# Patient Record
Sex: Female | Born: 1955 | Race: Black or African American | Hispanic: No | Smoking: Never smoker
Health system: Southern US, Community
[De-identification: ages and names within clinical notes are randomized; demographics above are authoritative.]

## PROBLEM LIST (undated history)

## (undated) DIAGNOSIS — K219 Gastro-esophageal reflux disease without esophagitis: Secondary | ICD-10-CM

## (undated) DIAGNOSIS — G4733 Obstructive sleep apnea (adult) (pediatric): Secondary | ICD-10-CM

## (undated) DIAGNOSIS — I10 Essential (primary) hypertension: Secondary | ICD-10-CM

## (undated) DIAGNOSIS — E119 Type 2 diabetes mellitus without complications: Secondary | ICD-10-CM

## (undated) HISTORY — DX: Essential (primary) hypertension: I10

## (undated) HISTORY — DX: Gastro-esophageal reflux disease without esophagitis: K21.9

## (undated) HISTORY — PX: ABDOMINAL HYSTERECTOMY: SHX81

## (undated) HISTORY — PX: TONSILECTOMY, ADENOIDECTOMY, BILATERAL MYRINGOTOMY AND TUBES: SHX2538

## (undated) HISTORY — DX: Type 2 diabetes mellitus without complications: E11.9

## (undated) HISTORY — DX: Obstructive sleep apnea (adult) (pediatric): G47.33

---

## 1999-08-16 ENCOUNTER — Emergency Department (HOSPITAL_COMMUNITY): Admission: EM | Admit: 1999-08-16 | Discharge: 1999-08-16 | Payer: Self-pay | Admitting: Emergency Medicine

## 2006-03-01 ENCOUNTER — Emergency Department (HOSPITAL_COMMUNITY): Admission: EM | Admit: 2006-03-01 | Discharge: 2006-03-01 | Payer: Self-pay | Admitting: Emergency Medicine

## 2006-03-01 ENCOUNTER — Emergency Department (HOSPITAL_COMMUNITY): Admission: EM | Admit: 2006-03-01 | Discharge: 2006-03-02 | Payer: Self-pay | Admitting: Emergency Medicine

## 2006-03-07 ENCOUNTER — Ambulatory Visit (HOSPITAL_COMMUNITY): Admission: RE | Admit: 2006-03-07 | Discharge: 2006-03-07 | Payer: Self-pay | Admitting: Orthopedic Surgery

## 2006-04-08 ENCOUNTER — Emergency Department (HOSPITAL_COMMUNITY): Admission: EM | Admit: 2006-04-08 | Discharge: 2006-04-08 | Payer: Self-pay | Admitting: Family Medicine

## 2007-10-16 ENCOUNTER — Encounter: Admission: RE | Admit: 2007-10-16 | Discharge: 2007-10-16 | Payer: Self-pay | Admitting: Obstetrics and Gynecology

## 2007-11-06 ENCOUNTER — Encounter: Payer: Self-pay | Admitting: Pulmonary Disease

## 2009-09-15 ENCOUNTER — Ambulatory Visit: Payer: Self-pay | Admitting: Internal Medicine

## 2009-09-15 ENCOUNTER — Observation Stay (HOSPITAL_COMMUNITY): Admission: EM | Admit: 2009-09-15 | Discharge: 2009-09-19 | Payer: Self-pay | Admitting: Emergency Medicine

## 2009-09-17 ENCOUNTER — Encounter (INDEPENDENT_AMBULATORY_CARE_PROVIDER_SITE_OTHER): Payer: Self-pay | Admitting: Internal Medicine

## 2009-09-17 ENCOUNTER — Ambulatory Visit: Payer: Self-pay | Admitting: Vascular Surgery

## 2009-10-10 ENCOUNTER — Ambulatory Visit: Payer: Self-pay | Admitting: Pulmonary Disease

## 2009-10-10 DIAGNOSIS — G4730 Sleep apnea, unspecified: Secondary | ICD-10-CM | POA: Insufficient documentation

## 2009-10-10 HISTORY — DX: Sleep apnea, unspecified: G47.30

## 2009-11-07 ENCOUNTER — Encounter: Payer: Self-pay | Admitting: Pulmonary Disease

## 2009-11-08 ENCOUNTER — Ambulatory Visit: Payer: Self-pay | Admitting: Pulmonary Disease

## 2009-11-24 ENCOUNTER — Encounter: Payer: Self-pay | Admitting: Internal Medicine

## 2009-11-24 ENCOUNTER — Ambulatory Visit (HOSPITAL_BASED_OUTPATIENT_CLINIC_OR_DEPARTMENT_OTHER): Admission: RE | Admit: 2009-11-24 | Discharge: 2009-11-24 | Payer: Self-pay | Admitting: Pulmonary Disease

## 2009-12-02 ENCOUNTER — Ambulatory Visit: Payer: Self-pay | Admitting: Internal Medicine

## 2010-06-21 ENCOUNTER — Telehealth: Payer: Self-pay | Admitting: Pulmonary Disease

## 2010-08-14 ENCOUNTER — Ambulatory Visit: Admit: 2010-08-14 | Payer: Self-pay | Admitting: Pulmonary Disease

## 2010-08-14 NOTE — Assessment & Plan Note (Signed)
Summary: SLEEP CONSULT/RJC   Visit Type:  Initial Consult Copy to:  Hospital doctor Primary Provider/Referring Provider:  Dr. Lelon Perla  CC:  Joan [name] here for sleep consult.  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 She underwent PSG in 4/09 due to non restorative sleep & witnessed apneas , wt 311 lbs which showed 7 OAs & 5 hypopneas over a TST of 373 mins with AHI of 1.9, RDI 2.7  & REM RDI of 8/h, nadir desaturation of 80%. She was admitted 3/4-09/19/09 for chest pain, negative treadmill stress test & found to have high sugars. She has gained 15 lbs in the last 2 yrs & now weighs 326 lbs. She was discharged on CPAP &s tates that she has a good experience with this. Shewas able to use it for 8 hrs one night & woke up refreshed.She underwent surgery for adenoids & her turbinates in 1989. 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 Bedtime is 11 p- 12mN, latency is 30 mins, TV stays on, wakes up once for BR visit, wakes up at 6 am feeling refreshed, no headaches, c/o dryness. CPAP has helped. Epworth Sleepiness Score is 2 . There is no history suggestive of cataplexy, sleep paralysis or parasomnias    Preventive Screening-Counseling & Management  Alcohol-Tobacco     Smoking Status: never   History of Present Illness: Sleep Apnea  What time do you typically go to bed?(between what hours): 11:00 or 12:00  How long does it take you to fall asleep? 30 mins to 1 hour  How many times during the night do you wake up? 1 time sometimes to use the restroom  What time do you get out of bed to start your day? 5:30 to 6:00am  Do you drive or operate heavy machinery in your occupation? a school bus  How much has your weight changed (up or down) over the past two years? (in pounds): gained weight in the past, lost 5 lbs a week ago  Have you ever had a sleep study before?  If yes,when and where: [facility]  Do you currently use CPAP ? If so , at what pressure? yes 4.0-12.0  Do you wear oxygen at any time? If yes, how many liters per minute? no Current Medications (verified): 1)  Aspirin 81 Mg  Tabs (Aspirin) .... Take 1 Tablet By Mouth Once A Day 2)  Vytorin 10-40 Mg Tabs (Ezetimibe-Simvastatin) .... Take 1 Tablet By Mouth Once A Day 3)  Hydralazine Hcl 25 Mg Tabs (Hydralazine Hcl) .... Take 1 Tablet By Mouth Three Times A Day 4)  Lisinopril-Hydrochlorothiazide 20-12.5 Mg Tabs (Lisinopril-Hydrochlorothiazide) .... 40/25mg  Once Daily 5)  Biotin Otc .... Take 1 Tablet By Mouth Once A Day 6)  Integra Plus  Caps (Fefum-Fepoly-Fa-B Cmp-C-Biot) .... Take 1 Tablet By Mouth Once A Day 7)  Naprosyn 250 Mg Tabs (Naproxen) .... Take 1 Tablet By Mouth Two Times A Day As Needed 8)  Nexium 40 Mg Cpdr (Esomeprazole Magnesium) .... Take 1 Tablet By Mouth Once A Day 9)  Pre-Natal Formula  Tabs (Prenatal Multivit-Min-Fe-Fa) .... Take 1 Tablet By Mouth Once A Day 10)  Vitamin B-12 1000 Mcg Tabs (Cyanocobalamin) .... Take 1 Tablet By Mouth Once A Day 11)  Vitamin D (Ergocalciferol) 50000 Unit Caps (Ergocalciferol) .... One Tab By Mouth Tuesday and Friday  Allergies (verified): 1)  ! Pcn  Past History:  Family History:  Last updated: 10/10/2009 Heart disease-mother, father Family History Rheumatoid Arthritis-mother Family History Prostate Cancer-father, maternal grandfather  Social History: Last updated: 10/10/2009 Marital Status: Single, lives with Peyton Najjar Frontis as a live in caretaker Children: Yes Occupation: CNA-1 and Surveyor, mining Patient never smoked.   Family History: Heart disease-mother, father Family History Rheumatoid Arthritis-mother Family History Prostate Cancer-father, maternal grandfather  Social History: Marital Status: Single, lives with Peyton Najjar Frontis as a live in caretaker Children: Yes Occupation: CNA-1 and Surveyor, mining Patient never smoked.  Smoking Status:   never  Review of Systems       The patient complains of shortness of breath with activity, acid heartburn, indigestion, tooth/dental problems, headaches, and hand/feet swelling.  The patient denies shortness of breath at rest, productive cough, non-productive cough, coughing up blood, chest pain, irregular heartbeats, loss of appetite, weight change, abdominal pain, difficulty swallowing, sore throat, nasal congestion/difficulty breathing through nose, sneezing, itching, ear ache, anxiety, depression, joint stiffness or pain, rash, change in color of mucus, and fever.    Vital Signs:  Patient profile:   55 year old female Height:      66 inches Weight:      326.25 pounds BMI:     52.85 O2 Sat:      97 % on Room air Temp:     97.7 degrees F oral Pulse rate:   71 / minute BP sitting:   126 / 80  (left arm) Cuff size:   large  Vitals Entered By: Zackery Barefoot CMA (October 10, 2009 9:56 AM)  O2 Flow:  Room air CC: Joan [name] here for sleep consult Comments Medications reviewed with patient Verified contact number and pharmacy with patient Zackery Barefoot CMA  October 10, 2009 9:56 AM    Physical Exam  Additional Exam:  Gen. Pleasant, well-nourished, in no distress, normal affect ENT - no lesions, no post nasal drip, class 3 airway Neck: No JVD, no thyromegaly, no carotid bruits Lungs: no use of accessory muscles, no dullness to percussion, clear without rales or rhonchi  Cardiovascular: Rhythm regular, heart sounds  normal, no murmurs or gallops, no peripheral edema Abdomen: soft and non-tender, no hepatosplenomegaly, BS normal. Musculoskeletal: No deformities, no cyanosis or clubbing Neuro:  alert, non focal     Impression & Recommendations:  Problem # 1:  OBSTRUCTIVE SLEEP APNEA (ICD-780.57) The pathophysiology of obstructive sleep apnea, it's cardiovascular consequences and modes of treatment including CPAP were discussed with the patient in great detail.  Reviewed pSG - does not  show significant events ,surprisingly - ? lack of supine REM sleep. Proceed with split study given weight gain. Also given occupation will need convincing eveidence. Orders: Sleep Disorder Referral (Sleep Disorder) DME Referral (DME) Consultation Level III (60454)  Medications Added to Medication List This Visit: 1)  Aspirin 81 Mg Tabs (Aspirin) .... Take 1 tablet by mouth once a day 2)  Vytorin 10-40 Mg Tabs (Ezetimibe-simvastatin) .... Take 1 tablet by mouth once a day 3)  Hydralazine Hcl 25 Mg Tabs (Hydralazine hcl) .... Take 1 tablet by mouth three times a day 4)  Lisinopril-hydrochlorothiazide 20-12.5 Mg Tabs (Lisinopril-hydrochlorothiazide) .... 40/25mg  once daily 5)  Biotin Otc  .... Take 1 tablet by mouth once a day 6)  Integra Plus Caps (Fefum-fepoly-fa-b cmp-c-biot) .... Take 1 tablet by mouth once a day 7)  Naprosyn 250 Mg Tabs (Naproxen) .... Take 1 tablet by mouth two times a day as needed 8)  Nexium 40 Mg Cpdr (Esomeprazole magnesium) .... Take 1  tablet by mouth once a day 9)  Pre-natal Formula Tabs (Prenatal multivit-min-fe-fa) .... Take 1 tablet by mouth once a day 10)  Vitamin B-12 1000 Mcg Tabs (Cyanocobalamin) .... Take 1 tablet by mouth once a day 11)  Vitamin D (ergocalciferol) 50000 Unit Caps (Ergocalciferol) .... One tab by mouth tuesday and friday  Patient Instructions: 1)  Copy sent to: Dr Allyne Gee 2)  Please schedule a follow-up appointment in 4 weeks. 3)  Keep using your CPAP machine in the meantime

## 2010-08-14 NOTE — Medication Information (Signed)
Summary: CPAP Compliance  CPAP Compliance   Imported By: Sherian Rein 11/15/2009 08:45:56  _____________________________________________________________________  External Attachment:    Type:   Image     Comment:   External Document

## 2010-08-14 NOTE — Assessment & Plan Note (Signed)
Summary: rov 4 wks ///kp   Visit Type:  Follow-up Copy to:  Hospital doctor Primary Provider/Referring Provider:  Dr. 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 She underwent PSG in 4/09 due to non restorative sleep & witnessed apneas , wt 311 lbs which showed 7 OAs & 5 hypopneas over a TST of 373 mins with AHI of 1.9, RDI 2.7  & REM RDI of 8/h, nadir desaturation of 80%. She was admitted 3/4-09/19/09 for chest pain, negative treadmill stress test & found to have high sugars. She has gained 15 lbs in the last 2 yrs & now weighs 326 lbs. She was discharged on CPAP &s tates that she has a good experience with this. Shewas able to use it for 8 hrs one night & woke up refreshed.She underwent surgery for adenoids & her turbinates in 1989. 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 Bedtime is 11 p- 12mN, latency is 30 mins, TV stays on, wakes up once for BR visit, wakes up at 6 am feeling refreshed, no headaches, c/o dryness. CPAP has helped. Epworth Sleepiness Score is 2 .  November 08, 2009 9:39 AM  reviewed download 4/6-26/11poor compliance , bedtime remains late, residual AHI 5/h acceptable Epworth Sleepiness Score 3, sleep study was rescheduled Feels better when using CPAP, more rested , not as tired in afternoons lost wt from 328  to 320 lbs    Current Medications (verified): 1)  Aspirin 81 Mg  Tabs (Aspirin) .... Take 1 Tablet By Mouth Once A Day 2)  Vytorin 10-40 Mg Tabs (Ezetimibe-Simvastatin) .... Take 1 Tablet By Mouth Once A Day 3)  Hydralazine Hcl 25 Mg Tabs (Hydralazine Hcl) .... Take 1 Tablet By Mouth Three Times A Day 4)  Lisinopril-Hydrochlorothiazide 20-12.5 Mg Tabs (Lisinopril-Hydrochlorothiazide) .... 40/25mg  Once Daily 5)  Biotin Otc .... Take 1 Tablet By Mouth Once A Day 6)  Integra Plus  Caps (Fefum-Fepoly-Fa-B Cmp-C-Biot) .... Take 1 Tablet By Mouth Once A Day 7)  Naprosyn 250 Mg Tabs  (Naproxen) .... Take 1 Tablet By Mouth Two Times A Day As Needed 8)  Nexium 40 Mg Cpdr (Esomeprazole Magnesium) .... Take 1 Tablet By Mouth Once A Day 9)  Pre-Natal Formula  Tabs (Prenatal Multivit-Min-Fe-Fa) .... Take 1 Tablet By Mouth Once A Day 10)  Vitamin B-12 1000 Mcg Tabs (Cyanocobalamin) .... Take 1 Tablet By Mouth Once A Day 11)  Vitamin D (Ergocalciferol) 50000 Unit Caps (Ergocalciferol) .... One Tab By Mouth Tuesday and Friday 12)  Jamument .... Take 1 Tab By Mouth At Bedtime  Allergies (verified): 1)  ! 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   Review of Systems  The patient denies anorexia, fever, weight loss, weight gain, vision loss, decreased hearing, hoarseness, chest pain, syncope, dyspnea on exertion, peripheral edema, prolonged cough, headaches, hemoptysis, abdominal pain, melena, hematochezia, severe indigestion/heartburn, hematuria, muscle weakness, suspicious skin lesions, difficulty walking, depression, unusual weight change, and abnormal bleeding.    Vital Signs:  Patient profile:   55 year old female Height:      66 inches Weight:      320.38 pounds O2 Sat:  97 % on Room air Temp:     97.8 degrees F oral Pulse rate:   90 / minute BP sitting:   116 / 72  (left arm) Cuff size:   large  Vitals Entered By: Carver Fila SMA (November 08, 2009 9:26 AM)  O2 Flow:  Room air Comments Meds and allergies updated Carver Fila SMA  November 08, 2009 9:31 AM    Physical Exam  Additional Exam:  Gen. Pleasant, well-nourished, in no distress, normal affect ENT - no lesions, no post nasal drip, class 3 airway Neck: No JVD, no  thyromegaly, no carotid bruits Lungs: no use of accessory muscles, no dullness to percussion, clear without rales or rhonchi  Cardiovascular: Rhythm regular, heart sounds  normal, no murmurs or gallops, no peripheral edema Musculoskeletal: No deformities, no cyanosis or clubbing     Impression & Recommendations:  Problem # 1:  OBSTRUCTIVE SLEEP APNEA (ICD-780.57) Reschedule split PSG, meanwhile ct CPAP 12 cm, residual AHI acceptable Compliance encouraged, wt loss emphasized, asked to avoid meds with sedative side effects, cautioned against driving when sleepy.  I discussed that the responsibility of ensuring that her sleepiness/ fatigue is well controlled on a daily basis lies with her as a driver.Using CPAP wil certainly help with this. Orders: Est. Patient Level III (18841) Sleep Disorder Referral (Sleep Disorder)  Medications Added to Medication List This Visit: 1)  Jamument  .... Take 1 tab by mouth at bedtime  Patient Instructions: 1)  Copy sent to: Dr Lelon Perla 2)  Please schedule a follow-up appointment in 6 weeks. 3)  Reschedule sleep study today  4)  I am sure you will be more diligent about using your CPAP machine every night , at least 4 -6 hours

## 2010-08-14 NOTE — Progress Notes (Signed)
Summary: needs f/u appt   Phone Note Call from Patient Call back at Home Phone [phone]   Caller: Patient Call For: Circe Chilton Summary of Call: pt wants to make a f/u appt w/ RA since she hasn't been seen in a while. (pt states she's not having any problems). 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 pls advise.   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 Pt ok withthis location so appt set for 10:30 on 08-14-09. Appt reminder mailed to pt. Carron Curie CMA  June 21, 2010 12:06 PM

## 2010-09-03 ENCOUNTER — Ambulatory Visit: Payer: Self-pay | Admitting: Pulmonary Disease

## 2010-09-04 ENCOUNTER — Telehealth: Payer: Self-pay | Admitting: Pulmonary Disease

## 2010-09-11 NOTE — Progress Notes (Signed)
Summary: nos appt  Phone Note Call from Patient   Caller: [email or web address]  Call For: Kasidee Voisin Summary of Call: [facility]sc nos from 2/20 to 3/26. Initial call taken by: Darletta Moll,  September 04, 2010 10:19 AM

## 2010-10-03 ENCOUNTER — Encounter: Payer: Self-pay | Admitting: Pulmonary Disease

## 2010-10-07 LAB — BASIC METABOLIC PANEL
BUN: 14 mg/dL (ref 6–23)
CO2: 26 mEq/L (ref 19–32)
Calcium: 9.2 mg/dL (ref 8.4–10.5)
Chloride: 106 mEq/L (ref 96–112)
Creatinine, Ser: 0.71 mg/dL (ref 0.4–1.2)
GFR calc Af Amer: >60 mL/min (ref >60)
GFR calc non Af Amer: >60 mL/min (ref >60)
Glucose, Bld: 91 mg/dL (ref 70–99)
Potassium: 3.5 mEq/L (ref 3.5–5.1)
Sodium: 141 mEq/L (ref 135–145)

## 2010-10-07 LAB — CK TOTAL AND CKMB (NOT AT ARMC)
CK, MB: 1.2 ng/mL (ref 0.3–4.0)
CK, MB: 1.3 ng/mL (ref 0.3–4.0)
CK, MB: 1.4 ng/mL (ref 0.3–4.0)
CK, MB: 1.4 ng/mL (ref 0.3–4.0)
Relative Index: 1.2 (ref 0.0–2.5)
Relative Index: 1.3 (ref 0.0–2.5)
Relative Index: 1.3 (ref 0.0–2.5)
Relative Index: 1.4 (ref 0.0–2.5)
Total CK: 101 U/L (ref 7–177)
Total CK: 101 U/L (ref 7–177)
Total CK: 102 U/L (ref 7–177)
Total CK: 112 U/L (ref 7–177)

## 2010-10-07 LAB — GLUCOSE, CAPILLARY
Glucose-Capillary: 100 mg/dL — ABNORMAL HIGH (ref 70–99)
Glucose-Capillary: 101 mg/dL — ABNORMAL HIGH (ref 70–99)
Glucose-Capillary: 104 mg/dL — ABNORMAL HIGH (ref 70–99)
Glucose-Capillary: 105 mg/dL — ABNORMAL HIGH (ref 70–99)
Glucose-Capillary: 108 mg/dL — ABNORMAL HIGH (ref 70–99)
Glucose-Capillary: 111 mg/dL — ABNORMAL HIGH (ref 70–99)
Glucose-Capillary: 114 mg/dL — ABNORMAL HIGH (ref 70–99)
Glucose-Capillary: 119 mg/dL — ABNORMAL HIGH (ref 70–99)
Glucose-Capillary: 121 mg/dL — ABNORMAL HIGH (ref 70–99)
Glucose-Capillary: 123 mg/dL — ABNORMAL HIGH (ref 70–99)
Glucose-Capillary: 133 mg/dL — ABNORMAL HIGH (ref 70–99)
Glucose-Capillary: 75 mg/dL (ref 70–99)
Glucose-Capillary: 81 mg/dL (ref 70–99)
Glucose-Capillary: 89 mg/dL (ref 70–99)
Glucose-Capillary: 92 mg/dL (ref 70–99)
Glucose-Capillary: 99 mg/dL (ref 70–99)

## 2010-10-07 LAB — TROPONIN I
Troponin I: 0.01 ng/mL (ref 0.00–0.06)
Troponin I: 0.03 ng/mL (ref 0.00–0.06)
Troponin I: 0.03 ng/mL (ref 0.00–0.06)

## 2010-10-07 LAB — DIFFERENTIAL
Basophils Absolute: 0 10*3/uL (ref 0.0–0.1)
Basophils Relative: 1 % (ref 0–1)
Eosinophils Absolute: 0.1 10*3/uL (ref 0.0–0.7)
Eosinophils Relative: 2 % (ref 0–5)
Lymphocytes Relative: 30 % (ref 12–46)
Lymphs Abs: 1.9 10*3/uL (ref 0.7–4.0)
Monocytes Absolute: 0.5 10*3/uL (ref 0.1–1.0)
Monocytes Relative: 8 % (ref 3–12)
Neutro Abs: 3.6 10*3/uL (ref 1.7–7.7)
Neutrophils Relative %: 59 % (ref 43–77)

## 2010-10-07 LAB — IRON AND TIBC
Iron: 36 ug/dL — ABNORMAL LOW (ref 42–135)
Saturation Ratios: 15 % — ABNORMAL LOW (ref 20–55)
TIBC: 237 ug/dL — ABNORMAL LOW (ref 250–470)
UIBC: 201 ug/dL

## 2010-10-07 LAB — HEMOGLOBIN A1C
Hgb A1c MFr Bld: 6.7 % — ABNORMAL HIGH (ref 4.6–6.1)
Mean Plasma Glucose: 146 mg/dL

## 2010-10-07 LAB — RETICULOCYTES
RBC.: 3.91 MIL/uL (ref 3.87–5.11)
Retic Count, Absolute: 50.8 10*3/uL (ref 19.0–186.0)
Retic Ct Pct: 1.3 % (ref 0.4–3.1)

## 2010-10-07 LAB — POCT CARDIAC MARKERS
CKMB, poc: 1.1 ng/mL (ref 1.0–8.0)
CKMB, poc: 1.2 ng/mL (ref 1.0–8.0)
Myoglobin, poc: 104 ng/mL (ref 12–200)
Myoglobin, poc: 73.4 ng/mL (ref 12–200)
Troponin i, poc: <0.05 ng/mL (ref 0.00–0.09)
Troponin i, poc: <0.05 ng/mL (ref 0.00–0.09)

## 2010-10-07 LAB — D-DIMER, QUANTITATIVE: D-Dimer, Quant: 0.52 ug/mL-FEU — ABNORMAL HIGH (ref 0.00–0.48)

## 2010-10-07 LAB — LIPID PANEL
Cholesterol: 196 mg/dL (ref 0–200)
HDL: 48 mg/dL (ref >39)
LDL Cholesterol: 138 mg/dL — ABNORMAL HIGH (ref 0–99)
Total CHOL/HDL Ratio: 4.1 RATIO
Triglycerides: 50 mg/dL (ref <150)
VLDL: 10 mg/dL (ref 0–40)

## 2010-10-07 LAB — CBC
HCT: 32.3 % — ABNORMAL LOW (ref 36.0–46.0)
Hemoglobin: 10.9 g/dL — ABNORMAL LOW (ref 12.0–15.0)
MCHC: 33.8 g/dL (ref 30.0–36.0)
MCV: 83.2 fL (ref 78.0–100.0)
Platelets: 215 10*3/uL (ref 150–400)
RBC: 3.88 MIL/uL (ref 3.87–5.11)
RDW: 13.5 % (ref 11.5–15.5)
WBC: 6.1 10*3/uL (ref 4.0–10.5)

## 2010-10-07 LAB — TSH: TSH: 1.082 u[IU]/mL (ref 0.350–4.500)

## 2010-10-07 LAB — FERRITIN: Ferritin: 169 ng/mL (ref 10–291)

## 2010-10-07 LAB — FOLATE: Folate: 18.5 ng/mL

## 2010-10-07 LAB — VITAMIN B12: Vitamin B-12: 573 pg/mL (ref 211–911)

## 2010-10-07 LAB — BRAIN NATRIURETIC PEPTIDE: Pro B Natriuretic peptide (BNP): <30 pg/mL (ref 0.0–100.0)

## 2010-10-08 ENCOUNTER — Ambulatory Visit: Payer: Self-pay | Admitting: Pulmonary Disease

## 2010-11-30 NOTE — Op Note (Signed)
NAME:  Joan [name], Joan [name]            ACCOUNT NO.:  [id number]   MEDICAL RECORD NO.:  [id number]          PATIENT TYPE:  AMB   LOCATION:  SDS                          FACILITY:  [facility]   PHYSICIAN:  Artist Pais. Weingold, M.D.DATE OF BIRTH:  Mar 11, 1956   DATE OF PROCEDURE:  DATE OF DISCHARGE:  03/07/2006                                 OPERATIVE REPORT   PREOPERATIVE DIAGNOSES:  Left distal radius fracture with carpal tunnel  syndrome.   POSTOPERATIVE DIAGNOSES:  Left distal radius fracture with carpal tunnel  syndrome.   PROCEDURE:  Open reduction and internal fixation with DVR plate and screw  and left carpal tunnel release through separate incision.   SURGEON:  Dairl Ponder, M.D.   ASSISTANT:  None.   ANESTHESIA:  General.   TOURNIQUET TIME:  55 minutes   COMPLICATIONS:  None.   DRAINS:  None.   OPERATIVE REPORT:  Patient was taken to the operating room after the  induction of adequate general anesthesia.  Left upper extremity is prepped  and draped in the sterile fashion.  An Esmarch was used to exsanguinate the  limb.  Tourniquet was then inflated to 250 mmHg.  At this point in time, a  longitudinal incision was made at the flexed carpal radialis tendon.  The  skin was incised.  Sheath over the FCR was incised.  The FCR was retracted  to the midline of the radial artery of the lateral side.  The fascia  underlying this was incised.  Dissection was carried out level of the  pronator quadratus.  Once this was done, the pronator quadratus was  subperiosteally stripped off the distal radius exposing a comminuted  minimally displaced intra-articular fracture of the distal radius.  It was  reduced after the fracture fragments were realigned and clot debrided from  the fracture site.  The DVR plate was then fastened to the lower aspect of  the distal radius, fixed through the slotted whole in the plate with a  cortical screw.  Intraoperative fluoroscopy revealed adequate  reduction in  both the AP, lateral and oblique view.  The remaining cortical screws were  filled proximally and the seven smooth pegs distally under direct and  fluoroscopic guidance.  At the end of the procedure, fluoroscopy revealed  anatomic reduction on both the AP, lateral and oblique view.  The wound was  irrigated and loosely closed in layers with 0 Vicryl for the quadratus and 3-  0 Prolene subcuticular stitch on the skin.  A second incision was made in  the palmar aspect of the left hand in the long thumb metacarpal.  Starting  in Kaplan's cardinal line, skin was incised.  A palmar fascia was identified  and split.  The distal edge of the transverse carpal ligament was identified  and split with a 15-blade.  The medial nerve was identified, protected and  freed with the elevator.  The main aspects of the transverse carpal ligament  were divided under direct vision using  curved blunt scissors.  The canal was inspected.  There were no osseous  lesions or gangrenous lesions present, and it was  irrigated and loosely  closed with 3-0 Prolene subcuticular stitch.  Steri-Strips, 4x4's, fluffs,  and a volar splint were applied.  Patient tolerated the procedure well and  went to recovery in stable fashion.      Artist Pais Mina Marble, M.D.  Electronically Signed     MAW/MEDQ  D:  03/07/2006  T:  03/08/2006  Job:  [id number]

## 2011-01-29 ENCOUNTER — Other Ambulatory Visit: Payer: Self-pay | Admitting: Obstetrics and Gynecology

## 2011-01-29 DIAGNOSIS — N63 Unspecified lump in unspecified breast: Secondary | ICD-10-CM

## 2011-02-01 ENCOUNTER — Other Ambulatory Visit: Payer: Self-pay

## 2011-02-08 ENCOUNTER — Other Ambulatory Visit: Payer: Self-pay

## 2011-06-20 ENCOUNTER — Ambulatory Visit: Payer: Self-pay | Admitting: Pulmonary Disease

## 2011-07-18 ENCOUNTER — Encounter: Payer: Self-pay | Admitting: Pulmonary Disease

## 2011-07-18 ENCOUNTER — Ambulatory Visit (INDEPENDENT_AMBULATORY_CARE_PROVIDER_SITE_OTHER): Payer: Self-pay | Admitting: Pulmonary Disease

## 2011-07-18 VITALS — BP 110/70 | HR 74 | Temp 98.1°F | Ht 66.0 in | Wt 323.0 lb

## 2011-07-18 DIAGNOSIS — G4730 Sleep apnea, unspecified: Secondary | ICD-10-CM

## 2011-07-18 DIAGNOSIS — Z23 Encounter for immunization: Secondary | ICD-10-CM

## 2011-07-18 NOTE — Progress Notes (Signed)
  Subjective:    Patient ID: Joan [name], female    DOB: June 12, 1956, 56 y.o.   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 She underwent PSG in 4/09 due to non restorative sleep & witnessed apneas , wt 311 lbs which showed 7 OAs & 5 hypopneas over a TST of 373 mins with AHI of 1.9, RDI 2.7  & REM RDI of 8/h, nadir desaturation of 80%. She was admitted 3/4-09/19/09 for chest pain, negative treadmill stress test & found to have high sugars. She was discharged on CPAP.She underwent surgery for adenoids & her turbinates in 1989. 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 Wt 328 Bedtime is 11 p- 12mN, latency is 30 mins, TV stays on, wakes up once for BR visit, wakes up at 6 am feeling refreshed, no headaches, c/o dryness. CPAP has helped. Epworth Sleepiness Score is 2 .  November 08, 2009  -wt 320 reviewed download 4/6-26/11poor compliance , bedtime remains late, residual AHI 5/h acceptable Epworth Sleepiness Score 3 Feels better when using CPAP, more rested , not as tired in afternoons PSG repeated 5/11 >> mild OSA, predominant RERAs, RDI 13/h - CPAP not applied  07/18/2011 wt - 323 lbs Admits to poor compliance with CPAP - 'So much going on' Denies sleepiness while driving, just got a new mask, pressure ok No dryness. Admits to poor compliance with diabetes pills.    Review of Systems Patient denies significant dyspnea,cough, hemoptysis,  chest pain, palpitations, pedal edema, orthopnea, paroxysmal nocturnal dyspnea, lightheadedness, nausea, vomiting, abdominal or  leg pains      Objective:   Physical Exam Gen. Pleasant, obese, in no distress ENT - no lesions, no post nasal drip Neck: No JVD, no thyromegaly, no carotid bruits Lungs: no use of accessory muscles, no dullness to percussion, decreased without rales or rhonchi  Cardiovascular: Rhythm regular, heart sounds  normal, no murmurs or gallops, no peripheral  edema Musculoskeletal: No deformities, no cyanosis or clubbing , no tremors        Assessment & Plan:

## 2011-07-18 NOTE — Patient Instructions (Signed)
It is your responsibility to use your CPAP machine Your sleepiness may vary on a daily basis & you are the best judge of your ability to drive We will check download on your machine in 1  month

## 2011-07-18 NOTE — Assessment & Plan Note (Addendum)
She has only mild obstructive sleep apnea by PSG 5/11 & does not seem to have excessive daytime somnolence by history. It is your responsibility to use your CPAP machine Your sleepiness may vary on a daily basis & you are the best judge of your ability to drive We will check download on your machine in 1  Month Weight loss encouraged, compliance with goal of at least 4-6 hrs every night is the expectation. Advised against medications with sedative side effects Cautioned against driving when sleepy - understanding that sleepiness will vary on a day to day basis

## 2012-01-30 ENCOUNTER — Encounter: Payer: Self-pay | Admitting: Pulmonary Disease

## 2012-01-30 ENCOUNTER — Ambulatory Visit (INDEPENDENT_AMBULATORY_CARE_PROVIDER_SITE_OTHER): Payer: BC Managed Care – PPO | Admitting: Pulmonary Disease

## 2012-01-30 VITALS — BP 132/76 | HR 81 | Temp 98.0°F | Ht 66.0 in | Wt 328.0 lb

## 2012-01-30 DIAGNOSIS — G4730 Sleep apnea, unspecified: Secondary | ICD-10-CM

## 2012-01-30 NOTE — Assessment & Plan Note (Addendum)
Trial of nasal spray -nasonex - each nare at bedtime We will change to auto cpap 5-12, mask seems ok Weight loss encouraged,CPAP  compliance with goal of at least 4-6 hrs every night is the expectation. Caution against driving when sleepy - understanding that sleepiness will vary on a day to day basis Maybe we should explore oral appliance if she cannot tolerate cpap

## 2012-01-30 NOTE — Progress Notes (Signed)
  Subjective:    Patient ID: Joan [name], female    DOB: [birth year], 56 y.o.   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  She underwent PSG in 4/09 due to non restorative sleep & witnessed apneas , wt 311 lbs which showed 7 OAs & 5 hypopneas over a TST of 373 mins with AHI of 1.9, RDI 2.7 & REM RDI of 8/h, nadir desaturation of 80%.  She was admitted 3/4-09/19/09 for chest pain, negative treadmill stress test & found to have high sugars. She was discharged on CPAP.She underwent surgery for adenoids & her turbinates in 1989. 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 Bedtime is 11 p- 12mN, latency is 30 mins, TV stays on, wakes up once for BR visit, wakes up at 6 am feeling refreshed, no headaches, c/o dryness  download 4/6-26/11poor compliance , bedtime remains late, residual AHI 5/h acceptable  PSG repeated 5/11 >> mild OSA, predominant RERAs, RDI 13/h - CPAP not applied    01/30/2012 75m FU Download shows poor compliance Patient states is unable to wear cpap every night due to pressure. States the pressure is causing chest pain and a little chest tightness School is off now, doe snot sleep till late, can nap in daytime Husband notes loud snoring She is having menopausal symptoms Has gained about 10 lbs since last visit   Review of Systems Patient denies significant dyspnea,cough, hemoptysis,  chest pain, palpitations, pedal edema, orthopnea, paroxysmal nocturnal dyspnea, lightheadedness, nausea, vomiting, abdominal or  leg pains      Objective:   Physical Exam  Gen. Pleasant, obese, in no distress ENT - no lesions, no post nasal drip Neck: No JVD, no thyromegaly, no carotid bruits Lungs: no use of accessory muscles, no dullness to percussion, decreased without rales or rhonchi  Cardiovascular: Rhythm regular, heart sounds  normal, no murmurs or gallops, no peripheral edema Musculoskeletal: No deformities,  no cyanosis or clubbing , no tremors       Assessment & Plan:

## 2012-01-30 NOTE — Patient Instructions (Addendum)
Trial of nasal spray -nasonex - each nare at bedtime We will change to auto settings Weight loss encouraged,CPAP  compliance with goal of at least 4-6 hrs every night is the expectation. Caution against driving when sleepy - understanding that sleepiness will vary on a day to day basis

## 2012-03-05 ENCOUNTER — Telehealth: Payer: Self-pay | Admitting: Pulmonary Disease

## 2012-03-05 NOTE — Telephone Encounter (Signed)
Will forward to RA as FYI 

## 2012-03-05 NOTE — Telephone Encounter (Signed)
This msg per Bjorn Loser (msg faxed to [facility])- Order was sent to [facility] 01-30-12 to change cpap to auto 5-12 cm and dowload in 4 wks. Per caller- pt has not brought unit in for a pressure change. It has been over a month and [facility] has talked to her many times and emailed (pt responded to emails) but has not yet brought machine in to have pressure changed. Hazel Sams

## 2012-04-02 ENCOUNTER — Other Ambulatory Visit: Payer: Self-pay

## 2012-04-02 ENCOUNTER — Encounter: Payer: Self-pay | Admitting: Obstetrics and Gynecology

## 2012-04-15 ENCOUNTER — Ambulatory Visit: Payer: Self-pay | Admitting: Obstetrics and Gynecology

## 2012-05-13 ENCOUNTER — Encounter: Payer: Self-pay | Admitting: Obstetrics and Gynecology

## 2012-05-13 ENCOUNTER — Ambulatory Visit (INDEPENDENT_AMBULATORY_CARE_PROVIDER_SITE_OTHER): Payer: BC Managed Care – PPO | Admitting: Obstetrics and Gynecology

## 2012-05-13 VITALS — BP 124/80 | Ht 66.0 in | Wt 330.0 lb

## 2012-05-13 DIAGNOSIS — Z124 Encounter for screening for malignant neoplasm of cervix: Secondary | ICD-10-CM

## 2012-05-13 NOTE — Patient Instructions (Signed)
Breast Self-Exam A self breast exam may help you find changes or problems while they are still [place]. Do a breast self-exam:  Every month.  One week after your period (menstrual period).  On the first day of each month if you do not have periods anymore. Look for any:  Change in breast color, size, or shape.  Dimples in your breast.  Changes in your nipples or skin.  Dry skin on your breasts or nipples.  Watery or bloody discharge from your nipples.  Feel for:  Lumps.  Thick, hard places.  Any other changes. HOME CARE There are 3 ways to do the breast self-exam: In front of a mirror.  Lift your arms over your head and turn side to side.  Put your hands on your hips and lean down, then turn from side to side.  Bend forward and turn from side to side. In the shower.  With soapy hands, check both breasts. Then check above and below your collarbone and your armpits.  Feel above and below your collarbone down to under your breast, and from the center of your chest to the outer edge of the armpit. Check for any lumps or hard spots.  Using the tips of your middle three fingers check your whole breast by pressing your hand over your breast in a circle or in an up and down motion. Lying down.  Lie flat on your bed.  Put a Roethler pillow under the breast you are going to check. On that same side, put your hand behind your head.  With your other hand, use the 3 middle fingers to feel the breast.  Move your fingers in a circle around the breast. Press firmly over all parts of the breast to feel for any lumps. GET HELP RIGHT AWAY IF: You find any changes in your breasts so they can be checked. Document Released: 12/18/2007 Document Revised: 09/23/2011 Document Reviewed: 10/19/2008 Exit[employer] Patient Information 2013 [employer].  

## 2012-05-13 NOTE — Progress Notes (Signed)
Last Pap: 2012 WNL: Yes Regular Periods:no Contraception: n/a  Monthly Breast exam:yes Tetanus<51yrs:yes Nl.Bladder Function:yes Daily BMs:yes Healthy Diet:yes Calcium:yes Mammogram:no Date of Mammogram: n/a Exercise:no Have often Exercise: occ Seatbelt: yes Abuse at home: no Stressful work:no Sigmoid-colonoscopy: n/a Bone Density: No PCP: Dr. Allyne Gee Change in PMH: NO change Change in FMH:NO change BP 124/80  Ht 5\' 6"  (1.676 m)  Wt 330 lb (149.687 kg)  BMI 53.26 kg/m2 Pt with complaints:no Physical Examination: General appearance - alert, well appearing, and in no distress Mental status - normal mood, behavior, speech, dress, motor activity, and thought processes Neck - supple, no significant adenopathy,  thyroid exam: thyroid is normal in size without nodules or tenderness Chest - clear to auscultation, no wheezes, rales or rhonchi, symmetric air entry Heart - normal rate and regular rhythm Abdomen - soft, nontender, nondistended, no masses or organomegaly Breasts - breasts appear normal, no suspicious masses, no skin or nipple changes or axillary nodes Pelvic - normal external genitalia, vulva, vagina, cervix, uterus and adnexa Rectal - declined by the patient Back exam - full range of motion, no tenderness, palpable spasm or pain on motion Neurological - alert, oriented, normal speech, no focal findings or movement disorder noted Musculoskeletal - no joint tenderness, deformity or swelling Extremities - no edema, redness or tenderness in the calves or thighs Skin - normal coloration and turgor, no rashes, no suspicious skin lesions noted Routine exam Pap sent yes if normal last one Mammogram due yes will schedule  RT 1 yr

## 2012-05-14 LAB — PAP IG W/ RFLX HPV ASCU

## 2012-12-08 ENCOUNTER — Ambulatory Visit (INDEPENDENT_AMBULATORY_CARE_PROVIDER_SITE_OTHER): Payer: BC Managed Care – PPO | Admitting: Pulmonary Disease

## 2012-12-08 ENCOUNTER — Encounter: Payer: Self-pay | Admitting: Pulmonary Disease

## 2012-12-08 VITALS — BP 150/80 | HR 88 | Temp 98.1°F | Ht 65.0 in | Wt 323.2 lb

## 2012-12-08 DIAGNOSIS — G4730 Sleep apnea, unspecified: Secondary | ICD-10-CM

## 2012-12-08 NOTE — Assessment & Plan Note (Addendum)
[facility] will check your CPAP machine & we will give you feedback once we get the report Bariatric program referral since she has been unable to lose wt by diet & exercise - has at least 3 wt related conditions ( Dm, htn & OSA)  Weight loss encouraged, compliance with goal of at least 4-6 hrs every night is the expectation. Advised against medications with sedative side effects Cautioned against driving when sleepy - understanding that sleepiness will vary on a day to day basis

## 2012-12-08 NOTE — Patient Instructions (Signed)
[facility] will check your CPAP machine & we will give you feedback once we get the report Bariatric program referral

## 2012-12-08 NOTE — Progress Notes (Signed)
  Subjective:    Patient ID: Joan [name], female    DOB: 04-Feb-1956, 57 y.o.   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  She underwent PSG in 4/09 due to non restorative sleep & witnessed apneas , wt 311 lbs which showed 7 OAs & 5 hypopneas over a TST of 373 mins with AHI of 1.9, RDI 2.7 & REM RDI of 8/h, nadir desaturation of 80%.  She was admitted 3/4-09/19/09 for chest pain, negative treadmill stress test & found to have high sugars. She was discharged on CPAP.She underwent surgery for adenoids & her turbinates in 1989. 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  Bedtime is 11 p- 12mN, latency is 30 mins, TV stays on, wakes up once for BR visit, wakes up at 6 am feeling refreshed, no headaches, c/o dryness  download 4/6-26/11poor compliance , bedtime remains late, residual AHI 5/h acceptable  PSG repeated 5/11 >> mild OSA, predominant RERAs, RDI 13/h - CPAP not applied  01/30/2012 Download shows poor compliance     12/08/2012 Order was sent to [facility] 01-30-12 to change cpap to auto 5-12 cm and dowload in 4 wks. But pt has not brought unit in for a pressure change. [facility] talked to her many times and emailed (pt responded to emails) but has not yet brought machine in to have pressure changed  pt wearing CPAP "when she remembers  it" -- when she does wear it wear for approx 6hrs per night--  tolerating pressure well- denies any new concerns at this time Husband notes loud snoring  She is having menopausal symptoms  Has gained about 10 lbs since last visit Thought she was 'pre-diabetic', but she is on janu-met Sleeps on 3 pillows & reports some heartburn , on nexium now.  Review of Systems neg for any significant sore throat, dysphagia, itching, sneezing, nasal congestion or excess/ purulent secretions, fever, chills, sweats, unintended wt loss, pleuritic or exertional cp, hempoptysis, orthopnea pnd or change in chronic  leg swelling. Also denies presyncope, palpitations, heartburn, abdominal pain, nausea, vomiting, diarrhea or change in bowel or urinary habits, dysuria,hematuria, rash, arthralgias, visual complaints, headache, numbness weakness or ataxia.     Objective:   Physical Exam  Gen. Pleasant, obese, in no distress ENT - no lesions, no post nasal drip Neck: No JVD, no thyromegaly, no carotid bruits Lungs: no use of accessory muscles, no dullness to percussion, decreased without rales or rhonchi  Cardiovascular: Rhythm regular, heart sounds  normal, no murmurs or gallops, no peripheral edema Musculoskeletal: No deformities, no cyanosis or clubbing , no tremors       Assessment & Plan:

## 2013-04-29 ENCOUNTER — Telehealth: Payer: Self-pay | Admitting: Pulmonary Disease

## 2013-04-29 NOTE — Telephone Encounter (Signed)
I spoke with pt. She reports she has not used her machine x 1 1/2 months. She reports she has began to lose weight 8-10 lbs already. She is still working on this. She reports she has been going to bed on time and does not feel like she is choking at night. Her reflux is better also. She is wanting to work on weight loss more. I advised will forward to RA as an [place]

## 2013-04-30 NOTE — Telephone Encounter (Signed)
Congratulations on wt loss. WOuld suggest rpt home study to see if indeed her Tanna Savoy has improved with wt loss

## 2013-04-30 NOTE — Telephone Encounter (Signed)
Pt reports she is going to weight loss first. She will decided wants she comes in for appt go from there

## 2013-05-17 ENCOUNTER — Other Ambulatory Visit: Payer: Self-pay | Admitting: Obstetrics and Gynecology

## 2013-05-17 DIAGNOSIS — Z1231 Encounter for screening mammogram for malignant neoplasm of breast: Secondary | ICD-10-CM

## 2013-06-14 ENCOUNTER — Ambulatory Visit: Payer: BC Managed Care – PPO | Admitting: Adult Health

## 2013-06-15 ENCOUNTER — Ambulatory Visit: Payer: BC Managed Care – PPO | Admitting: Adult Health

## 2013-06-21 ENCOUNTER — Ambulatory Visit: Payer: BC Managed Care – PPO

## 2013-08-26 ENCOUNTER — Encounter: Payer: Self-pay | Admitting: Pulmonary Disease

## 2013-08-26 ENCOUNTER — Ambulatory Visit (INDEPENDENT_AMBULATORY_CARE_PROVIDER_SITE_OTHER): Payer: BC Managed Care – PPO | Admitting: Pulmonary Disease

## 2013-08-26 VITALS — BP 122/78 | HR 72 | Temp 98.0°F | Wt 326.4 lb

## 2013-08-26 DIAGNOSIS — G4730 Sleep apnea, unspecified: Secondary | ICD-10-CM

## 2013-08-26 NOTE — Progress Notes (Signed)
   Subjective:    Patient ID: Joan [name], female    DOB: 03/05/1956, 58 y.o.   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  She underwent PSG in 4/09 due to non restorative sleep & witnessed apneas , wt 311 lbs which showed 7 OAs & 5 hypopneas over a TST of 373 mins with AHI of 1.9, RDI 2.7 & REM RDI of 8/h, nadir desaturation of 80%.  She was admitted 3/4-09/19/09 for chest pain, negative treadmill stress test & found to have high sugars. She was discharged on CPAP.She underwent surgery for adenoids & her turbinates in 1989. 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  B  download 4/6-26/11poor compliance , bedtime remains late, residual AHI 5/h acceptable  PSG repeated 5/11 >> mild OSA, predominant RERAs, RDI 13/h  01/30/2012 Download shows poor compliance     08/26/2013  Chief Complaint  Patient presents with  . Follow-up    Pt has not used her CPAP x october. she started losing weight but now is gaining back.Not snoring at all now.    Thought she was 'pre-diabetic', but she is on janu-met  Sleeps on 3 pillows & reports some heartburn , on nexium now. She has a pattern of non compliance with CPAP Does not want bariatric surgery Has not been able to lose wt , in fact has gained some   Review of Systems neg for any significant sore throat, dysphagia, itching, sneezing, nasal congestion or excess/ purulent secretions, fever, chills, sweats, unintended wt loss, pleuritic or exertional cp, hempoptysis, orthopnea pnd or change in chronic leg swelling. Also denies presyncope, palpitations, heartburn, abdominal pain, nausea, vomiting, diarrhea or change in bowel or urinary habits, dysuria,hematuria, rash, arthralgias, visual complaints, headache, numbness weakness or ataxia.     Objective:   Physical Exam  Gen. Pleasant, obese, in no distress ENT - no lesions, no post nasal drip Neck: No JVD, no thyromegaly, no  carotid bruits Lungs: no use of accessory muscles, no dullness to percussion, decreased without rales or rhonchi  Cardiovascular: Rhythm regular, heart sounds  normal, no murmurs or gallops, no peripheral edema Musculoskeletal: No deformities, no cyanosis or clubbing , no tremors       Assessment & Plan:

## 2013-08-26 NOTE — Patient Instructions (Signed)
Home sleep test Based on this we can decide if we need a CPAP titration study Weight loss is recommended

## 2013-08-26 NOTE — Assessment & Plan Note (Addendum)
Home sleep test Based on this we can decide if we need a CPAP titration study -perhaps trial a different mask,lower pressure Finally,if she is unable to use CPAP, may have to discontinue this altogether - doubt she is a candidate for oral appliance  Weight loss encouraged, compliance with goal of at least 4-6 hrs every night is the expectation. Advised against medications with sedative side effects Cautioned against driving when sleepy - understanding that sleepiness will vary on a day to day basis

## 2013-10-01 DIAGNOSIS — G4733 Obstructive sleep apnea (adult) (pediatric): Secondary | ICD-10-CM

## 2013-10-08 ENCOUNTER — Telehealth: Payer: Self-pay | Admitting: Pulmonary Disease

## 2013-10-08 NOTE — Telephone Encounter (Signed)
Home study showed moderate OSA - 13/h She has to get back on CPAP Schedule rpt CPAP titration study

## 2013-10-11 DIAGNOSIS — G4733 Obstructive sleep apnea (adult) (pediatric): Secondary | ICD-10-CM

## 2013-10-11 NOTE — Telephone Encounter (Signed)
lmomtcb x1 

## 2013-10-12 NOTE — Telephone Encounter (Signed)
lmtcb x2 

## 2013-10-14 NOTE — Telephone Encounter (Signed)
lmomtcb x 3  

## 2013-10-19 ENCOUNTER — Encounter: Payer: Self-pay | Admitting: *Deleted

## 2013-10-19 NOTE — Telephone Encounter (Signed)
lmtcb x4. Will send letter.  

## 2014-06-03 ENCOUNTER — Ambulatory Visit: Payer: BC Managed Care – PPO | Admitting: Pulmonary Disease

## 2014-07-29 ENCOUNTER — Ambulatory Visit: Payer: BC Managed Care – PPO | Admitting: Pulmonary Disease

## 2016-10-24 ENCOUNTER — Other Ambulatory Visit: Payer: Self-pay | Admitting: Orthopaedic Surgery

## 2016-10-24 DIAGNOSIS — M25511 Pain in right shoulder: Secondary | ICD-10-CM

## 2016-11-09 ENCOUNTER — Ambulatory Visit
Admission: RE | Admit: 2016-11-09 | Discharge: 2016-11-09 | Disposition: A | Discharge status: Home / Self Care | Payer: BC Managed Care – PPO | Source: Ambulatory Visit | Attending: Orthopaedic Surgery | Admitting: Orthopaedic Surgery

## 2016-11-09 DIAGNOSIS — M25511 Pain in right shoulder: Secondary | ICD-10-CM

## 2016-11-12 DIAGNOSIS — M75121 Complete rotator cuff tear or rupture of right shoulder, not specified as traumatic: Secondary | ICD-10-CM

## 2016-11-12 DIAGNOSIS — M1711 Unilateral primary osteoarthritis, right knee: Secondary | ICD-10-CM

## 2016-11-12 HISTORY — DX: Unilateral primary osteoarthritis, right knee: M17.11

## 2016-11-12 HISTORY — DX: Complete rotator cuff tear or rupture of right shoulder, not specified as traumatic: M75.121

## 2016-12-07 ENCOUNTER — Emergency Department (HOSPITAL_COMMUNITY)
Admission: EM | Admit: 2016-12-07 | Discharge: 2016-12-07 | Disposition: A | Discharge status: Home / Self Care | Payer: BC Managed Care – PPO | Attending: Emergency Medicine | Admitting: Emergency Medicine

## 2016-12-07 ENCOUNTER — Emergency Department (HOSPITAL_COMMUNITY): Payer: BC Managed Care – PPO

## 2016-12-07 ENCOUNTER — Encounter (HOSPITAL_COMMUNITY): Payer: Self-pay | Admitting: Emergency Medicine

## 2016-12-07 DIAGNOSIS — Y999 Unspecified external cause status: Secondary | ICD-10-CM | POA: Insufficient documentation

## 2016-12-07 DIAGNOSIS — E119 Type 2 diabetes mellitus without complications: Secondary | ICD-10-CM | POA: Insufficient documentation

## 2016-12-07 DIAGNOSIS — Y9301 Activity, walking, marching and hiking: Secondary | ICD-10-CM | POA: Diagnosis not present

## 2016-12-07 DIAGNOSIS — M25511 Pain in right shoulder: Secondary | ICD-10-CM

## 2016-12-07 DIAGNOSIS — S4991XA Unspecified injury of right shoulder and upper arm, initial encounter: Secondary | ICD-10-CM | POA: Diagnosis present

## 2016-12-07 DIAGNOSIS — Z7984 Long term (current) use of oral hypoglycemic drugs: Secondary | ICD-10-CM | POA: Insufficient documentation

## 2016-12-07 DIAGNOSIS — M25561 Pain in right knee: Secondary | ICD-10-CM | POA: Diagnosis not present

## 2016-12-07 DIAGNOSIS — M25562 Pain in left knee: Secondary | ICD-10-CM

## 2016-12-07 DIAGNOSIS — I10 Essential (primary) hypertension: Secondary | ICD-10-CM | POA: Insufficient documentation

## 2016-12-07 DIAGNOSIS — Y929 Unspecified place or not applicable: Secondary | ICD-10-CM | POA: Insufficient documentation

## 2016-12-07 DIAGNOSIS — W109XXA Fall (on) (from) unspecified stairs and steps, initial encounter: Secondary | ICD-10-CM | POA: Insufficient documentation

## 2016-12-07 DIAGNOSIS — M25569 Pain in unspecified knee: Secondary | ICD-10-CM

## 2016-12-07 DIAGNOSIS — W19XXXA Unspecified fall, initial encounter: Secondary | ICD-10-CM

## 2016-12-07 DIAGNOSIS — Z79899 Other long term (current) drug therapy: Secondary | ICD-10-CM | POA: Diagnosis not present

## 2016-12-07 NOTE — ED Notes (Signed)
Pt ambulatory and independent at discharge.  Verbalized understanding of discharge instructions 

## 2016-12-07 NOTE — ED Triage Notes (Signed)
Patient c/o right shoulder, bilateral knee, and neck pain after fall yesterday. 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 Denies head injury and LOC. Ambulatory to triage.

## 2016-12-07 NOTE — ED Provider Notes (Signed)
[facility]-EMERGENCY DEPT Provider Note   CSN: [id number] Arrival date & time: 12/07/16  2028  By signing my name below, I, Phillips Climes, attest that this documentation has been prepared under the direction and in the presence of Etha Stambaugh, [place]. Electronically Signed: Phillips Climes, Scribe. 12/07/2016. 11:06 PM.  History   Chief Complaint Chief Complaint  Patient presents with  . Fall   HPI Comments Joan [name] is a 61 y.o. female with a PMHx significant for reported torn right rotator cuff, for which she declined surgery and opted for physical therapy, who presents to the Emergency Department with complaints of right shoulder and knee pain, s/p a mechanical fall last night, where pt missed a step and subsequently tripped. Pt broke her fall with her knees. No head injury, trauma or LOC.  No headache, back pain, neck stiffness or gait anomalies. She denies blood thinner use. She denies visual symptoms. No bleeding or wounds. Pain was relieved by Naproxen, however, she presents today for further evaluation to r/o fracture.  Sx currently rated a 5/10 in severity. Per pt, her RUE ROM is slightly more limited than her baseline. Pt denies experiencing any other acute sx, including fever, chills, nausea, vomiting, abdominal pain, numbness or weakness. Pt is able to bear weight and is ambulating at her baseline.  The history is provided by the patient. No language interpreter was used.   Past Medical History:  Diagnosis Date  . Acid reflux   . Diabetes mellitus without complication (HCC)   . Hypertension   . OSA (obstructive sleep apnea)    Patient Active Problem List   Diagnosis Date Noted  . OBSTRUCTIVE SLEEP APNEA 10/10/2009   Past Surgical History:  Procedure Laterality Date  . ABDOMINAL HYSTERECTOMY    . TONSILECTOMY, ADENOIDECTOMY, BILATERAL MYRINGOTOMY AND TUBES     OB History    No data available     Home Medications    Prior to Admission medications     Medication Sig Start Date End Date Taking? Authorizing Provider  esomeprazole (NEXIUM) 40 MG capsule Take 40 mg by mouth as needed.     [provider]  ezetimibe-simvastatin (VYTORIN) 10-40 MG per tablet Take 1 tablet by mouth at bedtime.      [provider]  FeFum-FePoly-FA-B Cmp-C-Biot (INTEGRA PLUS PO) Take by mouth as needed.     [provider]  hydrALAZINE (APRESOLINE) 25 MG tablet Take 25 mg by mouth 2 (two) times daily.     [provider]  lisinopril-hydrochlorothiazide (PRINZIDE,ZESTORETIC) 20-25 MG per tablet Take 1 tablet by mouth daily.    [provider]  Multiple Vitamins-Minerals (MULTIVITAMIN PO) Take 1 tablet by mouth daily.    [provider]  naproxen (NAPROSYN) 250 MG tablet Take 250 mg by mouth as needed.     [provider]  sitaGLIPtan-metformin (JANUMET) 50-500 MG per tablet Take 1 tablet by mouth daily.      [provider]  solifenacin (VESICARE) 10 MG tablet Take by mouth daily.    [provider]  vitamin B-12 (CYANOCOBALAMIN) 1000 MCG tablet Take 1,000 mcg by mouth daily.      [provider]    Family History Family History  Problem Relation Age of Onset  . Heart disease Mother   . Heart disease Father   . Prostate cancer Father     Social History Social History  Substance Use Topics  . Smoking status: Never Smoker  . Smokeless tobacco: Never Used  .  Alcohol use No     Allergies   Penicillins  Review of Systems Review of Systems  Constitutional: Negative for chills and fever.  Gastrointestinal: Negative for abdominal pain, nausea and vomiting.  Musculoskeletal: Positive for arthralgias and myalgias. Negative for back pain, gait problem, neck pain and neck stiffness.  Skin: Negative for wound.  Neurological: Negative for weakness, numbness and headaches.   Physical Exam Updated Vital Signs BP (!) 143/71 (BP Location: Right Arm)   Pulse 87   Temp 98.4  F (36.9 C) (Oral)   Resp 18   SpO2 97%   Physical Exam  Constitutional: She is oriented to person, place, and time. She appears well-developed and well-nourished. No distress.  Well appearing  HENT:  Head: Normocephalic and atraumatic.  Nose: Nose normal.  No redness, swelling, TTP, or deformity noted to head or scalp.   Eyes: Conjunctivae and EOM are normal. Pupils are equal, round, and reactive to light.  Neck: Normal range of motion.  Cardiovascular: Normal rate and intact distal pulses.   2+ distal pulses to all 4 extremities  Pulmonary/Chest: Effort normal. No respiratory distress.  Normal work of breathing. No respiratory distress noted.   Abdominal: Soft.  Musculoskeletal: Normal range of motion.  FROM with active and passive movement of right shoulder and right knee joints. Pt with tenderness to palpation of right shoulder. No warmth, deformity, wound, surrounding erythema noted. No TTP to knees bilaterally. No significant swelling to knees. No wound, redness, deformity noted to knees bilaterally.  No midline cervical, thoracic, or lumbar spine tenderness. TTP to neck bilaterally over trapezius muscles. No wound, deformity, redness.  Neurological: She is alert and oriented to person, place, and time. She has normal strength. No cranial nerve deficit or sensory deficit. She displays a negative Romberg sign. Coordination and gait normal. GCS eye subscore is 4. GCS verbal subscore is 5. GCS motor subscore is 6.  Sensation intact. Muscle strength 5/5 and good against resistance to shoulder flexion/extension, knee flexion/extension bilaterally. Pt able to stand and ambulate without difficulty.   Skin: Skin is warm. Capillary refill takes less than 2 seconds.  Psychiatric: She has a normal mood and affect. Her behavior is normal.  Nursing note and vitals reviewed.   ED Treatments / Results  DIAGNOSTIC STUDIES: Oxygen Saturation is 97% on room air, normal by my interpretation.     COORDINATION OF CARE: 9:04 PM Discussed treatment plan with pt at bedside and pt agreed to plan.  Labs (all labs ordered are listed, but only abnormal results are displayed) Labs Reviewed - No data to display  EKG  EKG Interpretation None       Radiology Dg Shoulder Right  Result Date: 12/07/2016 CLINICAL DATA:  Patient c/o right shoulder, bilateral knee, and neck pain after fall yesterday. 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 EXAM: RIGHT SHOULDER - 2+ VIEW COMPARISON:  None. FINDINGS: Osseous alignment is normal. No fracture line or displaced fracture fragment seen. Subtle lucencies within the right humeral head are most likely Jaimes degenerative subchondral cysts related to chronic overlying rotator cuff injury. Degenerative spurring noted at the right [facility] joint. Soft tissues about the right shoulder are unremarkable. IMPRESSION: No acute findings.  Mild degenerative change, as described above. Electronically Signed   By: Bary Richard M.D.   On: 12/07/2016 22:00   Dg Knee 1-2 Views Left  Result Date: 12/07/2016 CLINICAL DATA:  Fall, right knee pain. Sunrise view requested. Best obtainble due to pt condition. EXAM: LEFT KNEE -  1-2 VIEW COMPARISON:  None. FINDINGS: Mercer PodSunrise view of the patella shows no evidence of fracture or dislocation. Subtle lucency within the inferior patella on earlier plain film examination is suspected to be artifactual. IMPRESSION: Negative.  No evidence of patellar fracture or dislocation. Electronically Signed   By: Bary RichardStan  Maynard M.D.   On: 12/07/2016 22:38   Dg Knee Complete 4 Views Left  Result Date: 12/07/2016 CLINICAL DATA:  Patient c/o right shoulder, bilateral knee, and neck pain after fall yesterday. 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 EXAM: LEFT KNEE - COMPLETE 4+ VIEW COMPARISON:  None. FINDINGS: Mild degenerative narrowing of the medial compartment. Osseous alignment is otherwise normal. No fracture line or displaced fracture fragment seen. No  appreciable joint effusion and adjacent soft tissues are unremarkable. IMPRESSION: No acute findings. Mild degenerative narrowing of the medial compartment. Electronically Signed   By: Bary RichardStan  Maynard M.D.   On: 12/07/2016 22:04   Dg Knee Complete 4 Views Right  Result Date: 12/07/2016 CLINICAL DATA:  Patient c/o right shoulder, bilateral knee, and neck pain after fall yesterday. 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 EXAM: RIGHT KNEE - COMPLETE 4+ VIEW COMPARISON:  None. FINDINGS: Subtle lucency within the inferior patella, only appreciable on the lateral view, suspected artifactual, less likely slightly displaced fracture line. No evidence of fracture within the distal femur, proximal tibia or proximal fibula. Mild degenerative spurring noted about the medial and lateral compartments. Probable joint effusion within the suprapatellar bursa. Probable joint effusion within the suprapatellar bursa. IMPRESSION: 1. Questionable lucency within the inferior patella, only appreciable on the lateral projection, suspected to be artifactual but slightly displaced fracture line cannot be excluded. Consider repeat plain film of the patella including a sunrise view for further characterization. 2. Probable joint effusion within the suprapatellar bursa. 3. Mild degenerative change. Electronically Signed   By: Bary RichardStan  Maynard M.D.   On: 12/07/2016 22:03   Procedures Procedures (including critical care time)  Medications Ordered in ED Medications - No data to display   Initial Impression / Assessment and Plan / ED Course  I have reviewed the triage vital signs and the nursing notes.  Pertinent labs & imaging results that were available during my care of the patient were reviewed by me and considered in my medical decision making (see chart for details).  Patient X-Ray negative for obvious fracture or dislocation.  Pt advised to follow up with orthopedics. No brace needed at this time, as pt with full range of motion in  right shoulder and weight bearing on both knees. Conservative therapy recommended and discussed. Patient will be discharged home & is verbally agreeable with above plan. Returns precautions discussed. Pt appears safe for discharge.      Final Clinical Impressions(s) / ED Diagnoses   Final diagnoses:  Fall, initial encounter  Acute pain of right shoulder  Acute pain of both knees    New Prescriptions New Prescriptions   No medications on file   I personally performed the services described in this documentation, which was scribed in my presence. The recorded information has been reviewed and is accurate.   Candie Milespina, Wilton Thrall [place] [employer], [place] 12/07/16 2311    Nira Connardama, Pedro Eduardo, MD 12/08/16 [phone]

## 2016-12-07 NOTE — Discharge Instructions (Signed)
Please follow up with Dr. Lajoyce Cornersuda, Orthopedic surgeon, or your own orthopedic surgeon regarding today's visit in 1-2 weeks. Please use rest, ice, compression, elevation to areas affected. Please use ibuprofen or naproxen as needed for pain.   Get help right away if: Your arm, hand, or fingers: Tingle. Become numb. Become swollen. Become painful. Turn white or blue. Get help right away if: Your knee feels warm to the touch. You cannot move your knee. You have severe pain in your knee. You have chest pain. You have trouble breathing.

## 2017-03-07 DIAGNOSIS — E119 Type 2 diabetes mellitus without complications: Secondary | ICD-10-CM | POA: Insufficient documentation

## 2017-03-07 DIAGNOSIS — H2513 Age-related nuclear cataract, bilateral: Secondary | ICD-10-CM

## 2017-03-07 DIAGNOSIS — H5213 Myopia, bilateral: Secondary | ICD-10-CM

## 2017-03-07 HISTORY — DX: Age-related nuclear cataract, bilateral: H25.13

## 2017-03-07 HISTORY — DX: Myopia, bilateral: H52.13

## 2017-03-07 HISTORY — DX: Type 2 diabetes mellitus without complications: E11.9

## 2018-01-31 ENCOUNTER — Other Ambulatory Visit: Payer: Self-pay

## 2018-01-31 ENCOUNTER — Encounter (HOSPITAL_BASED_OUTPATIENT_CLINIC_OR_DEPARTMENT_OTHER): Payer: Self-pay

## 2018-01-31 ENCOUNTER — Emergency Department (HOSPITAL_BASED_OUTPATIENT_CLINIC_OR_DEPARTMENT_OTHER)
Admission: EM | Admit: 2018-01-31 | Discharge: 2018-01-31 | Disposition: A | Discharge status: Home / Self Care | Payer: BC Managed Care – PPO | Attending: Emergency Medicine | Admitting: Emergency Medicine

## 2018-01-31 DIAGNOSIS — Z79899 Other long term (current) drug therapy: Secondary | ICD-10-CM | POA: Diagnosis not present

## 2018-01-31 DIAGNOSIS — E119 Type 2 diabetes mellitus without complications: Secondary | ICD-10-CM | POA: Insufficient documentation

## 2018-01-31 DIAGNOSIS — R21 Rash and other nonspecific skin eruption: Secondary | ICD-10-CM | POA: Diagnosis not present

## 2018-01-31 DIAGNOSIS — I10 Essential (primary) hypertension: Secondary | ICD-10-CM | POA: Insufficient documentation

## 2018-01-31 MED ORDER — HYDROCORTISONE 1 % EX CREA: TOPICAL_CREAM | CUTANEOUS | 0 refills | Status: DC

## 2018-01-31 MED ORDER — FAMOTIDINE 20 MG PO TABS: 20.0000 mg | ORAL_TABLET | Freq: Two times a day (BID) | ORAL | 0 refills | Status: DC

## 2018-01-31 NOTE — ED Triage Notes (Signed)
Pt reports generalized hives and itching x1 month despite taking benadryl. Pt concerned that she is allergic to dogs. Pt A+OX4.

## 2018-01-31 NOTE — ED Provider Notes (Signed)
[facility] EMERGENCY DEPARTMENT Provider Note   CSN: [id number] Arrival date & time: 01/31/18  1423     History   Chief Complaint Chief Complaint  Patient presents with  . Urticaria    HPI Joan [name] is a 62 y.o. female with a hx of diabetes, HTN, and OSA who presents to the ED with complaints of rash for the past 1 month. Patient describes the rash as pruritic red spots mainly to back and her extremities.  She states that each of these areas seems to resolve, but new areas appear.  She has been using topical cream (unknown of what kind) and Benadryl with some relief but without resolution. 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 She states that she did move into a new apartment and get a new bedroom set just prior to onset of rash. No other new environments or recent new products. She has tried washing her sheets and clothing. She denies dyspnea, feeling of throat closing, facial swelling, or fever. Denies genital involvement. Denies recent tic exposures.   HPI  Past Medical History:  Diagnosis Date  . Acid reflux   . Diabetes mellitus without complication (HCC)   . Hypertension   . OSA (obstructive sleep apnea)     Patient Active Problem List   Diagnosis Date Noted  . OBSTRUCTIVE SLEEP APNEA 10/10/2009    Past Surgical History:  Procedure Laterality Date  . ABDOMINAL HYSTERECTOMY    . TONSILECTOMY, ADENOIDECTOMY, BILATERAL MYRINGOTOMY AND TUBES       OB History   None      Home Medications    Prior to Admission medications   Medication Sig Start Date End Date Taking? Authorizing Provider  solifenacin (VESICARE) 5 MG tablet Take 5 mg by mouth daily.   Yes [provider]  Cholecalciferol (D 400) 400 units CHEW Chew 400 Units by mouth daily.    [provider]  esomeprazole (NEXIUM) 40 MG capsule Take 40 mg by mouth as needed.     [provider]  ezetimibe-simvastatin (VYTORIN) 10-40 MG per tablet Take 1 tablet by mouth at bedtime.      [provider]  FeFum-FePoly-FA-B Cmp-C-Biot (INTEGRA PLUS PO) Take by mouth as needed.     [provider]  hydrALAZINE (APRESOLINE) 25 MG tablet Take 25 mg by mouth 2 (two) times daily.     [provider]  lisinopril-hydrochlorothiazide (PRINZIDE,ZESTORETIC) 20-25 MG per tablet Take 1 tablet by mouth daily.    [provider]  Multiple Vitamins-Minerals (MULTIVITAMIN PO) Take 1 tablet by mouth daily.    [provider]  naproxen (NAPROSYN) 250 MG tablet Take 250 mg by mouth as needed.     [provider]  sitaGLIPtan-metformin (JANUMET) 50-500 MG per tablet Take 1 tablet by mouth daily.      [provider]  solifenacin (VESICARE) 10 MG tablet Take by mouth daily.    [provider]  vitamin B-12 (CYANOCOBALAMIN) 1000 MCG tablet Take 1,000 mcg by mouth daily.      [provider]    Family History Family History  Problem Relation Age of Onset  . Heart disease Mother   . Heart disease Father   . Prostate cancer Father     Social History Social History   Tobacco Use  . Smoking status: Never Smoker  . Smokeless tobacco: Never  Used  Substance Use Topics  . Alcohol use: No  . Drug use: No     Allergies   Penicillins   Review of Systems Review of Systems  Constitutional: Negative for chills and fever.  HENT: Negative for facial swelling, sore throat, trouble swallowing and voice change.   Respiratory: Negative for shortness of breath.   Cardiovascular: Negative for chest pain.  Skin: Positive for rash.     Physical Exam Updated Vital Signs BP (!) 143/93 (BP Location: Right Arm)   Pulse 72   Temp 98.4 F (36.9 C) (Oral)   Resp 18   Ht 5\' 6"  (1.676 m)   Wt (!) 152.1 kg (335 lb 5.1 oz)   SpO2 98%   BMI 54.12 kg/m   Physical Exam  Constitutional: She appears well-developed and  well-nourished. No distress.  HENT:  Head: Normocephalic and atraumatic.  Mouth/Throat: Uvula is midline.  Airway is patent.  Patient is tolerating her secretions without difficulty.  No trismus.  No hot potato voice.  No drooling.  Facial swelling.  No appreciable angioedema.  Eyes: Pupils are equal, round, and reactive to light. Conjunctivae are normal. Right eye exhibits no discharge. Left eye exhibits no discharge.  Cardiovascular: Normal rate and regular rhythm.  No murmur heard. Pulmonary/Chest: Effort normal and breath sounds normal. No stridor. No respiratory distress. She has no decreased breath sounds. She has no wheezes.  Neurological: She is alert.  Clear speech.   Skin: Rash noted. No petechiae and no purpura noted. Rash is papular (Scattered to upper extremities, back, and a few on the lower extremities.  Genital exam deferred.). Rash is not nodular, not pustular, not vesicular and not urticarial.  No palpable induration or fluctuance.  No increased warmth.  No involvement of palms or soles.  No involvement of webspaces.  Psychiatric: She has a normal mood and affect. Her behavior is normal. Thought content normal.  Nursing note and vitals reviewed.    ED Treatments / Results  Labs (all labs ordered are listed, but only abnormal results are displayed) Labs Reviewed - No data to display  EKG None  Radiology No results found.  Procedures Procedures (including critical care time)  Medications Ordered in ED Medications - No data to display   Initial Impression / Assessment and Plan / ED Course  I have reviewed the triage vital signs and the nursing notes.  Pertinent labs & imaging results that were available during my care of the patient were reviewed by me and considered in my medical decision making (see chart for details).   Patient presents to the emergency department today with complaint of rash, appears to be multiple papules scattered to the extremities and  back, suspicious for insect bites of some kind.  Patient nontoxic-appearing, no apparent distress, vitals WNL with the exception of elevated blood pressure, doubt HTN emergency, patient aware of need for recheck and med compliance. Patient denies any difficulty breathing or swallowing.  She has a patent airway without stridor and is handling secretions without difficulty; no angioedema. No blisters, no pustules, no warmth, no draining sinus tracts, no superficial abscesses, no bullous impetigo, no vesicles, no desquamation, no target lesions with dusky purpura or a central bulla. Not tender to touch. No concern for superimposed infection. No concern for SJS, TEN, TSS, tick borne illness, syphilis or other life-threatening condition. Will discharge home with topical hydrocortisone, pepcid, and recommend Benadryl as needed for pruritis. Recommended contacting exterminator for further evaluation. I discussed treatment plan, need  for PCP follow-up, and return precautions with the patient. Provided opportunity for questions, patient confirmed understanding and is in agreement with plan.   Final Clinical Impressions(s) / ED Diagnoses   Final diagnoses:  Rash    ED Discharge Orders        Ordered    famotidine (PEPCID) 20 MG tablet  2 times daily     01/31/18 1535    hydrocortisone cream 1 %     01/31/18 [address][id number]       Eliberto Sole, ColumbusSamantha R, PA-C 01/31/18 1941    Tegeler, Canary Brimhristopher J, MD 02/01/18 [id number]

## 2018-01-31 NOTE — Discharge Instructions (Signed)
You were seen in the emergency department today for a rash.  The rash looks suspicious for possible bug bites, however it is hard to tell.  We are sending you home with symptomatic treatment.  Please take Pepcid twice per day to help with your symptoms.  Please apply the hydrocortisone cream to areas of rash twice per day.  Continue to take Benadryl per over-the-counter dosing instructions for itching. We have prescribed you new medication(s) today. Discuss the medications prescribed today with your pharmacist as they can have adverse effects and interactions with your other medicines including over the counter and prescribed medications. Seek medical evaluation if you start to experience new or abnormal symptoms after taking one of these medicines, seek care immediately if you start to experience difficulty breathing, feeling of your throat closing, facial swelling, or rash as these could be indications of a more serious allergic reaction  Please follow-up with your primary care provider within 3 to 5 days for reevaluation.  Please call and discuss with your apartment complex regarding possible exterminator evaluation.  Return to the ER anytime for new or worsening symptoms including but not limited to worsening rash, fevers, rash on your palms or soles your feet, trouble breathing, facial swelling, or any other concerns.

## 2018-04-13 ENCOUNTER — Other Ambulatory Visit: Payer: Self-pay | Admitting: Nurse Practitioner

## 2018-04-13 DIAGNOSIS — I10 Essential (primary) hypertension: Secondary | ICD-10-CM

## 2018-04-13 DIAGNOSIS — E782 Mixed hyperlipidemia: Secondary | ICD-10-CM

## 2018-04-13 DIAGNOSIS — R7303 Prediabetes: Secondary | ICD-10-CM

## 2018-04-14 LAB — CBC WITH DIFFERENTIAL/PLATELET
Basophils Absolute: 0 10*3/uL (ref 0.0–0.2)
Basos: 1 %
EOS (ABSOLUTE): 0.2 10*3/uL (ref 0.0–0.4)
Eos: 3 %
Hematocrit: 32.7 % — ABNORMAL LOW (ref 34.0–46.6)
Hemoglobin: 10.2 g/dL — ABNORMAL LOW (ref 11.1–15.9)
Immature Grans (Abs): 0 10*3/uL (ref 0.0–0.1)
Immature Granulocytes: 0 %
Lymphocytes Absolute: 1.7 10*3/uL (ref 0.7–3.1)
Lymphs: 29 %
MCH: 25.6 pg — ABNORMAL LOW (ref 26.6–33.0)
MCHC: 31.2 g/dL — ABNORMAL LOW (ref 31.5–35.7)
MCV: 82 fL (ref 79–97)
Monocytes Absolute: 0.4 10*3/uL (ref 0.1–0.9)
Monocytes: 8 %
Neutrophils Absolute: 3.5 10*3/uL (ref 1.4–7.0)
Neutrophils: 59 %
Platelets: 233 10*3/uL (ref 150–450)
RBC: 3.98 x10E6/uL (ref 3.77–5.28)
RDW: 13.3 % (ref 12.3–15.4)
WBC: 5.9 10*3/uL (ref 3.4–10.8)

## 2018-04-14 LAB — BASIC METABOLIC PANEL
BUN/Creatinine Ratio: 17 (ref 12–28)
BUN: 15 mg/dL (ref 8–27)
CO2: 24 mmol/L (ref 20–29)
Calcium: 9.5 mg/dL (ref 8.7–10.3)
Chloride: 104 mmol/L (ref 96–106)
Creatinine, Ser: 0.9 mg/dL (ref 0.57–1.00)
GFR calc Af Amer: 80 mL/min/{1.73_m2} (ref >59)
GFR calc non Af Amer: 70 mL/min/{1.73_m2} (ref >59)
Glucose: 97 mg/dL (ref 65–99)
Potassium: 3.7 mmol/L (ref 3.5–5.2)
Sodium: 143 mmol/L (ref 134–144)

## 2018-04-14 LAB — IRON AND TIBC
Iron Saturation: 10 % — ABNORMAL LOW (ref 15–55)
Iron: 27 ug/dL (ref 27–159)
Total Iron Binding Capacity: 281 ug/dL (ref 250–450)
UIBC: 254 ug/dL (ref 131–425)

## 2018-04-14 LAB — FERRITIN: Ferritin: 187 ng/mL — ABNORMAL HIGH (ref 15–150)

## 2018-04-14 LAB — HGB A1C W/O EAG: Hgb A1c MFr Bld: 5.7 % — ABNORMAL HIGH (ref 4.8–5.6)

## 2018-04-14 LAB — VITAMIN D 25 HYDROXY (VIT D DEFICIENCY, FRACTURES): Vit D, 25-Hydroxy: 28.1 ng/mL — ABNORMAL LOW (ref 30.0–100.0)

## 2018-04-20 ENCOUNTER — Encounter (HOSPITAL_COMMUNITY): Payer: Self-pay

## 2018-04-20 ENCOUNTER — Emergency Department (HOSPITAL_COMMUNITY)
Admission: EM | Admit: 2018-04-20 | Discharge: 2018-04-20 | Disposition: A | Discharge status: Home / Self Care | Payer: No Typology Code available for payment source | Attending: Emergency Medicine | Admitting: Emergency Medicine

## 2018-04-20 DIAGNOSIS — Y939 Activity, unspecified: Secondary | ICD-10-CM | POA: Insufficient documentation

## 2018-04-20 DIAGNOSIS — R51 Headache: Secondary | ICD-10-CM | POA: Diagnosis not present

## 2018-04-20 DIAGNOSIS — Y990 Civilian activity done for income or pay: Secondary | ICD-10-CM | POA: Diagnosis not present

## 2018-04-20 DIAGNOSIS — Z79899 Other long term (current) drug therapy: Secondary | ICD-10-CM | POA: Insufficient documentation

## 2018-04-20 DIAGNOSIS — E119 Type 2 diabetes mellitus without complications: Secondary | ICD-10-CM | POA: Insufficient documentation

## 2018-04-20 DIAGNOSIS — Y92410 Unspecified street and highway as the place of occurrence of the external cause: Secondary | ICD-10-CM | POA: Insufficient documentation

## 2018-04-20 DIAGNOSIS — S199XXA Unspecified injury of neck, initial encounter: Secondary | ICD-10-CM | POA: Diagnosis present

## 2018-04-20 DIAGNOSIS — S161XXA Strain of muscle, fascia and tendon at neck level, initial encounter: Secondary | ICD-10-CM | POA: Diagnosis not present

## 2018-04-20 DIAGNOSIS — I10 Essential (primary) hypertension: Secondary | ICD-10-CM | POA: Insufficient documentation

## 2018-04-20 MED ORDER — METHOCARBAMOL 500 MG PO TABS: 500.0000 mg | ORAL_TABLET | Freq: Four times a day (QID) | ORAL | 0 refills | Status: DC | PRN

## 2018-04-20 MED ORDER — NAPROXEN 375 MG PO TABS
375.0000 mg | ORAL_TABLET | Freq: Once | ORAL | Status: AC
Start: 2018-04-20 — End: 2018-04-20
  Administered 2018-04-20: 375 mg via ORAL
  Filled 2018-04-20: qty 1

## 2018-04-20 MED ORDER — NAPROXEN 250 MG PO TABS: 250.0000 mg | ORAL_TABLET | Freq: Two times a day (BID) | ORAL | 0 refills | Status: DC
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
## 2018-04-23 ENCOUNTER — Encounter: Payer: Self-pay | Admitting: Internal Medicine

## 2018-04-23 ENCOUNTER — Ambulatory Visit (INDEPENDENT_AMBULATORY_CARE_PROVIDER_SITE_OTHER): Payer: Self-pay | Admitting: Internal Medicine

## 2018-04-23 VITALS — BP 140/80 | HR 61 | Temp 98.1°F | Ht 66.0 in | Wt 331.2 lb

## 2018-04-23 DIAGNOSIS — M542 Cervicalgia: Secondary | ICD-10-CM

## 2018-04-23 NOTE — Progress Notes (Signed)
  Subjective:     Patient ID: Joan [name] , female    DOB: 12/15/1955 , 62 y.o.   MRN: [id number]   HPI  Pt was roomed in error, she is supposed to be seen through a provider via [facility] or [place] for [employer]. I called [facility] urgent [facility] and they work with [facility].        Past Medical History:          Today's Vitals   04/23/18 1006  BP: 140/80  Pulse: 61  Temp: 98.1 F (36.7 C)  TempSrc: Oral  SpO2: 97%  Weight: (!) 331 lb 3.2 oz (150.2 kg)  Height: 5\' 6"  (1.676 m)   Body mass index is 53.46 kg/m.   Objective:  Physical Exam      Assessment And Plan:       Kolyn Rozario RODRIGUEZ-SOUTHWORTH, PA-C

## 2018-04-30 ENCOUNTER — Encounter: Payer: Self-pay | Admitting: Internal Medicine

## 2018-06-08 ENCOUNTER — Ambulatory Visit: Payer: Self-pay | Admitting: Nurse Practitioner

## 2018-06-08 ENCOUNTER — Ambulatory Visit: Payer: BC Managed Care – PPO | Admitting: Nurse Practitioner

## 2018-06-08 ENCOUNTER — Encounter: Payer: Self-pay | Admitting: Nurse Practitioner

## 2018-06-08 VITALS — BP 160/98 | HR 84 | Temp 98.1°F | Ht 66.5 in | Wt 328.6 lb

## 2018-06-08 DIAGNOSIS — I10 Essential (primary) hypertension: Secondary | ICD-10-CM

## 2018-06-08 DIAGNOSIS — R3981 Functional urinary incontinence: Secondary | ICD-10-CM | POA: Diagnosis not present

## 2018-06-08 DIAGNOSIS — N6323 Unspecified lump in the left breast, lower outer quadrant: Secondary | ICD-10-CM | POA: Diagnosis not present

## 2018-06-08 MED ORDER — SOLIFENACIN SUCCINATE 10 MG PO TABS: 10.0000 mg | ORAL_TABLET | Freq: Every day | ORAL | 1 refills | Status: DC

## 2018-06-08 NOTE — Progress Notes (Signed)
Subjective:     Patient ID: Joan [name] , female    DOB: 01/10/1956 , 62 y.o.   MRN: [id number]   Chief Complaint  Patient presents with  . Hypertension    HPI  Hypertension  This is a chronic problem. The current episode started more than 1 year ago. The problem has been gradually worsening since onset. The problem is uncontrolled. Pertinent negatives include no anxiety, chest pain, headaches, palpitations or shortness of breath. Risk factors for coronary artery disease include sedentary lifestyle. Compliance problems: will take medication her way.  There is no history of angina or kidney disease.     Past Medical History:  Diagnosis Date  . Acid reflux   . Diabetes mellitus without complication (HCC)    Pt states she is prediabetic  . Hypertension   . OSA (obstructive sleep apnea)      Family History  Problem Relation Age of Onset  . Heart disease Mother   . Heart disease Father   . Prostate cancer Father      Current Outpatient Medications:  .  esomeprazole (NEXIUM) 40 MG capsule, Take 40 mg by mouth as needed. , Disp: , Rfl:  .  ezetimibe-simvastatin (VYTORIN) 10-40 MG per tablet, Take 1 tablet by mouth at bedtime.  , Disp: , Rfl:  .  hydrALAZINE (APRESOLINE) 25 MG tablet, Take 25 mg by mouth 2 (two) times daily. , Disp: , Rfl:  .  lisinopril-hydrochlorothiazide (PRINZIDE,ZESTORETIC) 20-25 MG per tablet, Take 1 tablet by mouth daily., Disp: , Rfl:  .  methocarbamol (ROBAXIN) 500 MG tablet, Take 1 tablet (500 mg total) by mouth every 6 (six) hours as needed for muscle spasms., Disp: 15 tablet, Rfl: 0 .  Multiple Vitamins-Minerals (MULTIVITAMIN PO), Take 1 tablet by mouth daily., Disp: , Rfl:  .  naproxen (NAPROSYN) 250 MG tablet, Take 1 tablet (250 mg total) by mouth 2 (two) times daily with a meal., Disp: 10 tablet, Rfl: 0 .  solifenacin (VESICARE) 10 MG tablet, Take by mouth daily., Disp: , Rfl:  .  famotidine (PEPCID) 20 MG tablet, Take 1 tablet (20 mg total) by  mouth 2 (two) times daily. (Patient not taking: Reported on 06/08/2018), Disp: 20 tablet, Rfl: 0 .  solifenacin (VESICARE) 5 MG tablet, Take 5 mg by mouth daily., Disp: , Rfl:    Allergies  Allergen Reactions  . Penicillins     REACTION: hives     Review of Systems  Constitutional: Negative.  Negative for fatigue.  Eyes: Negative for visual disturbance.  Respiratory: Negative.  Negative for shortness of breath.   Cardiovascular: Negative.  Negative for chest pain, palpitations and leg swelling.  Gastrointestinal: Negative.   Endocrine: Negative.   Musculoskeletal: Negative.   Skin: Negative.   Neurological: Negative for dizziness, weakness and headaches.  Psychiatric/Behavioral: Negative for confusion. The patient is not nervous/anxious.      Today's Vitals   06/08/18 1549  BP: (!) 160/98  Pulse: 84  Temp: 98.1 F (36.7 C)  TempSrc: Oral  SpO2: 96%  Weight: (!) 328 lb 9.6 oz (149.1 kg)  Height: 5' 6.5" (1.689 m)  PainSc: 0-No pain   Body mass index is 52.24 kg/m.   Objective:  Physical Exam Vitals signs reviewed.  Constitutional:      Appearance: She is well-developed and well-nourished.  Eyes:     Pupils: Pupils are equal, round, and reactive to light.  Cardiovascular:     Rate and Rhythm: Normal rate and regular rhythm.  Pulses: Intact distal pulses.     Heart sounds: Normal heart sounds. No murmur.  Pulmonary:     Effort: Pulmonary effort is normal.     Breath sounds: Normal breath sounds.  Chest:     Breasts:        Right: No mass.        Left: Mass present.    Musculoskeletal: Normal range of motion.        General: No edema.  Skin:    General: Skin is warm and dry.     Capillary Refill: Capillary refill takes less than 2 seconds.  Neurological:     Mental Status: She is alert and oriented to person, place, and time.     Cranial Nerves: No cranial nerve deficit.  Psychiatric:        Mood and Affect: Mood and affect normal.           Assessment And Plan:  1. Breast lump on left side at 4 o'clock position  Firm mass to left breast at 4 o'clock  Will check diagnostic mammogram - MM DIAG BREAST TOMO BILATERAL; Future  2. Functional urinary incontinence  Chronic, controlled  Continue with current medications - solifenacin (VESICARE) 10 MG tablet; Take 1 tablet (10 mg total) by mouth daily.  Dispense: 90 tablet; Refill: 1  3. Essential hypertension . B/P is controlled.  . The importance of regular exercise and dietary modification was stressed to the patient.  . Stressed importance of losing ten percent of her body weight to help with B/P control. The weight loss would help with decreasing cardiac and cancer risk as well.    [name], FNP

## 2018-06-16 ENCOUNTER — Other Ambulatory Visit: Payer: Self-pay | Admitting: Nurse Practitioner

## 2018-06-16 DIAGNOSIS — N6323 Unspecified lump in the left breast, lower outer quadrant: Secondary | ICD-10-CM

## 2018-06-19 ENCOUNTER — Ambulatory Visit: Admission: RE | Admit: 2018-06-19 | Payer: Self-pay | Source: Ambulatory Visit

## 2018-06-19 ENCOUNTER — Ambulatory Visit
Admission: RE | Admit: 2018-06-19 | Discharge: 2018-06-19 | Disposition: A | Discharge status: Home / Self Care | Payer: BC Managed Care – PPO | Source: Ambulatory Visit | Attending: Nurse Practitioner | Admitting: Nurse Practitioner

## 2018-06-19 DIAGNOSIS — N6323 Unspecified lump in the left breast, lower outer quadrant: Secondary | ICD-10-CM

## 2018-06-22 ENCOUNTER — Encounter: Payer: Self-pay | Admitting: Nurse Practitioner

## 2018-06-28 DIAGNOSIS — R3981 Functional urinary incontinence: Secondary | ICD-10-CM | POA: Insufficient documentation

## 2018-06-28 DIAGNOSIS — I10 Essential (primary) hypertension: Secondary | ICD-10-CM | POA: Insufficient documentation

## 2018-06-28 DIAGNOSIS — N6323 Unspecified lump in the left breast, lower outer quadrant: Secondary | ICD-10-CM | POA: Insufficient documentation

## 2018-06-28 HISTORY — DX: Functional urinary incontinence: R39.81

## 2018-06-28 HISTORY — DX: Unspecified lump in the left breast, lower outer quadrant: N63.23

## 2018-06-28 HISTORY — DX: Essential (primary) hypertension: I10

## 2018-07-10 ENCOUNTER — Encounter: Payer: Self-pay | Admitting: Nurse Practitioner

## 2018-07-24 DIAGNOSIS — H3521 Other non-diabetic proliferative retinopathy, right eye: Secondary | ICD-10-CM | POA: Insufficient documentation

## 2018-07-24 HISTORY — DX: Other non-diabetic proliferative retinopathy, right eye: H35.21

## 2018-08-10 ENCOUNTER — Ambulatory Visit: Payer: BC Managed Care – PPO | Admitting: Nurse Practitioner

## 2018-11-02 ENCOUNTER — Other Ambulatory Visit: Payer: Self-pay

## 2018-11-02 ENCOUNTER — Encounter: Payer: Self-pay | Admitting: Nurse Practitioner

## 2018-11-02 ENCOUNTER — Ambulatory Visit (INDEPENDENT_AMBULATORY_CARE_PROVIDER_SITE_OTHER): Payer: BC Managed Care – PPO | Admitting: Nurse Practitioner

## 2018-11-02 VITALS — BP 160/82 | HR 72 | Temp 98.2°F | Ht 64.6 in | Wt 325.2 lb

## 2018-11-02 DIAGNOSIS — M25561 Pain in right knee: Secondary | ICD-10-CM

## 2018-11-02 DIAGNOSIS — G8929 Other chronic pain: Secondary | ICD-10-CM | POA: Diagnosis not present

## 2018-11-02 DIAGNOSIS — E782 Mixed hyperlipidemia: Secondary | ICD-10-CM

## 2018-11-02 DIAGNOSIS — Z6841 Body Mass Index (BMI) 40.0 and over, adult: Secondary | ICD-10-CM

## 2018-11-02 DIAGNOSIS — I10 Essential (primary) hypertension: Secondary | ICD-10-CM

## 2018-11-02 DIAGNOSIS — M25562 Pain in left knee: Secondary | ICD-10-CM | POA: Diagnosis not present

## 2018-11-02 DIAGNOSIS — R7309 Other abnormal glucose: Secondary | ICD-10-CM

## 2018-11-02 DIAGNOSIS — D509 Iron deficiency anemia, unspecified: Secondary | ICD-10-CM

## 2018-11-02 HISTORY — DX: Other chronic pain: G89.29

## 2018-11-02 MED ORDER — DICLOFENAC SODIUM 1 % TD GEL: 2.0000 g | Freq: Four times a day (QID) | TRANSDERMAL | 2 refills | Status: DC

## 2018-11-02 MED ORDER — INTEGRA F 125-1 MG PO CAPS: 1.0000 | ORAL_CAPSULE | Freq: Every day | ORAL | 2 refills | Status: DC

## 2018-11-02 MED ORDER — HYDROCHLOROTHIAZIDE 25 MG PO TABS: 25.0000 mg | ORAL_TABLET | Freq: Every day | ORAL | 2 refills | Status: DC

## 2018-11-02 MED ORDER — LIRAGLUTIDE -WEIGHT MANAGEMENT 18 MG/3ML ~~LOC~~ SOPN: 3.0000 mg | PEN_INJECTOR | Freq: Every day | SUBCUTANEOUS | 1 refills | Status: DC

## 2018-11-02 NOTE — Progress Notes (Signed)
Subjective:     Patient ID: Joan [name] , female    DOB: 02/25/1956 , 63 y.o.   MRN: [id number]   Chief Complaint  Patient presents with  . Form Completion    HPI  She has limited water intake.  Hypertension  This is a new (She ate fish yesterday with old bay seasoning and used french fries seasoning) problem. The current episode started more than 1 year ago. The problem has been gradually worsening since onset. The problem is uncontrolled. Pertinent negatives include no anxiety, blurred vision, chest pain, headaches or palpitations. There are no associated agents to hypertension. Risk factors for coronary artery disease include sedentary lifestyle and obesity. Past treatments include ACE inhibitors and diuretics. The current treatment provides mild improvement. There are no compliance problems.  There is no history of angina. There is no history of chronic renal disease.     Past Medical History:  Diagnosis Date  . Acid reflux   . Diabetes mellitus without complication (HCC)    Pt states she is prediabetic  . Hypertension   . OSA (obstructive sleep apnea)      Family History  Problem Relation Age of Onset  . Heart disease Mother   . Heart disease Father   . Prostate cancer Father      Current Outpatient Medications:  .  cholecalciferol (VITAMIN D3) 25 MCG (1000 UT) tablet, Take 1,000 Units by mouth daily., Disp: , Rfl:  .  esomeprazole (NEXIUM) 40 MG capsule, Take 40 mg by mouth as needed. , Disp: , Rfl:  .  ezetimibe-simvastatin (VYTORIN) 10-40 MG per tablet, Take 1 tablet by mouth at bedtime.  , Disp: , Rfl:  .  hydrALAZINE (APRESOLINE) 25 MG tablet, Take 25 mg by mouth 2 (two) times daily. , Disp: , Rfl:  .  lisinopril-hydrochlorothiazide (PRINZIDE,ZESTORETIC) 20-25 MG per tablet, Take 1 tablet by mouth daily., Disp: , Rfl:  .  methocarbamol (ROBAXIN) 500 MG tablet, Take 1 tablet (500 mg total) by mouth every 6 (six) hours as needed for muscle spasms., Disp: 15  tablet, Rfl: 0 .  Multiple Vitamins-Minerals (MULTIVITAMIN PO), Take 1 tablet by mouth daily., Disp: , Rfl:  .  naproxen (NAPROSYN) 250 MG tablet, Take 1 tablet (250 mg total) by mouth 2 (two) times daily with a meal., Disp: 10 tablet, Rfl: 0 .  solifenacin (VESICARE) 10 MG tablet, Take 1 tablet (10 mg total) by mouth daily., Disp: 90 tablet, Rfl: 1 .  famotidine (PEPCID) 20 MG tablet, Take 1 tablet (20 mg total) by mouth 2 (two) times daily. (Patient not taking: Reported on 06/08/2018), Disp: 20 tablet, Rfl: 0   Allergies  Allergen Reactions  . Penicillins     REACTION: hives     Review of Systems  HENT: Negative.   Eyes: Negative for blurred vision.  Respiratory: Negative.   Cardiovascular: Negative.  Negative for chest pain, palpitations and leg swelling.  Gastrointestinal: Negative.   Neurological: Negative.  Negative for dizziness and headaches.     Today's Vitals   11/02/18 1043  BP: (!) 160/82  Pulse: 72  Temp: 98.2 F (36.8 C)  TempSrc: Oral  SpO2: 97%  Weight: (!) 325 lb 3.2 oz (147.5 kg)  Height: 5' 4.6" (1.641 m)   Body mass index is 54.79 kg/m.   Objective:  Physical Exam Vitals signs reviewed.  Constitutional:      Appearance: Normal appearance.  Cardiovascular:     Rate and Rhythm: Normal rate and regular rhythm.  Pulses: Normal pulses.     Heart sounds: Normal heart sounds. No murmur.  Neurological:     Mental Status: She is alert.         Assessment And Plan:     1. 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  I have instructed her to limit her intake of high salt foods - hydrochlorothiazide (HYDRODIURIL) 25 MG tablet; Take 1 tablet (25 mg total) by mouth daily.  Dispense: 30 tablet; Refill: 2 - CMP14 + Anion Gap  2. Chronic pain of both knees  She has been followed by orthopedics   Will treat with  diclofenac gel - diclofenac sodium (VOLTAREN) 1 % GEL; Apply 2 g topically 4 (four) times daily.  Dispense: 100 g; Refill: 2  3. Iron deficiency anemia, unspecified iron deficiency anemia type  Has been without her Integra  Will check iron studies - CBC with Diff - Iron, TIBC and Ferritin Panel - Fe Fum-FePoly-FA-Vit C-Vit B3 (INTEGRA F) 125-1 MG CAPS; Take 1 tablet by mouth daily.  Dispense: 30 capsule; Refill: 2  4. Abnormal glucose  Chronic, controlled  Encouraged to limit intake of sugary foods and drinks  Encouraged to increase physical activity to 150 minutes per week as tolerated - CMP14 + Anion Gap - Hemoglobin A1c  5. Mixed hyperlipidemia  Chronic, controlled  Continue with current medications  Encouraged to avoid fried and fatty foods - Lipid Profile  6. Class 3 severe obesity without serious comorbidity with body mass index (BMI) of 50.0 to 59.9 in adult, unspecified obesity type (HCC)  She is wanting to restart saxenda again  I have discussed with her the off and on increases her risk for worsening weight gain and the possibility the medication may not be effective  I have stressed she needs to exercise more as well - Liraglutide -Weight Management (SAXENDA) 18 MG/3ML SOPN; Inject 3 mg into the skin daily.  Dispense: 5 pen; Refill: 1   [name], FNP    THE PATIENT IS ENCOURAGED TO PRACTICE SOCIAL DISTANCING DUE TO THE COVID-19 PANDEMIC.

## 2018-11-03 ENCOUNTER — Telehealth: Payer: Self-pay

## 2018-11-03 ENCOUNTER — Encounter: Payer: Self-pay | Admitting: Nurse Practitioner

## 2018-11-03 LAB — CMP14 + ANION GAP
ALT: 16 IU/L (ref 0–32)
AST: 17 IU/L (ref 0–40)
Albumin/Globulin Ratio: 1.6 (ref 1.2–2.2)
Albumin: 4.5 g/dL (ref 3.8–4.8)
Alkaline Phosphatase: 97 IU/L (ref 39–117)
Anion Gap: 18 mmol/L (ref 10.0–18.0)
BUN/Creatinine Ratio: 28 (ref 12–28)
BUN: 23 mg/dL (ref 8–27)
Bilirubin Total: 0.3 mg/dL (ref 0.0–1.2)
CO2: 23 mmol/L (ref 20–29)
Calcium: 10.3 mg/dL (ref 8.7–10.3)
Chloride: 101 mmol/L (ref 96–106)
Creatinine, Ser: 0.82 mg/dL (ref 0.57–1.00)
GFR calc Af Amer: 89 mL/min/{1.73_m2} (ref >59)
GFR calc non Af Amer: 77 mL/min/{1.73_m2} (ref >59)
Globulin, Total: 2.8 g/dL (ref 1.5–4.5)
Glucose: 90 mg/dL (ref 65–99)
Potassium: 4 mmol/L (ref 3.5–5.2)
Sodium: 142 mmol/L (ref 134–144)
Total Protein: 7.3 g/dL (ref 6.0–8.5)

## 2018-11-03 LAB — IRON,TIBC AND FERRITIN PANEL
Ferritin: 182 ng/mL — ABNORMAL HIGH (ref 15–150)
Iron Saturation: 12 % — ABNORMAL LOW (ref 15–55)
Iron: 35 ug/dL (ref 27–139)
Total Iron Binding Capacity: 303 ug/dL (ref 250–450)
UIBC: 268 ug/dL (ref 118–369)

## 2018-11-03 LAB — LIPID PANEL
Chol/HDL Ratio: 4.2 ratio (ref 0.0–4.4)
Cholesterol, Total: 260 mg/dL — ABNORMAL HIGH (ref 100–199)
HDL: 62 mg/dL (ref >39)
LDL Calculated: 184 mg/dL — ABNORMAL HIGH (ref 0–99)
Triglycerides: 68 mg/dL (ref 0–149)
VLDL Cholesterol Cal: 14 mg/dL (ref 5–40)

## 2018-11-03 LAB — CBC WITH DIFFERENTIAL/PLATELET
Basophils Absolute: 0 10*3/uL (ref 0.0–0.2)
Basos: 0 %
EOS (ABSOLUTE): 0.2 10*3/uL (ref 0.0–0.4)
Eos: 3 %
Hematocrit: 31.4 % — ABNORMAL LOW (ref 34.0–46.6)
Hemoglobin: 10.1 g/dL — ABNORMAL LOW (ref 11.1–15.9)
Immature Grans (Abs): 0 10*3/uL (ref 0.0–0.1)
Immature Granulocytes: 0 %
Lymphocytes Absolute: 1.8 10*3/uL (ref 0.7–3.1)
Lymphs: 30 %
MCH: 26.2 pg — ABNORMAL LOW (ref 26.6–33.0)
MCHC: 32.2 g/dL (ref 31.5–35.7)
MCV: 81 fL (ref 79–97)
Monocytes Absolute: 0.4 10*3/uL (ref 0.1–0.9)
Monocytes: 7 %
Neutrophils Absolute: 3.6 10*3/uL (ref 1.4–7.0)
Neutrophils: 60 %
Platelets: 238 10*3/uL (ref 150–450)
RBC: 3.86 x10E6/uL (ref 3.77–5.28)
RDW: 13.6 % (ref 11.7–15.4)
WBC: 6 10*3/uL (ref 3.4–10.8)

## 2018-11-03 LAB — HEMOGLOBIN A1C
Est. average glucose Bld gHb Est-mCnc: 117 mg/dL
Hgb A1c MFr Bld: 5.7 % — ABNORMAL HIGH (ref 4.8–5.6)

## 2018-11-03 NOTE — Telephone Encounter (Signed)
Patient called with the name of the medications she has is naproxen 500mg  one tab bid #60, cyclobenzaprine hcl 5mg  qhs prn #30 and methocarbamol 750mg  1 tab bid prn #60 for muscle spasms. I have updated the chart. YRL,RMA

## 2018-11-03 NOTE — Telephone Encounter (Signed)
Okay thank you

## 2018-11-04 ENCOUNTER — Telehealth: Payer: Self-pay | Admitting: Nurse Practitioner

## 2018-11-04 NOTE — Telephone Encounter (Signed)
[place] APPR[place] FOR DICLOFENAC 1% GEL 11/04/2018 - 11/03/2021

## 2018-11-05 ENCOUNTER — Telehealth: Payer: Self-pay

## 2018-11-05 ENCOUNTER — Other Ambulatory Visit: Payer: Self-pay | Admitting: Nurse Practitioner

## 2018-11-05 NOTE — Telephone Encounter (Signed)
-----   Message from [name], FNP sent at 11/04/2018  5:37 PM EDT ----- Your hgb is still low, are you taking the iron supplement every day?  Kidney and liver functions are normal. Total cholesterol is 260 goal is less than 199 and LDL is 184 goal is less than 99 limit  Your intake of fried and fatty foods.

## 2018-11-05 NOTE — Telephone Encounter (Signed)
1st attempt to give lab results . Voice mail not set up

## 2018-11-06 ENCOUNTER — Telehealth: Payer: Self-pay

## 2018-11-06 NOTE — Telephone Encounter (Signed)
Voicemail not set up mailing result note

## 2018-11-18 ENCOUNTER — Telehealth: Payer: Self-pay

## 2018-11-18 NOTE — Telephone Encounter (Signed)
Attempted to call pt to notify that her Voltaren gel has been approved. Unable to leave v/m due to mailbox being full. YRL,RMA

## 2018-11-23 ENCOUNTER — Encounter: Payer: Self-pay | Admitting: Nurse Practitioner

## 2019-01-08 ENCOUNTER — Other Ambulatory Visit: Payer: Self-pay | Admitting: Internal Medicine

## 2019-01-14 LAB — NOVEL CORONAVIRUS, NAA: SARS-CoV-2, NAA: NOT DETECTED

## 2019-01-14 NOTE — Progress Notes (Signed)
Attempted to call patient - individual who answered was Spanish speaking only - no answer at work number.

## 2019-01-19 ENCOUNTER — Telehealth: Payer: Self-pay | Admitting: Nurse Practitioner

## 2019-01-19 NOTE — Telephone Encounter (Signed)
See result note.  

## 2019-01-19 NOTE — Telephone Encounter (Signed)
Pt called for covid-19 results. Let patient know that it was negative

## 2019-01-25 ENCOUNTER — Encounter: Payer: Self-pay | Admitting: Nurse Practitioner

## 2019-03-02 ENCOUNTER — Encounter: Payer: Self-pay | Admitting: Nurse Practitioner

## 2019-04-12 ENCOUNTER — Other Ambulatory Visit: Payer: Self-pay

## 2019-04-12 DIAGNOSIS — Z20822 Contact with and (suspected) exposure to covid-19: Secondary | ICD-10-CM

## 2019-04-13 LAB — NOVEL CORONAVIRUS, NAA: SARS-CoV-2, NAA: NOT DETECTED

## 2019-05-19 ENCOUNTER — Telehealth: Payer: Self-pay

## 2019-05-19 NOTE — Telephone Encounter (Signed)
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 She is needing more forms to be completed with a recent blood pressure check within the last 90 days.   I HAVE RETURNED PT CALL AND SCHEDULED HER AN APPOINTMENT. Joan [name]

## 2019-05-26 ENCOUNTER — Other Ambulatory Visit: Payer: Self-pay

## 2019-05-26 ENCOUNTER — Ambulatory Visit: Payer: BC Managed Care – PPO | Admitting: Nurse Practitioner

## 2019-05-26 ENCOUNTER — Encounter: Payer: Self-pay | Admitting: Nurse Practitioner

## 2019-05-26 ENCOUNTER — Ambulatory Visit (INDEPENDENT_AMBULATORY_CARE_PROVIDER_SITE_OTHER): Payer: BC Managed Care – PPO | Admitting: Nurse Practitioner

## 2019-05-26 VITALS — BP 150/82 | HR 98 | Temp 98.8°F | Ht 64.8 in | Wt 335.8 lb

## 2019-05-26 DIAGNOSIS — Z9114 Patient's other noncompliance with medication regimen: Secondary | ICD-10-CM

## 2019-05-26 DIAGNOSIS — R7309 Other abnormal glucose: Secondary | ICD-10-CM | POA: Diagnosis not present

## 2019-05-26 DIAGNOSIS — E782 Mixed hyperlipidemia: Secondary | ICD-10-CM

## 2019-05-26 DIAGNOSIS — W102XXA Fall (on)(from) incline, initial encounter: Secondary | ICD-10-CM

## 2019-05-26 DIAGNOSIS — I10 Essential (primary) hypertension: Secondary | ICD-10-CM | POA: Diagnosis not present

## 2019-05-26 DIAGNOSIS — E559 Vitamin D deficiency, unspecified: Secondary | ICD-10-CM

## 2019-05-26 DIAGNOSIS — Z1159 Encounter for screening for other viral diseases: Secondary | ICD-10-CM

## 2019-05-26 HISTORY — DX: Mixed hyperlipidemia: E78.2

## 2019-05-26 HISTORY — DX: Other abnormal glucose: R73.09

## 2019-05-26 MED ORDER — LISINOPRIL-HYDROCHLOROTHIAZIDE 20-25 MG PO TABS: 1.0000 | ORAL_TABLET | Freq: Every day | ORAL | 1 refills | Status: DC

## 2019-05-26 NOTE — Progress Notes (Signed)
Subjective:     Patient ID: Joan [name] , female    DOB: June 30, 1956 , 63 y.o.   MRN: [id number]   Chief Complaint  Patient presents with  . Hypertension    HPI  Wt Readings from Last 3 Encounters: 05/26/19 : (!) 335 lb 12.8 oz (152.3 kg) 11/02/18 : (!) 325 lb 3.2 oz (147.5 kg) 06/08/18 : (!) 328 lb 9.6 oz (149.1 kg)  She reports she had an antibody test for coronavirus.    She is drinking hawaiin punch during the day and one bottle per day.    Hypertension This is a chronic problem. The current episode started more than 1 year ago. The problem has been gradually worsening since onset. The problem is uncontrolled (she had not been taking her medications daily. she had a fish sandwich with salt on it.  ). Pertinent negatives include no anxiety, chest pain, headaches or palpitations. There are no associated agents to hypertension. Risk factors for coronary artery disease include obesity and sedentary lifestyle.     Past Medical History:  Diagnosis Date  . Acid reflux   . Diabetes mellitus without complication ([place])    Pt states she is prediabetic  . Hypertension   . OSA (obstructive sleep apnea)      Family History  Problem Relation Age of Onset  . Heart disease Mother   . Heart disease Father   . Prostate cancer Father      Current Outpatient Medications:  .  cholecalciferol (VITAMIN D3) 25 MCG (1000 UT) tablet, Take 1,000 Units by mouth daily., Disp: , Rfl:  .  CYCLOBENZAPRINE HCL PO, Take 1 tablet by mouth at bedtime as needed., Disp: , Rfl:  .  diclofenac sodium (VOLTAREN) 1 % GEL, Apply 2 g topically 4 (four) times daily., Disp: 100 g, Rfl: 2 .  ezetimibe-simvastatin (VYTORIN) 10-40 MG per tablet, Take 1 tablet by mouth at bedtime.  , Disp: , Rfl:  .  Fe Fum-FePoly-FA-Vit C-Vit B3 (INTEGRA F) 125-1 MG CAPS, Take 1 tablet by mouth daily., Disp: 30 capsule, Rfl: 2 .  hydrochlorothiazide (HYDRODIURIL) 25 MG tablet, Take 1 tablet (25 mg total) by mouth daily.,  Disp: 30 tablet, Rfl: 2 .  Liraglutide -Weight Management (SAXENDA) 18 MG/3ML SOPN, Inject 3 mg into the skin daily., Disp: 5 pen, Rfl: 1 .  lisinopril-hydrochlorothiazide (PRINZIDE,ZESTORETIC) 20-25 MG per tablet, Take 1 tablet by mouth daily., Disp: , Rfl:  .  methocarbamol (ROBAXIN) 750 MG tablet, Take 750 mg by mouth 2 (two) times daily as needed for muscle spasms., Disp: , Rfl:  .  Multiple Vitamins-Minerals (MULTIVITAMIN PO), Take 1 tablet by mouth daily., Disp: , Rfl:  .  naproxen (NAPROSYN) 500 MG tablet, Take 500 mg by mouth 2 (two) times daily as needed., Disp: , Rfl:  .  omeprazole (PRILOSEC) 40 MG capsule, Take 40 mg by mouth daily., Disp: , Rfl:  .  solifenacin (VESICARE) 10 MG tablet, Take 1 tablet (10 mg total) by mouth daily., Disp: 90 tablet, Rfl: 1   Allergies  Allergen Reactions  . Penicillins     REACTION: hives     Review of Systems  Constitutional: Negative.   Respiratory: Negative.   Cardiovascular: Negative for chest pain, palpitations and leg swelling.  Neurological: Negative for dizziness and headaches.     Today's Vitals   05/26/19 1552  BP: (!) 150/82  Pulse: 98  Temp: 98.8 F (37.1 C)  TempSrc: Oral  Weight: (!) 335 lb 12.8 oz (152.3  kg)  Height: 5' 4.8" (1.646 m)  PainSc: 0-No pain   Body mass index is 56.23 kg/m.   Objective:  Physical Exam Constitutional:      Appearance: Normal appearance.  Cardiovascular:     Rate and Rhythm: Normal rate and regular rhythm.     Pulses: Normal pulses.     Heart sounds: Normal heart sounds. No murmur.  Pulmonary:     Effort: Pulmonary effort is normal. No respiratory distress.     Breath sounds: Normal breath sounds.  Skin:    General: Skin is warm and dry.     Capillary Refill: Capillary refill takes less than 2 seconds.  Neurological:     General: No focal deficit present.     Mental Status: She is alert and oriented to person, place, and time.         Assessment And Plan:      1. Essential  hypertension  She needs her DMV forms completed  Her blood pressure is slightly elevated, this is likely related to not being consistent with taking her medications daily  She is encouraged to increase her water intake to at least a gallon of water per day - lisinopril-hydrochlorothiazide (ZESTORETIC) 20-25 MG tablet; Take 1 tablet by mouth daily.  Dispense: 90 tablet; Refill: 1 - CMP14+EGFR  2. Mixed hyperlipidemia  Chronic, controlled  No current medications  Advised to avoid fried and fatty foods.   3. Abnormal glucose  Chronic, will check HgbA1c  Encouraged to avoid sugary foods and drinks and to increase fiber intake - Hemoglobin A1c  4. Vitamin D deficiency  Will check vitamin D level and supplement as needed.     Also encouraged to spend 15 minutes in the sun daily.  - Vitamin D (25 hydroxy)  5. Encounter for hepatitis C screening test for low risk patient  Will check for Hepatitis C screening due to being born between the years 49-1964 - Hepatitis C antibody  6. Fall (on)(from) incline, initial encounter  Reports falling while going into the house without injury  Her family member assisted her up   Joan [name], [place] ENCOURAGED TO PRACTICE SOCIAL DISTANCING DUE TO THE COVID-19 [place].

## 2019-05-27 LAB — HEMOGLOBIN A1C
Est. average glucose Bld gHb Est-mCnc: 114 mg/dL
Hgb A1c MFr Bld: 5.6 % (ref 4.8–5.6)

## 2019-05-27 LAB — CMP14+EGFR
ALT: 16 IU/L (ref 0–32)
AST: 17 IU/L (ref 0–40)
Albumin/Globulin Ratio: 1.7 (ref 1.2–2.2)
Albumin: 4.3 g/dL (ref 3.8–4.8)
Alkaline Phosphatase: 97 IU/L (ref 39–117)
BUN/Creatinine Ratio: 23 (ref 12–28)
BUN: 25 mg/dL (ref 8–27)
Bilirubin Total: <0.2 mg/dL (ref 0.0–1.2)
CO2: 24 mmol/L (ref 20–29)
Calcium: 9.5 mg/dL (ref 8.7–10.3)
Chloride: 105 mmol/L (ref 96–106)
Creatinine, Ser: 1.1 mg/dL — ABNORMAL HIGH (ref 0.57–1.00)
GFR calc Af Amer: 63 mL/min/{1.73_m2} (ref >59)
GFR calc non Af Amer: 54 mL/min/{1.73_m2} — ABNORMAL LOW (ref >59)
Globulin, Total: 2.6 g/dL (ref 1.5–4.5)
Glucose: 102 mg/dL — ABNORMAL HIGH (ref 65–99)
Potassium: 3.8 mmol/L (ref 3.5–5.2)
Sodium: 143 mmol/L (ref 134–144)
Total Protein: 6.9 g/dL (ref 6.0–8.5)

## 2019-05-27 LAB — VITAMIN D 25 HYDROXY (VIT D DEFICIENCY, FRACTURES): Vit D, 25-Hydroxy: 22.8 ng/mL — ABNORMAL LOW (ref 30.0–100.0)

## 2019-05-27 LAB — HEPATITIS C ANTIBODY: Hep C Virus Ab: <0.1 s/co ratio (ref 0.0–0.9)

## 2019-05-31 ENCOUNTER — Telehealth: Payer: Self-pay

## 2019-05-31 NOTE — Telephone Encounter (Signed)
Pt called to cancel upcoming appt for blood pressure check, she also stated she would call back to reschedule the appt at a later date. Pt stated she would call Wednesday to pay for her DMV paper work

## 2019-06-01 ENCOUNTER — Encounter: Payer: BC Managed Care – PPO | Admitting: Nurse Practitioner

## 2019-06-01 ENCOUNTER — Ambulatory Visit: Payer: BC Managed Care – PPO

## 2019-06-08 ENCOUNTER — Telehealth: Payer: Self-pay

## 2019-06-08 NOTE — Telephone Encounter (Signed)
LVM for pt to call Medication Therapy management program for her mom Joan [name] per Dr. Baird Cancer  [phone] reference case ID [id number] also LM stating she need to call before 07/15/2019

## 2019-06-14 ENCOUNTER — Encounter: Payer: Self-pay | Admitting: Nurse Practitioner

## 2019-06-22 ENCOUNTER — Encounter: Payer: Self-pay | Admitting: Nurse Practitioner

## 2019-06-22 ENCOUNTER — Ambulatory Visit (INDEPENDENT_AMBULATORY_CARE_PROVIDER_SITE_OTHER): Payer: BC Managed Care – PPO | Admitting: Nurse Practitioner

## 2019-06-22 ENCOUNTER — Other Ambulatory Visit: Payer: Self-pay

## 2019-06-22 VITALS — BP 158/92 | HR 64 | Temp 97.7°F | Ht 64.4 in | Wt 339.8 lb

## 2019-06-22 DIAGNOSIS — Z01419 Encounter for gynecological examination (general) (routine) without abnormal findings: Secondary | ICD-10-CM

## 2019-06-22 DIAGNOSIS — Z113 Encounter for screening for infections with a predominantly sexual mode of transmission: Secondary | ICD-10-CM

## 2019-06-22 DIAGNOSIS — I10 Essential (primary) hypertension: Secondary | ICD-10-CM | POA: Diagnosis not present

## 2019-06-22 DIAGNOSIS — Z0000 Encounter for general adult medical examination without abnormal findings: Secondary | ICD-10-CM

## 2019-06-22 DIAGNOSIS — Z23 Encounter for immunization: Secondary | ICD-10-CM | POA: Diagnosis not present

## 2019-06-22 DIAGNOSIS — R3981 Functional urinary incontinence: Secondary | ICD-10-CM

## 2019-06-22 LAB — POCT UA - MICROALBUMIN
Creatinine, POC: 300 mg/dL
Microalbumin Ur, POC: 80 mg/L

## 2019-06-22 LAB — POCT URINALYSIS DIPSTICK
Bilirubin, UA: NEGATIVE
Blood, UA: NEGATIVE
Glucose, UA: NEGATIVE
Ketones, UA: NEGATIVE
Leukocytes, UA: NEGATIVE
Nitrite, UA: NEGATIVE
Protein, UA: NEGATIVE
Spec Grav, UA: >=1.03 — AB (ref 1.010–1.025)
Urobilinogen, UA: 0.2 E.U./dL
pH, UA: 5 (ref 5.0–8.0)

## 2019-06-22 MED ORDER — TETANUS-DIPHTH-ACELL PERTUSSIS 5-2.5-18.5 LF-MCG/0.5 IM SUSP: 0.5000 mL | Freq: Once | INTRAMUSCULAR | Status: DC

## 2019-06-22 NOTE — Patient Instructions (Signed)
Health Maintenance  Topic Date Due  . COLONOSCOPY  06/16/2007  . PAP SMEAR-Modifier  05/14/2015  . INFLUENZA VACCINE  04/14/2020 (Originally 02/13/2019)  . HIV Screening  06/21/2020 (Originally 06/15/1972)  . MAMMOGRAM  06/19/2020  . TETANUS/TDAP  06/21/2029  . Hepatitis C Screening  Completed   Health Maintenance, Female Adopting a healthy lifestyle and getting preventive care are important in promoting health and wellness. Ask your health care provider about:  The right schedule for you to have regular tests and exams.  Things you can do on your own to prevent diseases and keep yourself healthy. What should I know about diet, weight, and exercise? Eat a healthy diet   Eat a diet that includes plenty of vegetables, fruits, low-fat dairy products, and lean protein.  Do not eat a lot of foods that are high in solid fats, added sugars, or sodium. Maintain a healthy weight Body mass index (BMI) is used to identify weight problems. It estimates body fat based on height and weight. Your health care provider can help determine your BMI and help you achieve or maintain a healthy weight. Get regular exercise Get regular exercise. This is one of the most important things you can do for your health. Most adults should:  Exercise for at least 150 minutes each week. The exercise should increase your heart rate and make you sweat (moderate-intensity exercise).  Do strengthening exercises at least twice a week. This is in addition to the moderate-intensity exercise.  Spend less time sitting. Even light physical activity can be beneficial. Watch cholesterol and blood lipids Have your blood tested for lipids and cholesterol at 62 years of age, then have this test every 5 years. Have your cholesterol levels checked more often if:  Your lipid or cholesterol levels are high.  You are older than 62 years of age.  You are at high risk for heart disease. What should I know about cancer screening?  Depending on your health history and family history, you may need to have cancer screening at various ages. This may include screening for:  Breast cancer.  Cervical cancer.  Colorectal cancer.  Skin cancer.  Lung cancer. What should I know about heart disease, diabetes, and high blood pressure? Blood pressure and heart disease  High blood pressure causes heart disease and increases the risk of stroke. This is more likely to develop in people who have high blood pressure readings, are of African descent, or are overweight.  Have your blood pressure checked: ? Every 3-5 years if you are 40-5 years of age. ? Every year if you are 39 years old or older. Diabetes Have regular diabetes screenings. This checks your fasting blood sugar level. Have the screening done:  Once every three years after age 41 if you are at a normal weight and have a low risk for diabetes.  More often and at a younger age if you are overweight or have a high risk for diabetes. What should I know about preventing infection? Hepatitis B If you have a higher risk for hepatitis B, you should be screened for this virus. Talk with your health care provider to find out if you are at risk for hepatitis B infection. Hepatitis C Testing is recommended for:  Everyone born from [birth year] through 1964.  Anyone with known risk factors for hepatitis C. Sexually transmitted infections (STIs)  Get screened for STIs, including gonorrhea and chlamydia, if: ? You are sexually active and are younger than 62 years of age. ?  You are older than 62 years of age and your health care provider tells you that you are at risk for this type of infection. ? Your sexual activity has changed since you were last screened, and you are at increased risk for chlamydia or gonorrhea. Ask your health care provider if you are at risk.  Ask your health care provider about whether you are at high risk for HIV. Your health care provider may recommend a  prescription medicine to help prevent HIV infection. If you choose to take medicine to prevent HIV, you should first get tested for HIV. You should then be tested every 3 months for as long as you are taking the medicine. Pregnancy  If you are about to stop having your period (premenopausal) and you may become pregnant, seek counseling before you get pregnant.  Take 400 to 800 micrograms (mcg) of folic acid every day if you become pregnant.  Ask for birth control (contraception) if you want to prevent pregnancy. Osteoporosis and menopause Osteoporosis is a disease in which the bones lose minerals and strength with aging. This can result in bone fractures. If you are 33 years old or older, or if you are at risk for osteoporosis and fractures, ask your health care provider if you should:  Be screened for bone loss.  Take a calcium or vitamin D supplement to lower your risk of fractures.  Be given hormone replacement therapy (HRT) to treat symptoms of menopause. Follow these instructions at home: Lifestyle  Do not use any products that contain nicotine or tobacco, such as cigarettes, e-cigarettes, and chewing tobacco. If you need help quitting, ask your health care provider.  Do not use street drugs.  Do not share needles.  Ask your health care provider for help if you need support or information about quitting drugs. Alcohol use  Do not drink alcohol if: ? Your health care provider tells you not to drink. ? You are pregnant, may be pregnant, or are planning to become pregnant.  If you drink alcohol: ? Limit how much you use to 0-1 drink a day. ? Limit intake if you are breastfeeding.  Be aware of how much alcohol is in your drink. In the [place] one drink equals one 12 oz bottle of beer (355 mL), one 5 oz glass of wine (148 mL), or one 1 oz glass of hard liquor (44 mL). General instructions  Schedule regular health, dental, and eye exams.  Stay current with your vaccines.   Tell your health care provider if: ? You often feel depressed. ? You have ever been abused or do not feel safe at home. Summary  Adopting a healthy lifestyle and getting preventive care are important in promoting health and wellness.  Follow your health care provider's instructions about healthy diet, exercising, and getting tested or screened for diseases.  Follow your health care provider's instructions on monitoring your cholesterol and blood pressure. This information is not intended to replace advice given to you by your health care provider. Make sure you discuss any questions you have with your health care provider. Document Released: 01/14/2011 Document Revised: 06/24/2018 Document Reviewed: 06/24/2018 Elsevier Patient Education  2020 [employer].

## 2019-06-22 NOTE — Progress Notes (Signed)
This visit occurred during the SARS-CoV-2 public health emergency.  Safety protocols were in place, including screening questions prior to the visit, additional usage of staff PPE, and extensive cleaning of exam room while observing appropriate contact time as indicated for disinfecting solutions.  Subjective:     Patient ID: Joan [name] , female    DOB: 10/07/1955 , 63 y.o.   MRN: [id number]   Chief Complaint  Patient presents with  . Annual Exam    HPI  Here for HM.     The patient states she uses status post hysterectomy for birth control.  Mammogram last done  Negative for: breast discharge, breast lump(s), breast pain and breast self exam.  Pertinent negatives include abnormal bleeding (hematology), anxiety, decreased libido, depression, difficulty falling sleep, dyspareunia, history of infertility, nocturia, sexual dysfunction, sleep disturbances, urinary incontinence, urinary urgency, vaginal discharge and vaginal itching. Diet regular. The patient states her exercise level is minimal.      The patient's tobacco use is:  Social History   Tobacco Use  Smoking Status Never Smoker  Smokeless Tobacco Never Used   She has been exposed to passive smoke. The patient's alcohol use is:  Social History   Substance and Sexual Activity  Alcohol Use No   Additional information: Last pap 2013 Past Medical History:  Diagnosis Date  . Acid reflux   . Diabetes mellitus without complication (HCC)    Pt states she is prediabetic  . Hypertension   . OSA (obstructive sleep apnea)      Family History  Problem Relation Age of Onset  . Heart disease Mother   . Heart disease Father   . Prostate cancer Father      Current Outpatient Medications:  .  cholecalciferol (VITAMIN D3) 25 MCG (1000 UT) tablet, Take 1,000 Units by mouth daily., Disp: , Rfl:  .  CYCLOBENZAPRINE HCL PO, Take 1 tablet by mouth at bedtime as needed., Disp: , Rfl:  .  diclofenac sodium (VOLTAREN) 1 % GEL,  Apply 2 g topically 4 (four) times daily., Disp: 100 g, Rfl: 2 .  ezetimibe-simvastatin (VYTORIN) 10-40 MG per tablet, Take 1 tablet by mouth at bedtime.  , Disp: , Rfl:  .  Fe Fum-FePoly-FA-Vit C-Vit B3 (INTEGRA F) 125-1 MG CAPS, Take 1 tablet by mouth daily., Disp: 30 capsule, Rfl: 2 .  hydrochlorothiazide (HYDRODIURIL) 25 MG tablet, Take 1 tablet (25 mg total) by mouth daily., Disp: 30 tablet, Rfl: 2 .  lisinopril-hydrochlorothiazide (ZESTORETIC) 20-25 MG tablet, Take 1 tablet by mouth daily., Disp: 90 tablet, Rfl: 1 .  methocarbamol (ROBAXIN) 750 MG tablet, Take 750 mg by mouth 2 (two) times daily as needed for muscle spasms., Disp: , Rfl:  .  Multiple Vitamins-Minerals (MULTIVITAMIN PO), Take 1 tablet by mouth daily., Disp: , Rfl:  .  naproxen (NAPROSYN) 500 MG tablet, Take 500 mg by mouth 2 (two) times daily as needed., Disp: , Rfl:  .  omeprazole (PRILOSEC) 40 MG capsule, Take 40 mg by mouth daily., Disp: , Rfl:  .  solifenacin (VESICARE) 10 MG tablet, Take 1 tablet (10 mg total) by mouth daily., Disp: 90 tablet, Rfl: 1   Allergies  Allergen Reactions  . Penicillins     REACTION: hives     Review of Systems  Constitutional: Negative.  Negative for fatigue.  HENT: Negative.   Eyes: Negative.   Respiratory: Negative.   Cardiovascular: Negative.  Negative for chest pain, palpitations and leg swelling.  Gastrointestinal: Negative.   Endocrine:  Negative.   Genitourinary: Negative.   Musculoskeletal: Negative.   Skin: Negative.   Allergic/Immunologic: Negative.   Neurological: Negative.  Negative for dizziness and headaches.  Hematological: Negative.   Psychiatric/Behavioral: Negative.      Today's Vitals   06/22/19 1559  BP: (!) 158/92  Pulse: 64  Temp: 97.7 F (36.5 C)  TempSrc: Oral  Weight: (!) 339 lb 12.8 oz (154.1 kg)  Height: 5' 4.4" (1.636 m)   Body mass index is 57.6 kg/m.   Objective:  Physical Exam Constitutional:      General: She is not in acute  distress.    Appearance: Normal appearance. She is well-developed. She is obese.     Comments: Morbid obesity  HENT:     Head: Normocephalic and atraumatic.     Right Ear: Hearing, tympanic membrane, ear canal and external ear normal. There is no impacted cerumen.     Left Ear: Hearing, tympanic membrane, ear canal and external ear normal. There is no impacted cerumen.     Nose: Nose normal.  Eyes:     General: Lids are normal.     Extraocular Movements: Extraocular movements intact.     Pupils: Pupils are equal, round, and reactive to light.     Funduscopic exam:    Right eye: No papilledema.        Left eye: No papilledema.  Neck:     Musculoskeletal: Full passive range of motion without pain, normal range of motion and neck supple.     Thyroid: No thyroid mass.     Vascular: No carotid bruit.  Cardiovascular:     Rate and Rhythm: Normal rate and regular rhythm.     Pulses: Normal pulses.     Heart sounds: Normal heart sounds. No murmur.  Pulmonary:     Effort: Pulmonary effort is normal. No respiratory distress.     Breath sounds: Normal breath sounds.  Abdominal:     General: Abdomen is flat. Bowel sounds are normal.     Palpations: Abdomen is soft.  Musculoskeletal: Normal range of motion.        General: No swelling or tenderness.     Right lower leg: No edema.     Left lower leg: No edema.  Skin:    General: Skin is warm and dry.     Capillary Refill: Capillary refill takes less than 2 seconds.  Neurological:     General: No focal deficit present.     Mental Status: She is alert and oriented to person, place, and time.     Cranial Nerves: No cranial nerve deficit.     Sensory: No sensory deficit.  Psychiatric:        Mood and Affect: Mood normal.        Behavior: Behavior normal.        Thought Content: Thought content normal.        Judgment: Judgment normal.         Assessment And Plan:     1. Health maintenance examination . Behavior modifications  discussed and diet history reviewed.   . Pt will continue to exercise regularly and modify diet with low GI, plant based foods and decrease intake of processed foods.  . Recommend intake of daily multivitamin, Vitamin D, and calcium.  . Recommend mammogram and colonoscopy for preventive screenings, as well as recommend immunizations that include influenza, TDAP  2. Encounter for gynecological examination  She would like a referral to Dr. Normand Sloop for her routine  Gyn evaluation - Ambulatory referral to Gynecology  3. Screening examination for STD (sexually transmitted disease)  Will check HIV  4. Essential hypertension  Chronic, slightly elevated  Encouraged to take her blood pressure medication once she gets home  EKG done with NSR  - EKG 12-Lead  5. Encounter for immunization  Will give tetanus vaccine today while in office. Refer to order management. TDAP will be administered to adults 74-92 years old every 10 years. - Tdap (BOOSTRIX) injection 0.5 mL  6. Functional urinary incontinence  She is having frequent episodes of urinary incontinence and would like to see a urologist for further evaluation - Ambulatory referral to Urology  Minette Brine, FNP    THE PATIENT IS ENCOURAGED TO PRACTICE SOCIAL DISTANCING DUE TO THE COVID-19 PANDEMIC.

## 2019-07-22 ENCOUNTER — Telehealth: Payer: Self-pay

## 2019-07-22 NOTE — Telephone Encounter (Signed)
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 Adolph Pollack

## 2019-07-26 ENCOUNTER — Encounter: Payer: Self-pay | Admitting: Nurse Practitioner

## 2019-07-26 ENCOUNTER — Ambulatory Visit: Payer: BC Managed Care – PPO | Admitting: Nurse Practitioner

## 2019-07-26 ENCOUNTER — Other Ambulatory Visit: Payer: Self-pay

## 2019-07-26 VITALS — BP 132/80 | HR 68 | Temp 98.4°F | Ht 64.0 in | Wt 337.4 lb

## 2019-07-26 DIAGNOSIS — I10 Essential (primary) hypertension: Secondary | ICD-10-CM | POA: Diagnosis not present

## 2019-07-26 DIAGNOSIS — Z6841 Body Mass Index (BMI) 40.0 and over, adult: Secondary | ICD-10-CM

## 2019-07-26 DIAGNOSIS — W009XXA Unspecified fall due to ice and snow, initial encounter: Secondary | ICD-10-CM

## 2019-07-26 HISTORY — DX: Unspecified fall due to ice and snow, initial encounter: W00.9XXA

## 2019-07-26 NOTE — Progress Notes (Signed)
This visit occurred during the SARS-CoV-2 public health emergency.  Safety protocols were in place, including screening questions prior to the visit, additional usage of staff PPE, and extensive cleaning of exam room while observing appropriate contact time as indicated for disinfecting solutions.  Subjective:     Patient ID: Joan [name] , female    DOB: 30-Jan-1956 , 64 y.o.   MRN: [id number]   Chief Complaint  Patient presents with  . Hypertension    HPI  She is here today for a blood pressure check.  She denies eating high salt foods.    She needs a letter sent to [facility] indicating blood pressure for visit today.     She had a blood pressure check at [facility] last month.    She has a extension until February 5th  Wt Readings from Last 3 Encounters: 07/26/19 : (!) 337 lb 6.4 oz (153 kg) 06/22/19 : (!) 339 lb 12.8 oz (154.1 kg) 05/26/19 : (!) 335 lb 12.8 oz (152.3 kg)   Hypertension This is a chronic problem. The current episode started more than 1 year ago. The problem has been gradually improving since onset. The problem is controlled. Pertinent negatives include no anxiety, chest pain, headaches, palpitations, peripheral edema or shortness of breath. Risk factors for coronary artery disease include sedentary lifestyle and obesity. Past treatments include ACE inhibitors and diuretics. Compliance problems: she is now taking her blood pressure medication regularly.  There is no history of angina. There is no history of chronic renal disease.  Fall The accident occurred 5 to 7 days ago (while she was walking down the grass with the ice on Friday she slid and did a "split" no obvious injuries. ). The fall occurred while walking (while she was walking down the grass with the ice on Friday she slid and did a "split" no obvious injuries.). She landed on concrete (on ice). Pertinent negatives include no headaches. She has tried nothing for the symptoms.     Past Medical History:   Diagnosis Date  . Acid reflux   . Diabetes mellitus without complication (HCC)    Pt states she is prediabetic  . Hypertension   . OSA (obstructive sleep apnea)      Family History  Problem Relation Age of Onset  . Heart disease Mother   . Heart disease Father   . Prostate cancer Father      Current Outpatient Medications:  .  cholecalciferol (VITAMIN D3) 25 MCG (1000 UT) tablet, Take 1,000 Units by mouth daily., Disp: , Rfl:  .  CYCLOBENZAPRINE HCL PO, Take 1 tablet by mouth at bedtime as needed., Disp: , Rfl:  .  diclofenac sodium (VOLTAREN) 1 % GEL, Apply 2 g topically 4 (four) times daily., Disp: 100 g, Rfl: 2 .  ezetimibe-simvastatin (VYTORIN) 10-40 MG per tablet, Take 1 tablet by mouth at bedtime.  , Disp: , Rfl:  .  Fe Fum-FePoly-FA-Vit C-Vit B3 (INTEGRA F) 125-1 MG CAPS, Take 1 tablet by mouth daily., Disp: 30 capsule, Rfl: 2 .  hydrochlorothiazide (HYDRODIURIL) 25 MG tablet, Take 1 tablet (25 mg total) by mouth daily., Disp: 30 tablet, Rfl: 2 .  lisinopril-hydrochlorothiazide (ZESTORETIC) 20-25 MG tablet, Take 1 tablet by mouth daily., Disp: 90 tablet, Rfl: 1 .  methocarbamol (ROBAXIN) 750 MG tablet, Take 750 mg by mouth 2 (two) times daily as needed for muscle spasms., Disp: , Rfl:  .  Multiple Vitamins-Minerals (MULTIVITAMIN PO), Take 1 tablet by mouth daily., Disp: , Rfl:  .  naproxen (NAPROSYN) 500 MG tablet, Take 500 mg by mouth 2 (two) times daily as needed., Disp: , Rfl:  .  omeprazole (PRILOSEC) 40 MG capsule, Take 40 mg by mouth daily., Disp: , Rfl:  .  solifenacin (VESICARE) 10 MG tablet, Take 1 tablet (10 mg total) by mouth daily., Disp: 90 tablet, Rfl: 1   Allergies  Allergen Reactions  . Penicillins     REACTION: hives     Review of Systems  Respiratory: Negative for shortness of breath.   Cardiovascular: Negative for chest pain and palpitations.  Neurological: Negative for headaches.     Today's Vitals   07/26/19 1043  BP: 132/80  Pulse: 68   Temp: 98.4 F (36.9 C)  TempSrc: Oral  Weight: (!) 337 lb 6.4 oz (153 kg)  Height: 5\' 4"  (1.626 m)  PainSc: 0-No pain   Body mass index is 57.91 kg/m.   Objective:  Physical Exam Constitutional:      Appearance: Normal appearance.  Cardiovascular:     Rate and Rhythm: Normal rate and regular rhythm.     Pulses: Normal pulses.     Heart sounds: Normal heart sounds. No murmur.  Pulmonary:     Effort: Pulmonary effort is normal. No respiratory distress.     Breath sounds: Normal breath sounds. No wheezing.  Neurological:     General: No focal deficit present.     Mental Status: She is alert and oriented to person, place, and time.  Psychiatric:        Mood and Affect: Mood normal.        Behavior: Behavior normal.        Thought Content: Thought content normal.        Judgment: Judgment normal.         Assessment And Plan:     1. Essential hypertension  Blood pressure is better controlled today  We will provide a letter for her in reference to this blood pressure check to the DM[employer] at her request.  2. Fall due to slipping on ice or snow, initial encounter  No significant injury  3. Class 3 severe obesity without serious comorbidity with body mass index (BMI) of 50.0 to 59.9 in adult, unspecified obesity type [facility])  She has started herself back on the Saxenda she had left. I have stressed the importance of not starting and stopping this medication and to focus on the foundation of healthy diet and regular exercise.  She may benefit from being evaluated at an obesity clinic.    Also she is interested in exercising at the [facility] which I feel would be beneficial to her health.     Minette Brine, FNP    THE PATIENT IS ENCOURAGED TO PRACTICE SOCIAL DISTANCING DUE TO THE COVID-19 PANDEMIC.

## 2019-07-27 ENCOUNTER — Telehealth: Payer: Self-pay

## 2019-07-27 NOTE — Telephone Encounter (Signed)
Patient's letter was faxed over to the [facility] at [phone] on 07/26/19. YRL,RMA

## 2019-07-28 ENCOUNTER — Telehealth: Payer: Self-pay

## 2019-07-28 NOTE — Telephone Encounter (Signed)
Prior auth for Joan [name] has been submitted through covermymeds and we are just waiting on the determination. YRL,RMA

## 2019-07-29 ENCOUNTER — Telehealth: Payer: Self-pay

## 2019-07-29 NOTE — Telephone Encounter (Signed)
I left patient a v/m notifying her that her saxenda has been approved for Saxenda. YRL,RMA

## 2019-08-03 ENCOUNTER — Other Ambulatory Visit: Payer: Self-pay

## 2019-08-03 MED ORDER — PEN NEEDLES 32G X 4 MM MISC: 1.0000 | Freq: Every day | 3 refills | Status: DC

## 2019-08-03 NOTE — Progress Notes (Signed)
pen

## 2019-08-05 ENCOUNTER — Other Ambulatory Visit: Payer: Self-pay | Admitting: Nurse Practitioner

## 2019-08-05 DIAGNOSIS — I10 Essential (primary) hypertension: Secondary | ICD-10-CM

## 2019-08-05 DIAGNOSIS — R3981 Functional urinary incontinence: Secondary | ICD-10-CM

## 2019-08-11 IMAGING — MG DIGITAL DIAGNOSTIC BILATERAL MAMMOGRAM WITH TOMO AND CAD
6 of 12 series · 6 of 36 positions shown · non-contrast
Comparison: October 16, 2007

ACR Breast Density Category a: The breast tissue is almost entirely
fatty.

Addendum:
CLINICAL DATA: 62-year-old patient with long-standing palpable mass
in the left breast, previously evaluated in October 2007, with benign
features consistent with fibroadenolipoma.Patient says that the mass
is tender, and she is interested in surgical consultation for
removal of it.

EXAM:
DIGITAL DIAGNOSTIC BILATERAL MAMMOGRAM WITH CAD AND TOMO

[L MLO synth-2D]
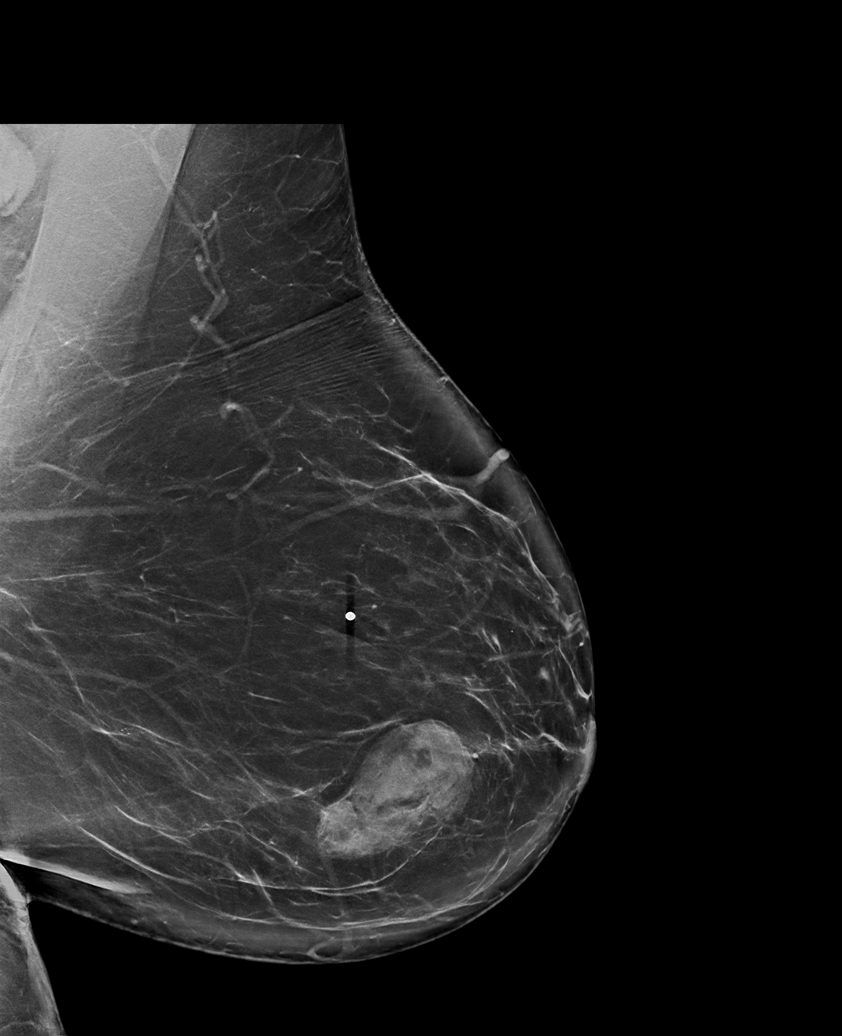

[R MLO synth-2D]
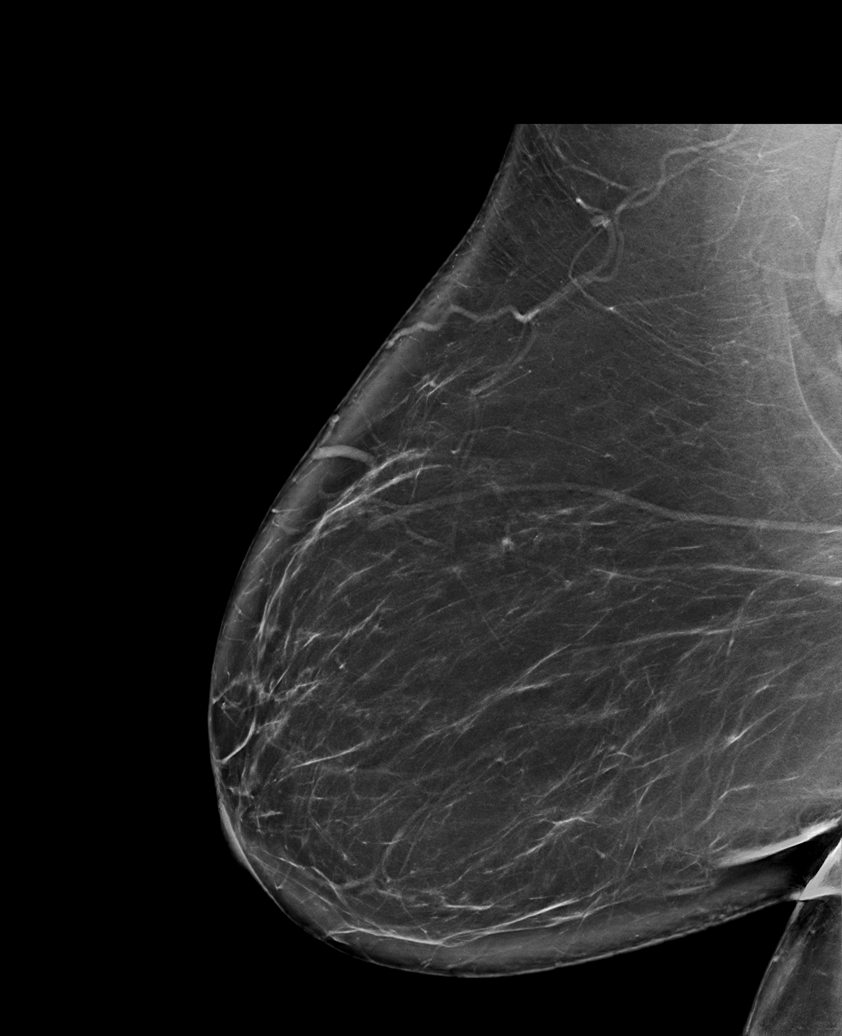

[L CC synth-2D (1 of 3)]
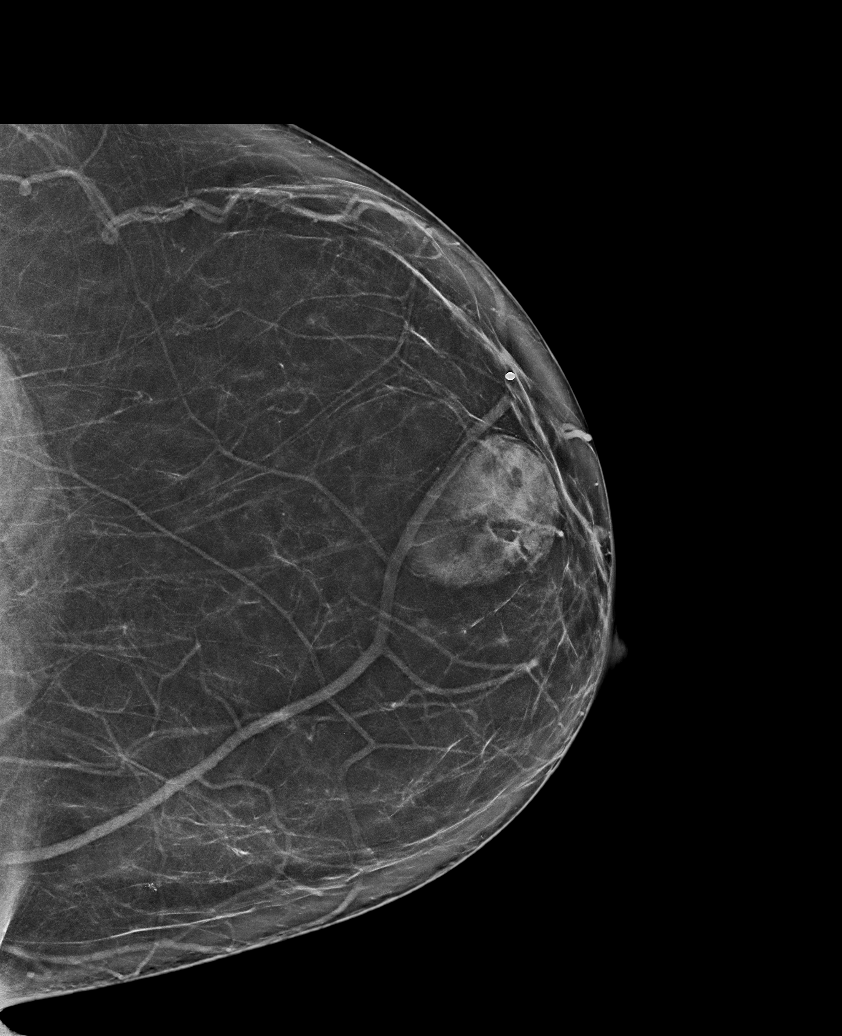

[L CC synth-2D (2 of 3)]
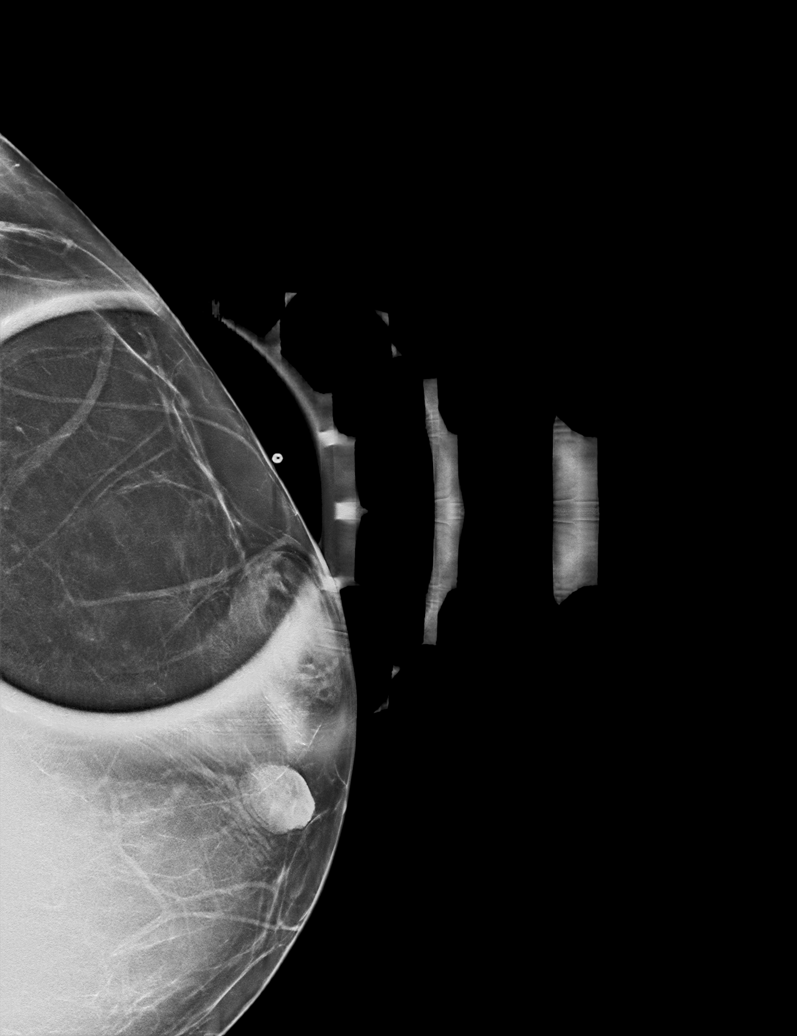

[L CC synth-2D (3 of 3)]
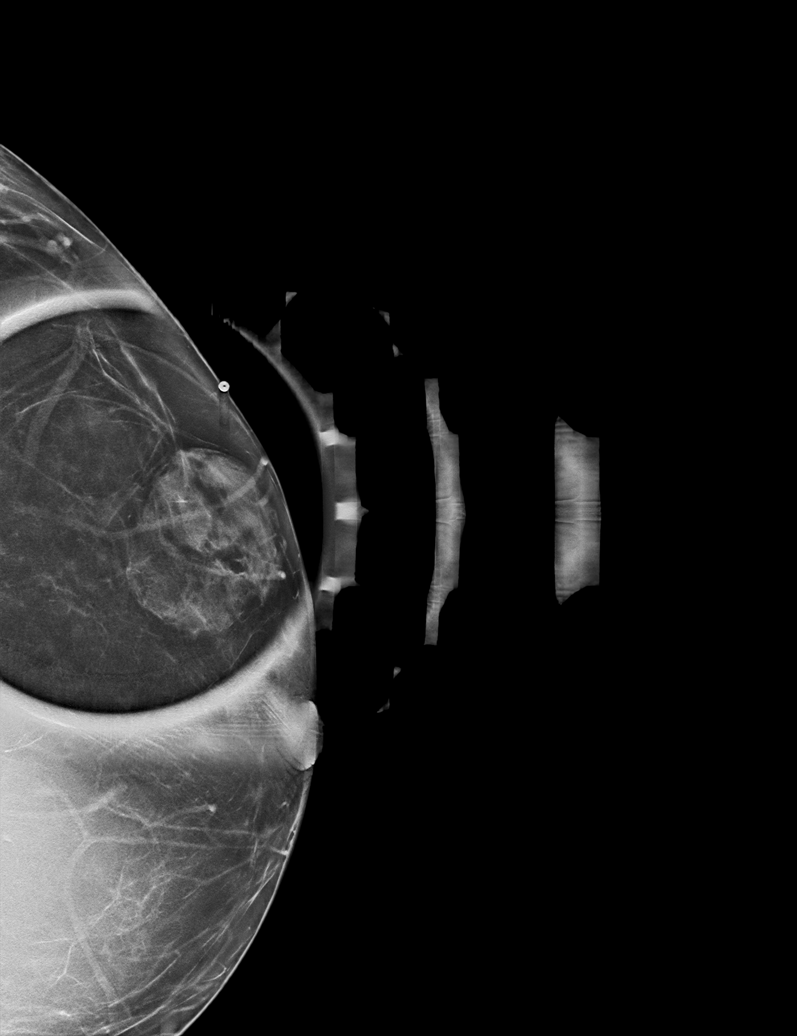

[R CC synth-2D]
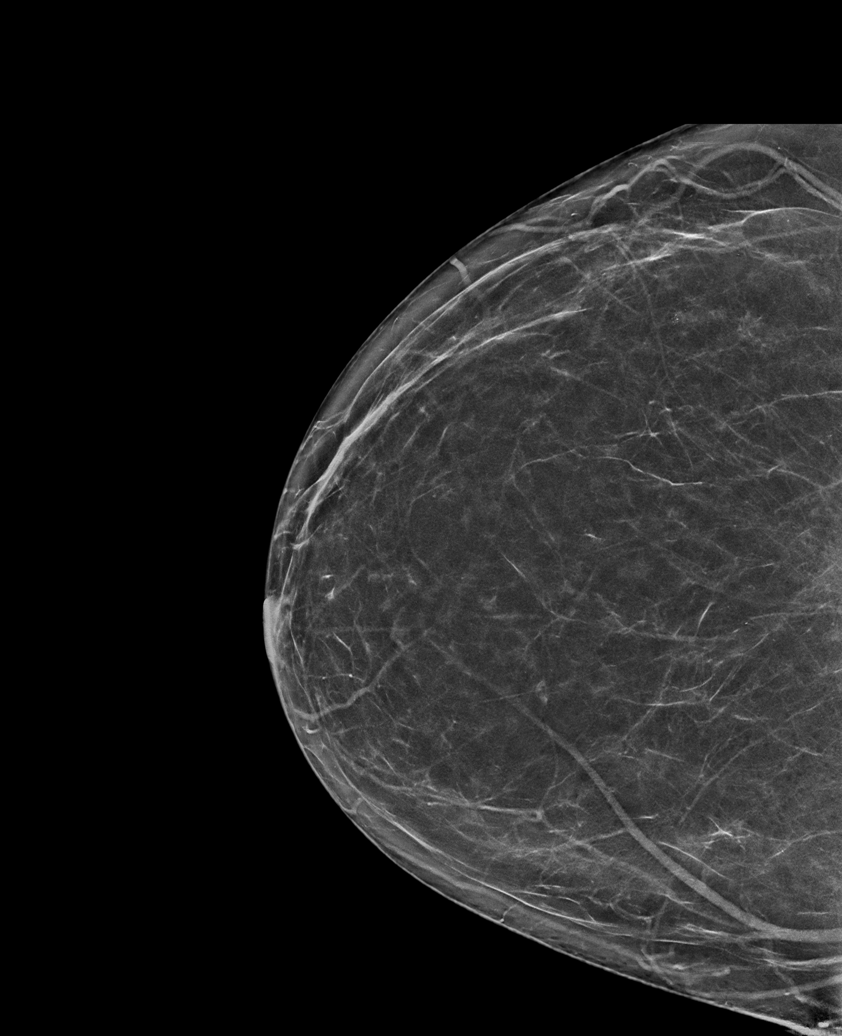

[6 of 36 positions shown; findings below may reference images not displayed]

FINDINGS: Well-circumscribed oval mass in the 5 o'clock region of the left
breast, anterior third is mammographically stable and has internal
fat density. On mammography, it measures approximately 3.9 cm
greatest diameter, stable. It is unchanged dating back to 1007 and
is benign. No new or suspicious mass, suspicious microcalcification
or architectural distortion is identified in either breast to
suggest malignancy.

Mammographic images were processed with CAD.
IMPRESSION: No evidence of malignancy in either breast. Mammographically stable
fibroadenolipoma in the 5 o'clock region of the left breast.

The patient requests a surgical consultation to discuss excision of
the tender palpable mass. Our office can assist with making a
surgical consultation appointment for the patient.

RECOMMENDATION:
Screening mammogram in one year.(Code:[id number]-E-983)

I have discussed the findings and recommendations with the patient.
Results were also provided in writing at the conclusion of the
visit. If applicable, a reminder letter will be sent to the patient
regarding the next appointment.

BI-RADS CATEGORY  2: Benign.

ADDENDUM:
Surgical consultation has been arranged with Dr. Rausel Ganzalez at
[REDACTED] on July 03, 2018.

Gicuta Marinela Calestru, RN on 06/22/2018.

*** End of Addendum ***

## 2019-08-16 ENCOUNTER — Telehealth: Payer: Self-pay

## 2019-08-16 NOTE — Telephone Encounter (Signed)
I called patient to see if she is taking diclofenac and naproxen and if so she is to take only one or the other. YRL,RMA

## 2019-08-31 ENCOUNTER — Telehealth: Payer: Self-pay

## 2019-08-31 NOTE — Telephone Encounter (Signed)
Patient came to the office with [facility] paper requesting that we fax over her letter and certificate to 4 different numbers. I advised pt I can fax the letter over and I gave her a copy of the letter so she can mail it or bring it to the 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 YRL,RMA

## 2019-09-14 ENCOUNTER — Telehealth: Payer: Self-pay

## 2019-09-14 NOTE — Telephone Encounter (Signed)
Looked in chart to see if pt had filled lisinopril

## 2019-09-21 ENCOUNTER — Encounter: Payer: Self-pay | Admitting: Nurse Practitioner

## 2019-09-21 ENCOUNTER — Ambulatory Visit: Payer: BC Managed Care – PPO | Admitting: Nurse Practitioner

## 2019-09-21 ENCOUNTER — Other Ambulatory Visit: Payer: Self-pay

## 2019-09-21 ENCOUNTER — Other Ambulatory Visit: Payer: Self-pay | Admitting: Nurse Practitioner

## 2019-09-21 VITALS — BP 150/84 | HR 74 | Temp 98.0°F | Ht 64.0 in | Wt 335.2 lb

## 2019-09-21 DIAGNOSIS — Z1211 Encounter for screening for malignant neoplasm of colon: Secondary | ICD-10-CM

## 2019-09-21 DIAGNOSIS — Z6841 Body Mass Index (BMI) 40.0 and over, adult: Secondary | ICD-10-CM

## 2019-09-21 DIAGNOSIS — E782 Mixed hyperlipidemia: Secondary | ICD-10-CM

## 2019-09-21 DIAGNOSIS — E559 Vitamin D deficiency, unspecified: Secondary | ICD-10-CM

## 2019-09-21 DIAGNOSIS — I10 Essential (primary) hypertension: Secondary | ICD-10-CM | POA: Diagnosis not present

## 2019-09-21 DIAGNOSIS — D509 Iron deficiency anemia, unspecified: Secondary | ICD-10-CM
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
## 2019-09-22 LAB — BMP8+EGFR
BUN/Creatinine Ratio: 25 (ref 12–28)
BUN: 24 mg/dL (ref 8–27)
CO2: 27 mmol/L (ref 20–29)
Calcium: 9.3 mg/dL (ref 8.7–10.3)
Chloride: 103 mmol/L (ref 96–106)
Creatinine, Ser: 0.97 mg/dL (ref 0.57–1.00)
GFR calc Af Amer: 72 mL/min/1.73
GFR calc non Af Amer: 63 mL/min/1.73
Glucose: 87 mg/dL (ref 65–99)
Potassium: 4.1 mmol/L (ref 3.5–5.2)
Sodium: 144 mmol/L (ref 134–144)

## 2019-09-22 LAB — VITAMIN D 25 HYDROXY (VIT D DEFICIENCY, FRACTURES): Vit D, 25-Hydroxy: 24.9 ng/mL — ABNORMAL LOW (ref 30.0–100.0)

## 2019-09-22 LAB — IRON,TIBC AND FERRITIN PANEL
Ferritin: 193 ng/mL — ABNORMAL HIGH (ref 15–150)
Iron Saturation: 18 % (ref 15–55)
Iron: 51 ug/dL (ref 27–139)
Total Iron Binding Capacity: 278 ug/dL (ref 250–450)
UIBC: 227 ug/dL (ref 118–369)

## 2019-09-22 LAB — CBC
Hematocrit: 31.9 % — ABNORMAL LOW (ref 34.0–46.6)
Hemoglobin: 10.1 g/dL — ABNORMAL LOW (ref 11.1–15.9)
MCH: 26 pg — ABNORMAL LOW (ref 26.6–33.0)
MCHC: 31.7 g/dL (ref 31.5–35.7)
MCV: 82 fL (ref 79–97)
Platelets: 223 10*3/uL (ref 150–450)
RBC: 3.88 x10E6/uL (ref 3.77–5.28)
RDW: 13.1 % (ref 11.7–15.4)
WBC: 5.5 10*3/uL (ref 3.4–10.8)

## 2019-11-22 LAB — COLOGUARD: Cologuard: NEGATIVE

## 2019-12-05 LAB — COLOGUARD: COLOGUARD: NEGATIVE

## 2019-12-23 ENCOUNTER — Ambulatory Visit: Payer: BC Managed Care – PPO | Admitting: Nurse Practitioner

## 2020-01-21 ENCOUNTER — Emergency Department (HOSPITAL_BASED_OUTPATIENT_CLINIC_OR_DEPARTMENT_OTHER): Payer: BC Managed Care – PPO

## 2020-01-21 ENCOUNTER — Other Ambulatory Visit: Payer: Self-pay

## 2020-01-21 ENCOUNTER — Encounter (HOSPITAL_BASED_OUTPATIENT_CLINIC_OR_DEPARTMENT_OTHER): Payer: Self-pay | Admitting: *Deleted

## 2020-01-21 ENCOUNTER — Observation Stay (HOSPITAL_BASED_OUTPATIENT_CLINIC_OR_DEPARTMENT_OTHER)
Admission: EM | Admit: 2020-01-21 | Discharge: 2020-01-22 | Disposition: A | Discharge status: Home / Self Care | Payer: BC Managed Care – PPO | Attending: Internal Medicine | Admitting: Internal Medicine

## 2020-01-21 DIAGNOSIS — E876 Hypokalemia: Secondary | ICD-10-CM | POA: Diagnosis not present

## 2020-01-21 DIAGNOSIS — Z79899 Other long term (current) drug therapy: Secondary | ICD-10-CM | POA: Diagnosis not present

## 2020-01-21 DIAGNOSIS — R072 Precordial pain: Secondary | ICD-10-CM

## 2020-01-21 DIAGNOSIS — R0602 Shortness of breath: Secondary | ICD-10-CM

## 2020-01-21 DIAGNOSIS — N3281 Overactive bladder: Secondary | ICD-10-CM | POA: Insufficient documentation

## 2020-01-21 DIAGNOSIS — I2609 Other pulmonary embolism with acute cor pulmonale: Secondary | ICD-10-CM | POA: Diagnosis not present

## 2020-01-21 DIAGNOSIS — Z20822 Contact with and (suspected) exposure to covid-19: Secondary | ICD-10-CM | POA: Diagnosis not present

## 2020-01-21 DIAGNOSIS — I2699 Other pulmonary embolism without acute cor pulmonale: Secondary | ICD-10-CM

## 2020-01-21 DIAGNOSIS — I2694 Multiple subsegmental pulmonary emboli without acute cor pulmonale: Secondary | ICD-10-CM

## 2020-01-21 DIAGNOSIS — K219 Gastro-esophageal reflux disease without esophagitis: Secondary | ICD-10-CM | POA: Diagnosis not present

## 2020-01-21 DIAGNOSIS — E119 Type 2 diabetes mellitus without complications: Secondary | ICD-10-CM | POA: Diagnosis not present

## 2020-01-21 DIAGNOSIS — Z6841 Body Mass Index (BMI) 40.0 and over, adult: Secondary | ICD-10-CM | POA: Diagnosis not present

## 2020-01-21 DIAGNOSIS — I10 Essential (primary) hypertension: Secondary | ICD-10-CM | POA: Diagnosis not present

## 2020-01-21 DIAGNOSIS — R0789 Other chest pain: Secondary | ICD-10-CM | POA: Diagnosis not present

## 2020-01-21 DIAGNOSIS — R9431 Abnormal electrocardiogram [ECG] [EKG]: Secondary | ICD-10-CM

## 2020-01-21 HISTORY — DX: Other pulmonary embolism without acute cor pulmonale: I26.99

## 2020-01-21 LAB — CBC
HCT: 33.4 % — ABNORMAL LOW (ref 36.0–46.0)
Hemoglobin: 10.6 g/dL — ABNORMAL LOW (ref 12.0–15.0)
MCH: 26.4 pg (ref 26.0–34.0)
MCHC: 31.7 g/dL (ref 30.0–36.0)
MCV: 83.3 fL (ref 80.0–100.0)
Platelets: 195 10*3/uL (ref 150–400)
RBC: 4.01 MIL/uL (ref 3.87–5.11)
RDW: 13.4 % (ref 11.5–15.5)
WBC: 7.1 10*3/uL (ref 4.0–10.5)
nRBC: 0 % (ref 0.0–0.2)

## 2020-01-21 LAB — BASIC METABOLIC PANEL
Anion gap: 12 (ref 5–15)
BUN: 23 mg/dL (ref 8–23)
CO2: 25 mmol/L (ref 22–32)
Calcium: 8.9 mg/dL (ref 8.9–10.3)
Chloride: 104 mmol/L (ref 98–111)
Creatinine, Ser: 1.18 mg/dL — ABNORMAL HIGH (ref 0.44–1.00)
GFR calc Af Amer: 57 mL/min — ABNORMAL LOW (ref >60)
GFR calc non Af Amer: 49 mL/min — ABNORMAL LOW (ref >60)
Glucose, Bld: 111 mg/dL — ABNORMAL HIGH (ref 70–99)
Potassium: 3.2 mmol/L — ABNORMAL LOW (ref 3.5–5.1)
Sodium: 141 mmol/L (ref 135–145)

## 2020-01-21 LAB — D-DIMER, QUANTITATIVE: D-Dimer, Quant: 9.39 ug/mL-FEU — ABNORMAL HIGH (ref 0.00–0.50)

## 2020-01-21 LAB — TROPONIN I (HIGH SENSITIVITY)
Troponin I (High Sensitivity): 26 ng/L — ABNORMAL HIGH (ref <18)
Troponin I (High Sensitivity): 24 ng/L — ABNORMAL HIGH (ref <18)

## 2020-01-21 LAB — BRAIN NATRIURETIC PEPTIDE: B Natriuretic Peptide: 50.8 pg/mL (ref 0.0–100.0)

## 2020-01-21 LAB — SARS CORONAVIRUS 2 BY RT PCR (HOSPITAL ORDER, PERFORMED IN ~~LOC~~ HOSPITAL LAB): SARS Coronavirus 2: NEGATIVE

## 2020-01-21 MED ORDER — POTASSIUM CHLORIDE CRYS ER 20 MEQ PO TBCR
40.0000 meq | EXTENDED_RELEASE_TABLET | Freq: Once | ORAL | Status: AC
  Administered 2020-01-22: 40 meq via ORAL
  Filled 2020-01-21: qty 2

## 2020-01-21 MED ORDER — EZETIMIBE-SIMVASTATIN 10-40 MG PO TABS
1.0000 | ORAL_TABLET | Freq: Every day | ORAL | Status: DC
  Administered 2020-01-22: 1 via ORAL
  Filled 2020-01-21: qty 1

## 2020-01-21 MED ORDER — HEPARIN BOLUS VIA INFUSION
5000.0000 [IU] | Freq: Once | INTRAVENOUS | Status: AC
  Administered 2020-01-21: 5000 [IU] via INTRAVENOUS

## 2020-01-21 MED ORDER — LISINOPRIL 20 MG PO TABS
20.0000 mg | ORAL_TABLET | Freq: Every day | ORAL | Status: DC
  Administered 2020-01-22: 20 mg via ORAL
  Filled 2020-01-21: qty 1

## 2020-01-21 MED ORDER — HEPARIN (PORCINE) 25000 UT/250ML-% IV SOLN
1800.0000 [IU]/h | INTRAVENOUS | Status: DC
  Administered 2020-01-21: 1800 [IU]/h via INTRAVENOUS
  Filled 2020-01-21: qty 250

## 2020-01-21 MED ORDER — PANTOPRAZOLE SODIUM 40 MG PO TBEC
40.0000 mg | DELAYED_RELEASE_TABLET | Freq: Every day | ORAL | Status: DC
  Administered 2020-01-22: 40 mg via ORAL
  Filled 2020-01-21: qty 1

## 2020-01-21 MED ORDER — LISINOPRIL-HYDROCHLOROTHIAZIDE 20-25 MG PO TABS: 1.0000 | ORAL_TABLET | Freq: Every day | ORAL | Status: DC

## 2020-01-21 MED ORDER — HYDROCHLOROTHIAZIDE 25 MG PO TABS: 25.0000 mg | ORAL_TABLET | Freq: Every day | ORAL | Status: DC

## 2020-01-21 MED ORDER — SODIUM CHLORIDE 0.9% FLUSH
3.0000 mL | Freq: Once | INTRAVENOUS | Status: DC
  Filled 2020-01-21: qty 3

## 2020-01-21 MED ORDER — DARIFENACIN HYDROBROMIDE ER 7.5 MG PO TB24
7.5000 mg | ORAL_TABLET | Freq: Every day | ORAL | Status: DC
  Administered 2020-01-22: 7.5 mg via ORAL
  Filled 2020-01-21: qty 1

## 2020-01-21 MED ORDER — IOHEXOL 350 MG/ML SOLN
100.0000 mL | Freq: Once | INTRAVENOUS | Status: AC | PRN
  Administered 2020-01-21: 85 mL via INTRAVENOUS

## 2020-01-21 MED ORDER — HYDROCHLOROTHIAZIDE 25 MG PO TABS
25.0000 mg | ORAL_TABLET | Freq: Every day | ORAL | Status: DC
  Administered 2020-01-22: 25 mg via ORAL
  Filled 2020-01-21: qty 1

## 2020-01-21 NOTE — Progress Notes (Signed)
ANTICOAGULATION CONSULT NOTE - Initial Consult  Pharmacy Consult for heparin Indication: pulmonary embolus  Allergies  Allergen Reactions  . Penicillins     REACTION: hives    Patient Measurements: Height: 5\' 4"  (162.6 cm) Weight: (!) 153.3 kg (338 lb) IBW/kg (Calculated) : 54.7  Vital Signs: Temp: 98.6 F (37 C) (07/09 1535) Temp Source: Oral (07/09 1535) BP: 147/91 (07/09 1717) Pulse Rate: 79 (07/09 1717)  Labs: Recent Labs    01/21/20 1542 01/21/20 1741  HGB 10.6*  --   HCT 33.4*  --   PLT 195  --   CREATININE 1.18*  --   TROPONINIHS 26* 24*    Estimated Creatinine Clearance: 73.4 mL/min (A) (by C-G formula based on SCr of 1.18 mg/dL (H)).   Medical History: Past Medical History:  Diagnosis Date  . Acid reflux   . Diabetes mellitus without complication (HCC)    Pt states she is prediabetic  . Hypertension   . OSA (obstructive sleep apnea)     Medications:  (Not in a hospital admission)   Assessment: 40 YOF with new bilateral pulmonary emboli to start IV heparin. H/H low, Plt wnl. [place] 1.18  Goal of Therapy:  Heparin level 0.3-0.7 units/ml Monitor platelets by anticoagulation protocol: Yes   Plan:  -Heparin 5000 units IV bolus followed by heparin infusion at 2000 units/hr  -F/u 6 hr HL  -Monitor daily HL, CBC and s/s of bleeding   69, PharmD., BCPS, BCCCP Clinical Pharmacist Clinical phone for 01/21/20 until 11:30pm: [phone] If after 11:30pm, please refer to [facility] for unit-specific pharmacist

## 2020-01-21 NOTE — Plan of Care (Signed)
On unit, stable. Heparin running at 18mL/hr. Stable

## 2020-01-21 NOTE — ED Provider Notes (Signed)
Emergency Department Provider Note   I have reviewed the triage vital signs and the nursing notes.   HISTORY  Chief Complaint No chief complaint on file.   HPI Joan [name] is a 64 y.o. female with PMH of HTN, DM, OSA, and elevated BMI presents to the emergency room evaluation of shortness of breath over the past 4 days.  Patient had her second Covid vaccine on June 30 and states that 1 day later she began feeling shortness of breath.  She has not felt chest discomfort until today.  She describes a left-sided discomfort which also radiates to her back and somewhat in her upper abdomen.  She is now experiencing vomiting, nausea, diarrhea.  No change with eating.  Shortness of breath is worse with exertion.  She is now experiencing throat soreness or tightness.  No congestion or cough.  No fevers.  No sick contacts. Denies asthma history. Does not use CPAP at night. Does not use home O2.    Past Medical History:  Diagnosis Date  . Acid reflux   . Diabetes mellitus without complication (HCC)    Pt states she is prediabetic  . Hypertension   . OSA (obstructive sleep apnea)     Patient Active Problem List   Diagnosis Date Noted  . Acute pulmonary embolism (HCC) 01/21/2020  . Fall from slipping on ice 07/26/2019  . Abnormal glucose 05/26/2019  . Mixed hyperlipidemia 05/26/2019  . Chronic pain of both knees 11/02/2018  . Breast lump on left side at 4 o'clock position 06/28/2018  . Functional urinary incontinence 06/28/2018  . Essential hypertension 06/28/2018  . OBSTRUCTIVE SLEEP APNEA 10/10/2009    Past Surgical History:  Procedure Laterality Date  . ABDOMINAL HYSTERECTOMY    . TONSILECTOMY, ADENOIDECTOMY, BILATERAL MYRINGOTOMY AND TUBES      Allergies Penicillins  Family History  Problem Relation Age of Onset  . Heart disease Mother   . Heart disease Father   . Prostate cancer Father     Social History Social History   Tobacco Use  . Smoking status:  Never Smoker  . Smokeless tobacco: Never Used  Substance Use Topics  . Alcohol use: No  . Drug use: No    Review of Systems  Constitutional: No fever/chills Eyes: No visual changes. ENT: No sore throat. Cardiovascular: Positive chest pain. Respiratory: Positive shortness of breath. Gastrointestinal: No abdominal pain.  No nausea, no vomiting.  No diarrhea.  No constipation. Genitourinary: Negative for dysuria. Musculoskeletal: Negative for back pain. Skin: Negative for rash. Neurological: Negative for headaches, focal weakness or numbness.  10-point ROS otherwise negative.  ____________________________________________   PHYSICAL EXAM:  VITAL SIGNS: ED Triage Vitals  Enc Vitals Group     BP 01/21/20 1535 (!) 155/82     Pulse Rate 01/21/20 1535 97     Resp 01/21/20 1535 (!) 32     Temp 01/21/20 1535 98.6 F (37 C)     Temp Source 01/21/20 1535 Oral     SpO2 01/21/20 1535 (!) 86 %     Weight 01/21/20 1539 (!) 338 lb (153.3 kg)     Height 01/21/20 1539 5\' 4"  (1.626 m)   Constitutional: Alert and oriented. Well appearing and in no acute distress. Eyes: Conjunctivae are normal.  Head: Atraumatic. Nose: No congestion/rhinnorhea. Mouth/Throat: Mucous membranes are moist.  Neck: No stridor.  Cardiovascular: Normal rate, regular rhythm. Good peripheral circulation. Grossly normal heart sounds.   Respiratory: Normal respiratory effort.  No retractions. Lungs CTAB. No  wheezing.  Gastrointestinal: Soft and nontender. No distention.  Musculoskeletal: No lower extremity tenderness nor edema. No gross deformities of extremities. Neurologic:  Normal speech and language.  Skin:  Skin is warm, dry and intact. No rash noted.  ____________________________________________   LABS (all labs ordered are listed, but only abnormal results are displayed)  Labs Reviewed  BASIC METABOLIC PANEL - Abnormal; Notable for the following components:      Result Value   Potassium 3.2 (*)     Glucose, Bld 111 (*)    Creatinine, Ser 1.18 (*)    GFR calc non Af Amer 49 (*)    GFR calc Af Amer 57 (*)    All other components within normal limits  CBC - Abnormal; Notable for the following components:   Hemoglobin 10.6 (*)    HCT 33.4 (*)    All other components within normal limits  D-DIMER, QUANTITATIVE (NOT AT [facility]) - Abnormal; Notable for the following components:   D-Dimer, Quant 9.39 (*)    All other components within normal limits  CBC - Abnormal; Notable for the following components:   RBC 3.69 (*)    Hemoglobin 10.0 (*)    HCT 30.9 (*)    All other components within normal limits  HEPARIN LEVEL (UNFRACTIONATED) - Abnormal; Notable for the following components:   Heparin Unfractionated 0.71 (*)    All other components within normal limits  TROPONIN I (HIGH SENSITIVITY) - Abnormal; Notable for the following components:   Troponin I (High Sensitivity) 26 (*)    All other components within normal limits  TROPONIN I (HIGH SENSITIVITY) - Abnormal; Notable for the following components:   Troponin I (High Sensitivity) 24 (*)    All other components within normal limits  SARS CORONAVIRUS 2 BY RT PCR (HOSPITAL ORDER, PERFORMED IN [facility])  BRAIN NATRIURETIC PEPTIDE  MAGNESIUM  HIV ANTIBODY (ROUTINE TESTING W REFLEX)   ____________________________________________  EKG   EKG Interpretation  Date/Time:  Friday January 21 2020 15:50:17 EDT Ventricular Rate:  101 PR Interval:    QRS Duration: 96 QT Interval:  388 QTC Calculation: 503 R Axis:   22 Text Interpretation: Sinus tachycardia Prolonged QT interval No STEMI Confirmed by Alona Bene [phone]) on 01/21/2020 4:08:16 PM       ____________________________________________  RADIOLOGY  DG Chest 2 View  Result Date: 01/21/2020 CLINICAL DATA:  Chest pain.  Shortness of breath. EXAM: CHEST - 2 VIEW COMPARISON:  September 15, 2009 FINDINGS: The heart size is mildly enlarged. The hila and mediastinum are normal.  Platelike opacity in left base was not seen previously. No pneumothorax. No nodules or masses. No other infiltrates identified. IMPRESSION: Streaky opacity in left base is probably atelectasis or scar. No other acute abnormalities. Electronically Signed   By: Gerome Sam III M.D   On: 01/21/2020 16:27   CT Angio Chest PE W and/or Wo Contrast  Result Date: 01/21/2020 CLINICAL DATA:  Elevated D-dimer, epigastric pain for 6 hours, shortness of breath EXAM: CT ANGIOGRAPHY CHEST WITH CONTRAST TECHNIQUE: Multidetector CT imaging of the chest was performed using the standard protocol during bolus administration of intravenous contrast. Multiplanar CT image reconstructions and MIPs were obtained to evaluate the vascular anatomy. CONTRAST:  3mL OMNIPAQUE IOHEXOL 350 MG/ML SOLN COMPARISON:  01/21/2020 FINDINGS: Cardiovascular: This is a technically adequate evaluation of the pulmonary vasculature. There are bilateral segmental pulmonary emboli most pronounced within the left upper and right lower lobes. There is no evidence of right heart strain.  No pericardial effusion.  The thoracic aorta is unremarkable. Mediastinum/Nodes: No enlarged mediastinal, hilar, or axillary lymph nodes. Thyroid gland, trachea, and esophagus demonstrate no significant findings. Lungs/Pleura: There is scattered hypoventilatory changes at the lung bases. No acute airspace disease, effusion, or pneumothorax. Central airways are patent. Upper Abdomen: Calcified gallstones are identified without cholecystitis. Remainder of the upper abdomen is unremarkable. Musculoskeletal: No acute or destructive bony lesions. Reconstructed images demonstrate no additional findings. Review of the MIP images confirms the above findings. IMPRESSION: 1. Bilateral segmental pulmonary emboli, with no evidence of right heart strain. These results were called by telephone at the time of interpretation on 01/21/2020 at 7:08 pm to provider Aysiah Jurado , who verbally  acknowledged these results. Electronically Signed   By: Sharlet Salina M.D.   On: 01/21/2020 19:10   VAS [place] LOWER EXTREMITY VENOUS (DVT)  Result Date: 01/22/2020  Lower Venous DVTStudy Indications: Pulmonary embolism.  Limitations: Body habitus and poor ultrasound/tissue interface. Comparison Study: No prior study Performing Technologist: Gertie Fey MHA, RDMS, RVT, RDCS  Examination Guidelines: A complete evaluation includes B-mode imaging, spectral Doppler, color Doppler, and power Doppler as needed of all accessible portions of each vessel. Bilateral testing is considered an integral part of a complete examination. Limited examinations for reoccurring indications may be performed as noted. The reflux portion of the exam is performed with the patient in reverse Trendelenburg.  +---------+---------------+---------+-----------+----------+--------------+ RIGHT    CompressibilityPhasicitySpontaneityPropertiesThrombus Aging +---------+---------------+---------+-----------+----------+--------------+ CFV      Full           Yes      Yes                                 +---------+---------------+---------+-----------+----------+--------------+ SFJ      Full                                                        +---------+---------------+---------+-----------+----------+--------------+ FV Prox  Full                                                        +---------+---------------+---------+-----------+----------+--------------+ FV Mid   Full                                                        +---------+---------------+---------+-----------+----------+--------------+ FV DistalFull                                                        +---------+---------------+---------+-----------+----------+--------------+ PFV      Full                                                        +---------+---------------+---------+-----------+----------+--------------+  POP       Full           Yes      Yes                                 +---------+---------------+---------+-----------+----------+--------------+ PTV      Full                                                        +---------+---------------+---------+-----------+----------+--------------+ PERO     Full                                                        +---------+---------------+---------+-----------+----------+--------------+   +---------+---------------+---------+-----------+----------+-----------------+ LEFT     CompressibilityPhasicitySpontaneityPropertiesThrombus Aging    +---------+---------------+---------+-----------+----------+-----------------+ CFV      Full           Yes      Yes                                    +---------+---------------+---------+-----------+----------+-----------------+ SFJ      Full                                                           +---------+---------------+---------+-----------+----------+-----------------+ FV Prox  Full                                                           +---------+---------------+---------+-----------+----------+-----------------+ FV Mid   Partial                 No                   Age Indeterminate +---------+---------------+---------+-----------+----------+-----------------+ FV DistalFull                                                           +---------+---------------+---------+-----------+----------+-----------------+ PFV      Full                                                           +---------+---------------+---------+-----------+----------+-----------------+ POP      Full           Yes      Yes                                    +---------+---------------+---------+-----------+----------+-----------------+  PTV      Full                                                           +---------+---------------+---------+-----------+----------+-----------------+  PERO     Full                                                           +---------+---------------+---------+-----------+----------+-----------------+     Summary: RIGHT: - There is no evidence of deep vein thrombosis in the lower extremity.  - No cystic structure found in the popliteal fossa.  LEFT: - Findings consistent with age indeterminate deep vein thrombosis involving the left femoral vein. - No cystic structure found in the popliteal fossa.  *See table(s) above for measurements and observations.    Preliminary     ____________________________________________   PROCEDURES  Procedure(s) performed:   Procedures  CRITICAL CARE Performed by: Maia Plan Total critical care time: 35 minutes Critical care time was exclusive of separately billable procedures and treating other patients. Critical care was necessary to treat or prevent imminent or life-threatening deterioration. Critical care was time spent personally by me on the following activities: development of treatment plan with patient and/or surrogate as well as nursing, discussions with consultants, evaluation of patient's response to treatment, examination of patient, obtaining history from patient or surrogate, ordering and performing treatments and interventions, ordering and review of laboratory studies, ordering and review of radiographic studies, pulse oximetry and re-evaluation of patient's condition.  Alona Bene, MD Emergency Medicine  ____________________________________________   INITIAL IMPRESSION / ASSESSMENT AND PLAN / ED COURSE  Pertinent labs & imaging results that were available during my care of the patient were reviewed by me and considered in my medical decision making (see chart for details).   Patient presents emergency department evaluation of shortness of breath worsening over the past several days.  She is developed some atypical chest pain today.  No reproducible discomfort on abdominal exam or  palpation of the chest wall.  ACS does remain on the differential with several risk factors noted.  Unable to apply PERC rule with associated tachycardia and patient's age.  I sent a D-dimer in addition to chest x-ray.  EKG shows sinus tachycardia with no acute ischemic change.  Plain films and labs reviewed.  D-dimer significantly elevated and patient sent for CTA of the chest.  CT with acute PE which correlates with patient's symptoms.  She is not requiring oxygen at rest and no heart strain visible on CT imaging.  No hypotension or altered mental status.  Starting heparin and will admit.   Discussed patient's case with [facility] to request admission. Patient and family (if present) updated with plan. Care transferred to [facility] service.  I reviewed all nursing notes, vitals, pertinent old records, EKGs, labs, imaging (as available).  ____________________________________________  FINAL CLINICAL IMPRESSION(S) / ED DIAGNOSES  Final diagnoses:  SOB (shortness of breath)  Precordial chest pain  Prolonged Q-T interval on ECG  Multiple subsegmental pulmonary emboli without acute cor pulmonale (HCC)     MEDICATIONS GIVEN DURING THIS VISIT:  Medications  ezetimibe-simvastatin (VYTORIN) 10-40 MG per  tablet 1 tablet (1 tablet Oral Given 01/22/20 1027)  pantoprazole (PROTONIX) EC tablet 40 mg (40 mg Oral Given 01/22/20 1028)  darifenacin (ENABLEX) 24 hr tablet 7.5 mg (7.5 mg Oral Given 01/22/20 1028)  lisinopril (ZESTRIL) tablet 20 mg (20 mg Oral Given 01/22/20 1028)    And  hydrochlorothiazide (HYDRODIURIL) tablet 25 mg (25 mg Oral Given 01/22/20 1028)  heparin ADULT infusion 100 units/mL (25000 units/226mL sodium chloride 0.45%) (0 Units/hr Intravenous Stopped 01/22/20 1022)  apixaban (ELIQUIS) tablet 10 mg (10 mg Oral Given 01/22/20 1027)    Followed by  apixaban (ELIQUIS) tablet 5 mg (has no administration in time range)  iohexol (OMNIPAQUE) 350 MG/ML injection 100 mL (85 mLs Intravenous Contrast Given  01/21/20 1836)  heparin bolus via infusion 5,000 Units (5,000 Units Intravenous Bolus from Bag 01/21/20 1941)  potassium chloride SA (KLOR-CON) CR tablet 40 mEq (40 mEq Oral Given 01/22/20 0053)     NEW OUTPATIENT MEDICATIONS STARTED DURING THIS VISIT:  Current Discharge Medication List    START taking these medications   Details  APIXABAN (ELIQUIS) VTE STARTER PACK (10MG  AND 5MG ) Take as directed on package: start with two-5mg  tablets twice daily for 7 days. On day 8, switch to one-5mg  tablet twice daily. Qty: 1 each, Refills: 0        Note:  This document was prepared using Dragon voice recognition software and may include unintentional dictation errors.  , MD, [facility] Emergency Medicine    Dionte Blaustein, Alona Bene, MD 01/22/20 [id number]

## 2020-01-21 NOTE — ED Triage Notes (Signed)
Chest pain and SOB x 4 days. SOB after walking from waiting room to tx room 12. She is able to speak in complete sentences.

## 2020-01-21 NOTE — H&P (Addendum)
History and Physical    MARCELLA CHARLSON DQQ:[id number] DOB: 06-04-1956 DOA: 01/21/2020  PCP: [name], FNP  Patient coming from: home by way of [facility]   Chief Complaint: dyspnea on exertion  HPI: Joan [name] is a 64 y.o. female with medical history significant for morbid obesity, dm, htn, osa not on cpap, who presents with above.  Symptoms began insidiously 5 days ago. Began as mild dyspnea on exertion that slowly worsened. None at rest. Fever today as well. Coughs some, no hemoptysis, when exerts self. Some chest pressure at those times as well. No new leg pain or swelling. No recent hospitalization or other immobilization. Mother has history of dvt, and daughter has antiphospholipid syndrome. No personal history dvt/pe. S/p hysterectomy, says overdue for colorectal cancer screening and breast cancer screening.  ED Course: labs, imaging, heparin  Review of Systems: As per HPI otherwise 10 point review of systems negative.    Past Medical History:  Diagnosis Date  . Acid reflux   . Diabetes mellitus without complication (HCC)    Pt states she is prediabetic  . Hypertension   . OSA (obstructive sleep apnea)     Past Surgical History:  Procedure Laterality Date  . ABDOMINAL HYSTERECTOMY    . TONSILECTOMY, ADENOIDECTOMY, BILATERAL MYRINGOTOMY AND TUBES       reports that she has never smoked. She has never used smokeless tobacco. She reports that she does not drink alcohol and does not use drugs.  Allergies  Allergen Reactions  . Penicillins     REACTION: hives    Family History  Problem Relation Age of Onset  . Heart disease Mother   . Heart disease Father   . Prostate cancer Father     Prior to Admission medications   Medication Sig Start Date End Date Taking? Authorizing Provider  lisinopril-hydrochlorothiazide (ZESTORETIC) 20-25 MG tablet Take 1 tablet by mouth daily. 05/26/19  Yes [name], FNP  cholecalciferol (VITAMIN D3) 25 MCG (1000 UT)  tablet Take 1,000 Units by mouth daily.    [provider]  CYCLOBENZAPRINE HCL PO Take 1 tablet by mouth at bedtime as needed.    [provider]  diclofenac sodium (VOLTAREN) 1 % GEL Apply 2 g topically 4 (four) times daily. 11/02/18   [name], FNP  ezetimibe-simvastatin (VYTORIN) 10-40 MG tablet Take 1 tablet by mouth once daily 08/05/19   [name], FNP  Fe Fum-FePoly-FA-Vit C-Vit B3 (INTEGRA F) 125-1 MG CAPS Take 1 tablet by mouth daily. 11/02/18   [name], FNP  hydrochlorothiazide (HYDRODIURIL) 25 MG tablet Take 1 tablet by mouth once daily 08/05/19   [name], FNP  Insulin Pen Needle (PEN NEEDLES) 32G X 4 MM MISC 1 each by Does not apply route daily. Use as directed daily with saxenda 08/03/19   [name], FNP  methocarbamol (ROBAXIN) 750 MG tablet Take 750 mg by mouth 2 (two) times daily as needed for muscle spasms.    [provider]  Multiple Vitamins-Minerals (MULTIVITAMIN PO) Take 1 tablet by mouth daily.    [provider]  naproxen (NAPROSYN) 500 MG tablet Take 500 mg by mouth 2 (two) times daily as needed.    [provider]  omeprazole (PRILOSEC) 40 MG capsule Take 40 mg by mouth daily.    [provider]  solifenacin (VESICARE) 10 MG tablet Take 1 tablet by mouth once daily 08/05/19   [name], FNP    Physical Exam: Vitals:   01/21/20 2100 01/21/20  2137 01/21/20 2200 01/21/20 2244  BP: (!) 159/92 (!) 169/100 (!) 173/90 (!) 178/96  Pulse: 90 85 92 92  Resp: (!) 25 20 (!) 24 20  Temp:    (!) 100.6 F (38.1 C)  TempSrc:    Oral  SpO2: 94% 96% 96% 98%  Weight:    (!) 151.9 kg  Height:    5\' 5"  (1.651 m)    Constitutional: No acute distress Head: Atraumatic Eyes: Conjunctiva clear ENM: Moist mucous membranes. Normal dentition.  Neck: Supple Respiratory: Clear to auscultation bilaterally, no wheezing/rales/rhonchi. Normal respiratory effort. No accessory muscle use. . Cardiovascular: Regular  rate and rhythm. No murmurs/rubs/gallops. Abdomen: Non-tender, non-distended. No masses. No rebound or guarding. Positive bowel sounds. Musculoskeletal: No joint deformity upper and lower extremities. Normal ROM, no contractures. Normal muscle tone.  Skin: No rashes, lesions, or ulcers.  Extremities: trace le edema, b/l calf tenderness, no erythema or warmth. Palpable peripheral pulses. Neurologic: Alert, moving all 4 extremities. Psychiatric: Normal insight and judgement.   Labs on Admission: I have personally reviewed following labs and imaging studies  CBC: Recent Labs  Lab 01/21/20 1542  WBC 7.1  HGB 10.6*  HCT 33.4*  MCV 83.3  PLT 195   Basic Metabolic Panel: Recent Labs  Lab 01/21/20 1542  NA 141  K 3.2*  CL 104  CO2 25  GLUCOSE 111*  BUN 23  CREATININE 1.18*  CALCIUM 8.9   GFR: Estimated Creatinine Clearance: 74.1 mL/min (A) (by C-G formula based on SCr of 1.18 mg/dL (H)). Liver Function Tests: No results for input(s): AST, ALT, ALKPHOS, BILITOT, PROT, ALBUMIN in the last 168 hours. No results for input(s): LIPASE, AMYLASE in the last 168 hours. No results for input(s): AMMONIA in the last 168 hours. Coagulation Profile: No results for input(s): INR, PROTIME in the last 168 hours. Cardiac Enzymes: No results for input(s): CKTOTAL, CKMB, CKMBINDEX, TROPONINI in the last 168 hours. BNP (last 3 results) No results for input(s): PROBNP in the last 8760 hours. HbA1C: No results for input(s): HGBA1C in the last 72 hours. CBG: No results for input(s): GLUCAP in the last 168 hours. Lipid Profile: No results for input(s): CHOL, HDL, LDLCALC, TRIG, CHOLHDL, LDLDIRECT in the last 72 hours. Thyroid Function Tests: No results for input(s): TSH, T4TOTAL, FREET4, T3FREE, THYROIDAB in the last 72 hours. Anemia Panel: No results for input(s): VITAMINB12, FOLATE, FERRITIN, TIBC, IRON, RETICCTPCT in the last 72 hours. Urine analysis:    Component Value Date/Time    BILIRUBINUR negative 06/22/2019 1727   PROTEINUR Negative 06/22/2019 1727   UROBILINOGEN 0.2 06/22/2019 1727   NITRITE negative 06/22/2019 1727   LEUKOCYTESUR Negative 06/22/2019 1727    Radiological Exams on Admission: DG Chest 2 View  Result Date: 01/21/2020 CLINICAL DATA:  Chest pain.  Shortness of breath. EXAM: CHEST - 2 VIEW COMPARISON:  September 15, 2009 FINDINGS: The heart size is mildly enlarged. The hila and mediastinum are normal. Platelike opacity in left base was not seen previously. No pneumothorax. No nodules or masses. No other infiltrates identified. IMPRESSION: Streaky opacity in left base is probably atelectasis or scar. No other acute abnormalities. Electronically Signed   By: September 17, 2009 III M.D   On: 01/21/2020 16:27   CT Angio Chest PE W and/or Wo Contrast  Result Date: 01/21/2020 CLINICAL DATA:  Elevated D-dimer, epigastric pain for 6 hours, shortness of breath EXAM: CT ANGIOGRAPHY CHEST WITH CONTRAST TECHNIQUE: Multidetector CT imaging of the chest was performed using the standard protocol during bolus  administration of intravenous contrast. Multiplanar CT image reconstructions and MIPs were obtained to evaluate the vascular anatomy. CONTRAST:  61mL OMNIPAQUE IOHEXOL 350 MG/ML SOLN COMPARISON:  01/21/2020 FINDINGS: Cardiovascular: This is a technically adequate evaluation of the pulmonary vasculature. There are bilateral segmental pulmonary emboli most pronounced within the left upper and right lower lobes. There is no evidence of right heart strain. No pericardial effusion.  The thoracic aorta is unremarkable. Mediastinum/Nodes: No enlarged mediastinal, hilar, or axillary lymph nodes. Thyroid gland, trachea, and esophagus demonstrate no significant findings. Lungs/Pleura: There is scattered hypoventilatory changes at the lung bases. No acute airspace disease, effusion, or pneumothorax. Central airways are patent. Upper Abdomen: Calcified gallstones are identified without  cholecystitis. Remainder of the upper abdomen is unremarkable. Musculoskeletal: No acute or destructive bony lesions. Reconstructed images demonstrate no additional findings. Review of the MIP images confirms the above findings. IMPRESSION: 1. Bilateral segmental pulmonary emboli, with no evidence of right heart strain. These results were called by telephone at the time of interpretation on 01/21/2020 at 7:08 pm to provider JOSHUA LONG , who verbally acknowledged these results. Electronically Signed   By: Sharlet Salina M.D.   On: 01/21/2020 19:10    EKG: Independently reviewed. Sinus tach  Assessment/Plan Active Problems:   Acute pulmonary embolism (HCC)   # Acute bilateral pulmonary embolism - hemodynamically stable, not hypoxic, no evidence right heart strain. No clear signs dvt on exam but both calves are tender. Appears to be unprovoked. Suspect possible hereditary component given mother w/ hx dvt and daughter w/ antiphospholipid syndrome. Trop mildly elevated, delta is neg, think acs unlikely. - cont heparin, likely convert to noac tomorrow - f/u LE dopplers - monitor on tele, continuous pulse o2 - outpatient routine cancer screening  # hypokalemia - mild 3.2, suspect 2/2 home thiazide - kcl 40 meq - mg  And k levels in AM  # htn - here bp elevated in setting of pe and no home meds - resume home vytorin, hctz, zestoretic  # gerd - pantop for home omeprazole  # osa - says not on cpap @ home  # dm - diet controlled  # overactive bladder - enablex for home vesicare  DVT prophylaxis: therapeutic heparin Code Status: full  Family Communication: daughter, updated telephonically   Disposition Plan: tbd   Consults called: none  Admission status: tele    Silvano Bilis MD [facility] Pager [phone]  If 7PM-7AM, please contact night-coverage [email or web address] Password [facility]  01/21/2020, 11:33 PM

## 2020-01-22 ENCOUNTER — Inpatient Hospital Stay (HOSPITAL_BASED_OUTPATIENT_CLINIC_OR_DEPARTMENT_OTHER): Payer: BC Managed Care – PPO

## 2020-01-22 DIAGNOSIS — R0602 Shortness of breath: Secondary | ICD-10-CM

## 2020-01-22 DIAGNOSIS — I2694 Multiple subsegmental pulmonary emboli without acute cor pulmonale: Secondary | ICD-10-CM | POA: Diagnosis not present

## 2020-01-22 DIAGNOSIS — R072 Precordial pain: Secondary | ICD-10-CM

## 2020-01-22 DIAGNOSIS — E119 Type 2 diabetes mellitus without complications: Secondary | ICD-10-CM | POA: Diagnosis not present

## 2020-01-22 DIAGNOSIS — Z20822 Contact with and (suspected) exposure to covid-19: Secondary | ICD-10-CM | POA: Diagnosis not present

## 2020-01-22 DIAGNOSIS — I2609 Other pulmonary embolism with acute cor pulmonale: Secondary | ICD-10-CM | POA: Diagnosis not present

## 2020-01-22 DIAGNOSIS — I2699 Other pulmonary embolism without acute cor pulmonale: Secondary | ICD-10-CM | POA: Diagnosis not present

## 2020-01-22 DIAGNOSIS — I10 Essential (primary) hypertension: Secondary | ICD-10-CM | POA: Diagnosis not present

## 2020-01-22 LAB — CBC
HCT: 30.9 % — ABNORMAL LOW (ref 36.0–46.0)
Hemoglobin: 10 g/dL — ABNORMAL LOW (ref 12.0–15.0)
MCH: 27.1 pg (ref 26.0–34.0)
MCHC: 32.4 g/dL (ref 30.0–36.0)
MCV: 83.7 fL (ref 80.0–100.0)
Platelets: 193 10*3/uL (ref 150–400)
RBC: 3.69 MIL/uL — ABNORMAL LOW (ref 3.87–5.11)
RDW: 13.5 % (ref 11.5–15.5)
WBC: 7 10*3/uL (ref 4.0–10.5)
nRBC: 0 % (ref 0.0–0.2)

## 2020-01-22 LAB — MAGNESIUM: Magnesium: 2.1 mg/dL (ref 1.7–2.4)

## 2020-01-22 LAB — HIV ANTIBODY (ROUTINE TESTING W REFLEX): HIV Screen 4th Generation wRfx: NONREACTIVE

## 2020-01-22 LAB — HEPARIN LEVEL (UNFRACTIONATED): Heparin Unfractionated: 0.71 IU/mL — ABNORMAL HIGH (ref 0.30–0.70)

## 2020-01-22 MED ORDER — APIXABAN (ELIQUIS) VTE STARTER PACK (10MG AND 5MG)
ORAL_TABLET | ORAL | 0 refills | Status: DC
Start: 2020-01-22 — End: 2020-03-21

## 2020-01-22 MED ORDER — HEPARIN (PORCINE) 25000 UT/250ML-% IV SOLN
1700.0000 [IU]/h | INTRAVENOUS | Status: AC
  Administered 2020-01-22: 1700 [IU]/h via INTRAVENOUS
  Filled 2020-01-22: qty 250

## 2020-01-22 MED ORDER — HEPARIN (PORCINE) 25000 UT/250ML-% IV SOLN: 1700.0000 [IU]/h | INTRAVENOUS | Status: DC

## 2020-01-22 MED ORDER — APIXABAN 5 MG PO TABS
10.0000 mg | ORAL_TABLET | Freq: Two times a day (BID) | ORAL | Status: DC
  Administered 2020-01-22: 10 mg via ORAL
  Filled 2020-01-22: qty 2

## 2020-01-22 MED ORDER — APIXABAN 5 MG PO TABS: 5.0000 mg | ORAL_TABLET | Freq: Two times a day (BID) | ORAL | Status: DC

## 2020-01-22 NOTE — Discharge Instructions (Signed)
Information on my medicine - ELIQUIS (apixaban)  Why was Eliquis prescribed for you? Eliquis was prescribed to treat blood clots that may have been found in the veins of your legs (deep vein thrombosis) or in your lungs (pulmonary embolism) and to reduce the risk of them occurring again.  What do You need to know about Eliquis ? The starting dose is 10 mg (two 5 mg tablets) taken TWICE daily for the FIRST SEVEN (7) DAYS, then on 01/29/2020, the dose is reduced to ONE 5 mg tablet taken TWICE daily.  Eliquis may be taken with or without food.   Try to take the dose about the same time in the morning and in the evening. If you have difficulty swallowing the tablet whole please discuss with your pharmacist how to take the medication safely.  Take Eliquis exactly as prescribed and DO NOT stop taking Eliquis without talking to the doctor who prescribed the medication.  Stopping may increase your risk of developing a new blood clot.  Refill your prescription before you run out.  After discharge, you should have regular check-up appointments with your healthcare provider that is prescribing your Eliquis.    What do you do if you miss a dose? If a dose of ELIQUIS is not taken at the scheduled time, take it as soon as possible on the same day and twice-daily administration should be resumed. The dose should not be doubled to make up for a missed dose.  Important Safety Information A possible side effect of Eliquis is bleeding. You should call your healthcare provider right away if you experience any of the following: ? Bleeding from an injury or your nose that does not stop. ? Unusual colored urine (red or dark brown) or unusual colored stools (red or black). ? Unusual bruising for unknown reasons. ? A serious fall or if you hit your head (even if there is no bleeding).  Some medicines may interact with Eliquis and might increase your risk of bleeding or clotting while on Eliquis. To help  avoid this, consult your healthcare provider or pharmacist prior to using any new prescription or non-prescription medications, including herbals, vitamins, non-steroidal anti-inflammatory drugs (NSAIDs) and supplements.  This website has more information on Eliquis (apixaban): http://[email or web address].com/eliquis/home

## 2020-01-22 NOTE — Progress Notes (Signed)
ANTICOAGULATION CONSULT NOTE - Initial Consult  Pharmacy Consult for heparin Indication: pulmonary embolus  Allergies  Allergen Reactions  . Penicillins     REACTION: hives    Patient Measurements: Height: 5\' 5"  (165.1 cm) Weight: (!) 151.9 kg (334 lb 12.8 oz) IBW/kg (Calculated) : 57  Heparin dosing weight: 99kg  Vital Signs: Temp: 98.5 F (36.9 C) (07/10 0227) Temp Source: Oral (07/10 0227) BP: 172/86 (07/10 0227) Pulse Rate: 78 (07/10 0227)  Labs: Recent Labs    01/21/20 1542 01/21/20 1741 01/22/20 0206  HGB 10.6*  --  10.0*  HCT 33.4*  --  30.9*  PLT 195  --  193  HEPARINUNFRC  --   --  0.71*  CREATININE 1.18*  --   --   TROPONINIHS 26* 24*  --     Estimated Creatinine Clearance: 74.1 mL/min (A) (by C-G formula based on SCr of 1.18 mg/dL (H)).   Medical History: Past Medical History:  Diagnosis Date  . Acid reflux   . Diabetes mellitus without complication (HCC)    Pt states she is prediabetic  . Hypertension   . OSA (obstructive sleep apnea)     Medications:  Medications Prior to Admission  Medication Sig Dispense Refill Last Dose  . ASCORBIC ACID PO Take 1 tablet by mouth daily.   01/20/2020 at Unknown time  . aspirin EC 81 MG tablet Take 81 mg by mouth daily. Swallow whole.   01/21/2020 at Unknown time  . cholecalciferol (VITAMIN D3) 25 MCG (1000 UT) tablet Take 1,000 Units by mouth daily.   01/20/2020 at Unknown time  . CYCLOBENZAPRINE HCL PO Take 1 tablet by mouth at bedtime as needed.   01/20/2020 at Unknown time  . diclofenac sodium (VOLTAREN) 1 % GEL Apply 2 g topically 4 (four) times daily. 100 g 2 Past Month at Unknown time  . ezetimibe-simvastatin (VYTORIN) 10-40 MG tablet Take 1 tablet by mouth once daily 90 tablet 0 01/20/2020 at Unknown time  . Fe Fum-FePoly-FA-Vit C-Vit B3 (INTEGRA F) 125-1 MG CAPS Take 1 tablet by mouth daily. 30 capsule 2 01/20/2020 at Unknown time  . hydrALAZINE (APRESOLINE) 25 MG tablet Take 25 mg by mouth in the morning and at  bedtime.   01/20/2020  . hydrochlorothiazide (HYDRODIURIL) 25 MG tablet Take 1 tablet by mouth once daily 90 tablet 0 01/20/2020 at Unknown time  . lisinopril-hydrochlorothiazide (ZESTORETIC) 20-25 MG tablet Take 1 tablet by mouth daily. 90 tablet 1 01/20/2020 at Unknown time  . methocarbamol (ROBAXIN) 750 MG tablet Take 750 mg by mouth 2 (two) times daily as needed for muscle spasms.   01/20/2020  . Multiple Vitamins-Minerals (MULTIVITAMIN PO) Take 1 tablet by mouth daily.   01/20/2020  . MYRBETRIQ 50 MG TB24 tablet Take 50 mg by mouth daily.   01/20/2020  . naproxen (NAPROSYN) 500 MG tablet Take 500 mg by mouth 2 (two) times daily as needed.   01/20/2020  . omeprazole (PRILOSEC) 40 MG capsule Take 40 mg by mouth daily.   01/21/2020 at Unknown time  . SAXENDA 18 MG/3ML SOPN Inject 3 mg into the skin daily.   Past Month at Unknown time  . solifenacin (VESICARE) 10 MG tablet Take 1 tablet by mouth once daily 30 tablet 0 01/20/2020  . Insulin Pen Needle (PEN NEEDLES) 32G X 4 MM MISC 1 each by Does not apply route daily. Use as directed daily with saxenda 100 each 3     Assessment: 81 YOF with new bilateral pulmonary emboli  to start IV heparin. H/H low, Plt wnl. [place] 1.18  - 0200 HL = 0.71 (slightly supratherapeutic) with heparin infusing @ 1800 units/hr - No complications of therapy noted  Goal of Therapy:  Heparin level 0.3-0.7 units/ml Monitor platelets by anticoagulation protocol: Yes   Plan:  -Decrease heparin infusion to 1700 units/hr  -F/u 6 hr HL  -Monitor daily HL, CBC and s/s of bleeding   Terrilee Files, PharmD 01/22/2020 @ [phone]

## 2020-01-22 NOTE — Progress Notes (Addendum)
Bilateral lower extremity venous duplex completed. Refer to "CV Proc" under chart review to view preliminary results.  Preliminary results discussed with Cala Bradford, RN and Dr. Elvera Lennox.  01/22/2020 2:58 PM Eula Fried., MHA, RVT, RDCS, RDMS

## 2020-01-22 NOTE — Discharge Summary (Signed)
Physician Discharge Summary  Joan LoreBernadette F Philipps WUJ:[id number] DOB: 09/11/1955 DOA: 01/21/2020  PCP: Arnette FeltsMoore, [name], FNP  Admit date: 01/21/2020 Discharge date: 01/22/2020  Admitted From: home Disposition:  home  Recommendations for Outpatient Follow-up:  1. Follow up with PCP in 1-2 weeks 2. Follow up with Dr. Bertis RuddyGorsuch in 1-2 weeks  Home Health: none Equipment/Devices: none  Discharge Condition: stable CODE STATUS: Full code Diet recommendation: regular  HPI: Per admitting MD, Joan [name] is a 64 y.o. female with medical history significant for morbid obesity, dm, htn, osa not on cpap, who presents with above. Symptoms began insidiously 5 days ago. Began as mild dyspnea on exertion that slowly worsened. None at rest. Fever today as well. Coughs some, no hemoptysis, when exerts self. Some chest pressure at those times as well. No new leg pain or swelling. No recent hospitalization or other immobilization. Mother has history of dvt, and daughter has antiphospholipid syndrome. No personal history dvt/pe. S/p hysterectomy, says overdue for colorectal cancer screening and breast cancer screening.  Hospital Course / Discharge diagnoses: Acute bilateral pulmonary embolism -patient was admitted to the hospital with chest pain and shortness of breath, she had an elevated D-dimer and ended up having a CT angiogram which showed bilateral segmental pulmonary emboli without evidence of right heart strain.  Patient was placed on heparin drip and was admitted to the hospital.  CT scan did not show any evidence of right heart strain, patient was not hypoxic nor hypotensive and clinically stable.  She did not have any bleeding overnight, and after discussing risk/benefit patient was transitioned to Eliquis.  I discussed with patient that due to her BMI there is incomplete data in terms of Eliquis efficiency versus Coumadin but she is very clear that she does not want to start Coumadin.  This appears to  be an unprovoked PE, however patient inquired whether it could be related to her second Covid vaccination which she had 10 days prior to this happening.  She has family history of DVTs, her mother had the first blood clot in her 940s and the patient's daughter was diagnosed with antiphospholipid syndrome and is also on anticoagulation.  She was referred to hematology.  She also underwent lower extremity Dopplers which showed an age-indeterminate DVT involving the left femoral vein.  There is no DVT on the right.  On the day of discharge, she is feeling much better, no longer has chest pain, she is able to ambulate in the hallway maintaining good oxygen saturations.  Essential hypertension-resume home medications OSA-not on CPAP at home Diabetes mellitus-diet controlled Elevated troponin-downtrending, flat, not in a pattern consistent with ACS.  Her chest pain is most likely due to bilateral PE Overactive bladder-continue home medications Morbid obesity-patient is interested in bariatric approach, she can see our outpatient clinic  Discharge Instructions   Allergies as of 01/22/2020      Reactions   Penicillins    REACTION: hives      Medication List    STOP taking these medications   aspirin EC 81 MG tablet   naproxen 500 MG tablet Commonly known as: NAPROSYN     TAKE these medications   Apixaban Starter Pack (10mg  and 5mg ) Commonly known as: ELIQUIS STARTER PACK Take as directed on package: start with two-5mg  tablets twice daily for 7 days. On day 8, switch to one-5mg  tablet twice daily.   ASCORBIC ACID PO Take 1 tablet by mouth daily.   cholecalciferol 25 MCG (1000 UNIT) tablet Commonly known  as: VITAMIN D3 Take 1,000 Units by mouth daily.   CYCLOBENZAPRINE HCL PO Take 1 tablet by mouth at bedtime as needed.   diclofenac sodium 1 % Gel Commonly known as: VOLTAREN Apply 2 g topically 4 (four) times daily.   ezetimibe-simvastatin 10-40 MG tablet Commonly known as:  VYTORIN Take 1 tablet by mouth once daily   hydrALAZINE 25 MG tablet Commonly known as: APRESOLINE Take 25 mg by mouth in the morning and at bedtime.   hydrochlorothiazide 25 MG tablet Commonly known as: HYDRODIURIL Take 1 tablet by mouth once daily   Integra F 125-1 MG Caps Take 1 tablet by mouth daily.   lisinopril-hydrochlorothiazide 20-25 MG tablet Commonly known as: ZESTORETIC Take 1 tablet by mouth daily.   methocarbamol 750 MG tablet Commonly known as: ROBAXIN Take 750 mg by mouth 2 (two) times daily as needed for muscle spasms.   MULTIVITAMIN PO Take 1 tablet by mouth daily.   Myrbetriq 50 MG Tb24 tablet Generic drug: mirabegron ER Take 50 mg by mouth daily.   omeprazole 40 MG capsule Commonly known as: PRILOSEC Take 40 mg by mouth daily.   Pen Needles 32G X 4 MM Misc 1 each by Does not apply route daily. Use as directed daily with saxenda   Saxenda 18 MG/3ML Sopn Generic drug: Liraglutide -Weight Management Inject 3 mg into the skin daily.   solifenacin 10 MG tablet Commonly known as: VESICARE Take 1 tablet by mouth once daily       Follow-up Information    Artis Delay, MD. Call in 1 week(s).   Specialty: Hematology and Oncology Why: to establish care Contact information: [address] [place] [address] [phone]        [name], FNP Follow up.   Specialty: General Practice Contact information: [address] [place] [address] [phone]               Consultations:  None  Procedures/Studies:  DG Chest 2 View  Result Date: 01/21/2020 CLINICAL DATA:  Chest pain.  Shortness of breath. EXAM: CHEST - 2 VIEW COMPARISON:  September 15, 2009 FINDINGS: The heart size is mildly enlarged. The hila and mediastinum are normal. Platelike opacity in left base was not seen previously. No pneumothorax. No nodules or masses. No other infiltrates identified. IMPRESSION: Streaky opacity in left base is  probably atelectasis or scar. No other acute abnormalities. Electronically Signed   By: Gerome Sam III M.D   On: 01/21/2020 16:27   CT Angio Chest PE W and/or Wo Contrast  Result Date: 01/21/2020 CLINICAL DATA:  Elevated D-dimer, epigastric pain for 6 hours, shortness of breath EXAM: CT ANGIOGRAPHY CHEST WITH CONTRAST TECHNIQUE: Multidetector CT imaging of the chest was performed using the standard protocol during bolus administration of intravenous contrast. Multiplanar CT image reconstructions and MIPs were obtained to evaluate the vascular anatomy. CONTRAST:  85mL OMNIPAQUE IOHEXOL 350 MG/ML SOLN COMPARISON:  01/21/2020 FINDINGS: Cardiovascular: This is a technically adequate evaluation of the pulmonary vasculature. There are bilateral segmental pulmonary emboli most pronounced within the left upper and right lower lobes. There is no evidence of right heart strain. No pericardial effusion.  The thoracic aorta is unremarkable. Mediastinum/Nodes: No enlarged mediastinal, hilar, or axillary lymph nodes. Thyroid gland, trachea, and esophagus demonstrate no significant findings. Lungs/Pleura: There is scattered hypoventilatory changes at the lung bases. No acute airspace disease, effusion, or pneumothorax. Central airways are patent. Upper Abdomen: Calcified gallstones are identified without cholecystitis. Remainder of the upper  abdomen is unremarkable. Musculoskeletal: No acute or destructive bony lesions. Reconstructed images demonstrate no additional findings. Review of the MIP images confirms the above findings. IMPRESSION: 1. Bilateral segmental pulmonary emboli, with no evidence of right heart strain. These results were called by telephone at the time of interpretation on 01/21/2020 at 7:08 pm to provider JOSHUA LONG , who verbally acknowledged these results. Electronically Signed   By: Sharlet Salina M.D.   On: 01/21/2020 19:10   VAS [place] LOWER EXTREMITY VENOUS (DVT)  Result Date: 01/22/2020  Lower  Venous DVTStudy Indications: Pulmonary embolism.  Limitations: Body habitus and poor ultrasound/tissue interface. Comparison Study: No prior study Performing Technologist: Gertie Fey MHA, RDMS, RVT, RDCS  Examination Guidelines: A complete evaluation includes B-mode imaging, spectral Doppler, color Doppler, and power Doppler as needed of all accessible portions of each vessel. Bilateral testing is considered an integral part of a complete examination. Limited examinations for reoccurring indications may be performed as noted. The reflux portion of the exam is performed with the patient in reverse Trendelenburg.  +---------+---------------+---------+-----------+----------+--------------+ RIGHT    CompressibilityPhasicitySpontaneityPropertiesThrombus Aging +---------+---------------+---------+-----------+----------+--------------+ CFV      Full           Yes      Yes                                 +---------+---------------+---------+-----------+----------+--------------+ SFJ      Full                                                        +---------+---------------+---------+-----------+----------+--------------+ FV Prox  Full                                                        +---------+---------------+---------+-----------+----------+--------------+ FV Mid   Full                                                        +---------+---------------+---------+-----------+----------+--------------+ FV DistalFull                                                        +---------+---------------+---------+-----------+----------+--------------+ PFV      Full                                                        +---------+---------------+---------+-----------+----------+--------------+ POP      Full           Yes      Yes                                 +---------+---------------+---------+-----------+----------+--------------+  PTV      Full                                                         +---------+---------------+---------+-----------+----------+--------------+ PERO     Full                                                        +---------+---------------+---------+-----------+----------+--------------+   +---------+---------------+---------+-----------+----------+-----------------+ LEFT     CompressibilityPhasicitySpontaneityPropertiesThrombus Aging    +---------+---------------+---------+-----------+----------+-----------------+ CFV      Full           Yes      Yes                                    +---------+---------------+---------+-----------+----------+-----------------+ SFJ      Full                                                           +---------+---------------+---------+-----------+----------+-----------------+ FV Prox  Full                                                           +---------+---------------+---------+-----------+----------+-----------------+ FV Mid   Partial                 No                   Age Indeterminate +---------+---------------+---------+-----------+----------+-----------------+ FV DistalFull                                                           +---------+---------------+---------+-----------+----------+-----------------+ PFV      Full                                                           +---------+---------------+---------+-----------+----------+-----------------+ POP      Full           Yes      Yes                                    +---------+---------------+---------+-----------+----------+-----------------+ PTV      Full                                                           +---------+---------------+---------+-----------+----------+-----------------+  PERO     Full                                                           +---------+---------------+---------+-----------+----------+-----------------+     Summary:  RIGHT: - There is no evidence of deep vein thrombosis in the lower extremity.  - No cystic structure found in the popliteal fossa.  LEFT: - Findings consistent with age indeterminate deep vein thrombosis involving the left femoral vein. - No cystic structure found in the popliteal fossa.  *See table(s) above for measurements and observations.    Preliminary      Subjective: - no chest pain, shortness of breath, no abdominal pain, nausea or vomiting.   Discharge Exam: BP (!) 152/94 (BP Location: Left Arm)   Pulse 81   Temp 99.3 F (37.4 C) (Oral)   Resp 19   Ht 5\' 5"  (1.651 m)   Wt (!) 151.9 kg   SpO2 95%   BMI 55.71 kg/m   General: Pt is alert, awake, not in acute distress Cardiovascular: RRR, S1/S2 +, no rubs, no gallops Respiratory: CTA bilaterally, no wheezing, no rhonchi Abdominal: Soft, NT, ND, bowel sounds + Extremities: no edema, no cyanosis  The results of significant diagnostics from this hospitalization (including imaging, microbiology, ancillary and laboratory) are listed below for reference.     Microbiology: Recent Results (from the past 240 hour(s))  SARS Coronavirus 2 by RT PCR (hospital order, performed in [facility] hospital lab) Nasopharyngeal Nasopharyngeal Swab     Status: None   Collection Time: 01/21/20  4:19 PM   Specimen: Nasopharyngeal Swab  Result Value Ref Range Status   SARS Coronavirus 2 NEGATIVE NEGATIVE Final    Comment: (NOTE) SARS-CoV-2 target nucleic acids are NOT DETECTED.  The SARS-CoV-2 RNA is generally detectable in upper and lower respiratory specimens during the acute phase of infection. The lowest concentration of SARS-CoV-2 viral copies this assay can detect is 250 copies / mL. A negative result does not preclude SARS-CoV-2 infection and should not be used as the sole basis for treatment or other patient management decisions.  A negative result may occur with improper specimen collection / handling, submission of specimen  other than nasopharyngeal swab, presence of viral mutation(s) within the areas targeted by this assay, and inadequate number of viral copies (<250 copies / mL). A negative result must be combined with clinical observations, patient history, and epidemiological information.  Fact Sheet for Patients:   03/23/20  Fact Sheet for Healthcare Providers: BoilerBrush.com.cy  This test is not yet approved or  cleared by the https://pope.com/ FDA and has been authorized for detection and/or diagnosis of SARS-CoV-2 by FDA under an Emergency Use Authorization (EUA).  This EUA will remain in effect (meaning this test can be used) for the duration of the COVID-19 declaration under Section 564(b)(1) of the Act, 21 U.S.C. section 360bbb-3(b)(1), unless the authorization is terminated or revoked sooner.  Performed at [facility], [address] [place], [place]      Labs: Basic Metabolic Panel: Recent Labs  Lab 01/21/20 1542 01/22/20 0206  NA 141  --   K 3.2*  --   CL 104  --   CO2 25  --   GLUCOSE 111*  --   BUN 23  --  CREATININE 1.18*  --   CALCIUM 8.9  --   MG  --  2.1   Liver Function Tests: No results for input(s): AST, ALT, ALKPHOS, BILITOT, PROT, ALBUMIN in the last 168 hours. CBC: Recent Labs  Lab 01/21/20 1542 01/22/20 0206  WBC 7.1 7.0  HGB 10.6* 10.0*  HCT 33.4* 30.9*  MCV 83.3 83.7  PLT 195 193   CBG: No results for input(s): GLUCAP in the last 168 hours. Hgb A1c No results for input(s): HGBA1C in the last 72 hours. Lipid Profile No results for input(s): CHOL, HDL, LDLCALC, TRIG, CHOLHDL, LDLDIRECT in the last 72 hours. Thyroid function studies No results for input(s): TSH, T4TOTAL, T3FREE, THYROIDAB in the last 72 hours.  Invalid input(s): FREET3 Urinalysis    Component Value Date/Time   BILIRUBINUR negative 06/22/2019 1727   PROTEINUR Negative 06/22/2019 1727   UROBILINOGEN  0.2 06/22/2019 1727   NITRITE negative 06/22/2019 1727   LEUKOCYTESUR Negative 06/22/2019 1727    FURTHER DISCHARGE INSTRUCTIONS:   Get Medicines reviewed and adjusted: Please take all your medications with you for your next visit with your Primary MD   Laboratory/radiological data: Please request your Primary MD to go over all hospital tests and procedure/radiological results at the follow up, please ask your Primary MD to get all Hospital records sent to his/her office.   In some cases, they will be blood work, cultures and biopsy results pending at the time of your discharge. Please request that your primary care M.D. goes through all the records of your hospital data and follows up on these results.   Also Note the following: If you experience worsening of your admission symptoms, develop shortness of breath, life threatening emergency, suicidal or homicidal thoughts you must seek medical attention immediately by calling 911 or calling your MD immediately  if symptoms less severe.   You must read complete instructions/literature along with all the possible adverse reactions/side effects for all the Medicines you take and that have been prescribed to you. Take any new Medicines after you have completely understood and accpet all the possible adverse reactions/side effects.    Do not drive when taking Pain medications or sleeping medications (Benzodaizepines)   Do not take more than prescribed Pain, Sleep and Anxiety Medications. It is not advisable to combine anxiety,sleep and pain medications without talking with your primary care practitioner   Special Instructions: If you have smoked or chewed Tobacco  in the last 2 yrs please stop smoking, stop any regular Alcohol  and or any Recreational drug use.   Wear Seat belts while driving.   Please note: You were cared for by a hospitalist during your hospital stay. Once you are discharged, your primary care physician will handle any  further medical issues. Please note that NO REFILLS for any discharge medications will be authorized once you are discharged, as it is imperative that you return to your primary care physician (or establish a relationship with a primary care physician if you do not have one) for your post hospital discharge needs so that they can reassess your need for medications and monitor your lab values.  Time coordinating discharge: 45 minutes  SIGNED:  Pamella Pert, MD, PhD 01/22/2020, 3:42 PM

## 2020-01-22 NOTE — Progress Notes (Signed)
PT Cancellation Note  Patient Details Name: Joan [name] MRN: [id number] DOB: 1956-03-31   Cancelled Treatment:    Reason Eval/Treat Not Completed: Medical issues which prohibited therapy (noted pt has acute PE, dopplers pending for BLEs, per rehab protocol we will wait 24* after initiation of heparin before initiating mobility. Heparin started at 19:41 01/21/20. Will follow.)  Tamala Ser PT 01/22/2020  Acute Rehabilitation Services Pager [phone] Office [phone]

## 2020-01-22 NOTE — Progress Notes (Signed)
ANTICOAGULATION CONSULT NOTE  Pharmacy Consult for IV heparin --> PO Apixaban  Indication: pulmonary embolus  Allergies  Allergen Reactions  . Penicillins     REACTION: hives    Patient Measurements: Height: 5\' 5"  (165.1 cm) Weight: (!) 151.9 kg (334 lb 12.8 oz) IBW/kg (Calculated) : 57  Heparin dosing weight: 95.4kg  Vital Signs: Temp: 100.2 F (37.9 C) (07/10 0615) Temp Source: Oral (07/10 0615) BP: 140/72 (07/10 0615) Pulse Rate: 93 (07/10 0615)  Labs: Recent Labs    01/21/20 1542 01/21/20 1741 01/22/20 0206  HGB 10.6*  --  10.0*  HCT 33.4*  --  30.9*  PLT 195  --  193  HEPARINUNFRC  --   --  0.71*  CREATININE 1.18*  --   --   TROPONINIHS 26* 24*  --     Estimated Creatinine Clearance: 74.1 mL/min (A) (by C-G formula based on SCr of 1.18 mg/dL (H)).   Medical History: Past Medical History:  Diagnosis Date  . Acid reflux   . Diabetes mellitus without complication (HCC)    Pt states she is prediabetic  . Hypertension   . OSA (obstructive sleep apnea)     Assessment: 70 y/oF with new bilateral pulmonary emboli with no evidence of right heart strain. Pharmacy initially consulted to start IV heparin. Now asked to transition to PO Apixaban.   - SCr 1.18 (on 7/9)  - CBC: Hgb 10, stable. Pltc WNL - No bleeding issues reported per nursing  Plan:  -Discussed with MD risk vs.benefit of using warfarin vs. DOAC. Per MD, patient refusing warfarin and requests Apixaban.  -Stop heparin infusion. -At same time, start Apixaban 10mg  PO BID x 7 days, then 5mg  PO BID.  -Monitor CBC and for s/sx of bleeding. -Pharmacy to provide 30-day free coupon and education prior to discharge.   9/9, PharmD, BCPS Clinical Pharmacist  01/22/2020 9:20 AM

## 2020-01-25 NOTE — Progress Notes (Signed)
This visit occurred during the SARS-CoV-2 public health emergency.  Safety protocols were in place, including screening questions prior to the visit, additional usage of staff PPE, and extensive cleaning of exam room while observing appropriate contact time as indicated for disinfecting solutions.  Subjective:     Patient ID: Joan [name] , female    DOB: 04-19-1956 , 64 y.o.   MRN: [id number]   Chief Complaint  Patient presents with  . Hospitalization Follow-up    HPI  Here today for her routine office visit but has informed us she was admitted to the hospital on 7/9 -7/10.  She had shortness of breath and chest pain. She was found to have bilateral PE and left lower extremity DVT.  She had began her symptoms 5 days prior to going to the hospital   Since being home she is tired. She is not having as much difficulty breathing. She is currently out of work for her right shoulder _torn rotator cuff.  In which she was going to PT two times a week.    She has not made an appt with Dr. Elson Areas.  She was sent to PT by Dr. Latanya Maudlin and needs clearance to go back to PT. Wt Readings from Last 3 Encounters: 01/26/20 : (!) 331 lb 12.8 oz (150.5 kg) 01/21/20 : (!) 334 lb 12.8 oz (151.9 kg) 09/21/19 : (!) 335 lb 3.2 oz (152 kg)  Hyperlipidemia - tolerate medication well no complaints of muscle aches   Hypertension Pertinent negatives include no headaches or shortness of breath.     Past Medical History:  Diagnosis Date  . Acid reflux   . Diabetes mellitus without complication ([place])    Pt states she is prediabetic  . Hypertension   . OSA (obstructive sleep apnea)      Family History  Problem Relation Age of Onset  . Heart disease Mother   . Heart disease Father   . Prostate cancer Father      Current Outpatient Medications:  .  cholecalciferol (VITAMIN D3) 25 MCG (1000 UT) tablet, Take 1,000 Units by mouth daily., Disp: , Rfl:  .  CYCLOBENZAPRINE HCL PO, Take 1 tablet by mouth  at bedtime as needed., Disp: , Rfl:  .  diclofenac sodium (VOLTAREN) 1 % GEL, Apply 2 g topically 4 (four) times daily., Disp: 100 g, Rfl: 2 .  Fe Fum-FePoly-FA-Vit C-Vit B3 (INTEGRA F) 125-1 MG CAPS, Take 1 tablet by mouth daily., Disp: 30 capsule, Rfl: 2 .  Insulin Pen Needle (PEN NEEDLES) 32G X 4 MM MISC, 1 each by Does not apply route daily. Use as directed daily with saxenda, Disp: 100 each, Rfl: 3 .  methocarbamol (ROBAXIN) 750 MG tablet, Take 750 mg by mouth 2 (two) times daily as needed for muscle spasms., Disp: , Rfl:  .  Multiple Vitamins-Minerals (MULTIVITAMIN PO), Take 1 tablet by mouth daily., Disp: , Rfl:  .  MYRBETRIQ 50 MG TB24 tablet, Take 50 mg by mouth daily., Disp: , Rfl:  .  omeprazole (PRILOSEC) 40 MG capsule, Take 40 mg by mouth daily., Disp: , Rfl:  .  solifenacin (VESICARE) 10 MG tablet, Take 1 tablet by mouth once daily, Disp: 30 tablet, Rfl: 0 .  albuterol (VENTOLIN HFA) 108 (90 Base) MCG/ACT inhaler, Inhale 2 puffs into the lungs every 6 (six) hours as needed for wheezing or shortness of breath., Disp: 18 g, Rfl: 1 .  apixaban (ELIQUIS) 5 MG TABS tablet, Take 1 tablet (5 mg total) by  mouth 2 (two) times daily., Disp: 60 tablet, Rfl: 11 .  ASCORBIC ACID PO, Take 1 tablet by mouth daily. (Patient not taking: No sig reported), Disp: , Rfl:  .  ezetimibe-simvastatin (VYTORIN) 10-40 MG tablet, Take 1 tablet by mouth daily., Disp: 90 tablet, Rfl: 1 .  hydrochlorothiazide (HYDRODIURIL) 25 MG tablet, Take 1 tablet by mouth once daily, Disp: 90 tablet, Rfl: 0 .  Liraglutide -Weight Management (SAXENDA) 18 MG/3ML SOPN, Inject 3 mg into the skin daily., Disp: 15 mL, Rfl: 0 .  lisinopril-hydrochlorothiazide (ZESTORETIC) 20-25 MG tablet, Take 1 tablet by mouth once daily, Disp: 90 tablet, Rfl: 0 .  nitrofurantoin, macrocrystal-monohydrate, (MACROBID) 100 MG capsule, Take 1 capsule (100 mg total) by mouth 2 (two) times daily for 5 days., Disp: 10 capsule, Rfl: 0 .  Vitamin D,  Ergocalciferol, (DRISDOL) 1.25 MG (50000 UNIT) CAPS capsule, Take 1 capsule (50,000 Units total) by mouth every 7 (seven) days., Disp: 12 capsule, Rfl: 0   Allergies  Allergen Reactions  . Penicillins     REACTION: hives     Review of Systems  Constitutional: Negative.  Negative for fatigue.  HENT: Negative.   Eyes: Negative.   Respiratory: Positive for cough (Dry). Negative for shortness of breath and wheezing.   Cardiovascular: Positive for leg swelling (Bilateral ankles ).  Endocrine: Negative for polydipsia, polyphagia and polyuria.  Skin: Negative.   Neurological: Negative for dizziness and headaches.  Psychiatric/Behavioral: Negative.      Today's Vitals   01/26/20 1600  BP: 132/90  Pulse: 93  Temp: 98.8 F (37.1 C)  TempSrc: Oral  Weight: (!) 331 lb 12.8 oz (150.5 kg)  Height: 5' 5"  (1.651 m)  PainSc: 0-No pain   Body mass index is 55.21 kg/m.   Objective:  Physical Exam Vitals reviewed.  Constitutional:      General: She is not in acute distress.    Appearance: Normal appearance. She is obese.  HENT:     Head: Normocephalic.  Cardiovascular:     Rate and Rhythm: Normal rate and regular rhythm.     Pulses: Normal pulses.     Heart sounds: Normal heart sounds. No murmur heard.   Pulmonary:     Effort: Pulmonary effort is normal. No respiratory distress.     Breath sounds: Normal breath sounds. No wheezing.     Comments: Dry hacking intermitted cough Skin:    General: Skin is warm and dry.     Capillary Refill: Capillary refill takes less than 2 seconds.  Neurological:     General: No focal deficit present.     Mental Status: She is alert and oriented to person, place, and time.     Cranial Nerves: No cranial nerve deficit.  Psychiatric:        Mood and Affect: Mood normal.        Behavior: Behavior normal.        Thought Content: Thought content normal.        Judgment: Judgment normal.         Assessment And Plan:     1. Acute pulmonary  embolism, unspecified pulmonary embolism type, unspecified whether acute cor pulmonale present [facility]) Here for hospital follow up after having being diagnosed with DVT this occurred a few weeks after having her covid vaccine. TCM Performed. A member of the clinical team spoke with the patient upon dischare. Discharge summary was reviewed in full detail during the visit. Meds reconciled and compared to discharge meds. Medication list is  updated and reviewed with the patient.  Greater than 50% face to face time was spent in counseling an coordination of care.  All questions were answered to the satisfaction of the patient.   Will refer to hematology as her daughter has a history of a blood disorder  2. DVT of deep femoral vein, left (HCC)  Now on eliquis and tolerating well  3. Mixed hyperlipidemia  Chronic, controlled  Continue with current medications - Lipid panel - BMP8+eGFR  4. Essential hypertension Chronic, blood pressure is fairly controlled Continue with current mediations - BMP8+eGFR  5. Class 3 severe obesity without serious comorbidity with body mass index (BMI) of 50.0 to 59.9 in adult, unspecified obesity type (HCC)  Chronic  Discussed healthy diet and regular exercise options   Encouraged to exercise at least 150 minutes per week with 2 days of strength training as tolerated recently diagnosed with DVT  6. Cough  No wheezing on physical exam but requesting albuterol inhaler - albuterol (VENTOLIN HFA) 108 (90 Base) MCG/ACT inhaler; Inhale 2 puffs into the lungs every 6 (six) hours as needed for wheezing or shortness of breath.  Dispense: 18 g; Refill: 1  patient was given opportunity to ask questions. Patient verbalized understanding of the plan and was able to repeat key elements of the plan. All questions were answered to their satisfaction.  Minette Brine, FNP    I, Minette Brine, FNP, have reviewed all documentation for this visit. The documentation on 07/09/20 for  the exam, diagnosis, procedures, and orders are all accurate and complete.   THE PATIENT IS ENCOURAGED TO PRACTICE SOCIAL DISTANCING DUE TO THE COVID-19 PANDEMIC.

## 2020-01-26 ENCOUNTER — Encounter: Payer: Self-pay | Admitting: Nurse Practitioner

## 2020-01-26 ENCOUNTER — Ambulatory Visit: Payer: BC Managed Care – PPO | Admitting: Nurse Practitioner

## 2020-01-26 ENCOUNTER — Other Ambulatory Visit: Payer: Self-pay

## 2020-01-26 VITALS — BP 132/90 | HR 93 | Temp 98.8°F | Ht 65.0 in | Wt 331.8 lb

## 2020-01-26 DIAGNOSIS — I10 Essential (primary) hypertension: Secondary | ICD-10-CM

## 2020-01-26 DIAGNOSIS — I82412 Acute embolism and thrombosis of left femoral vein: Secondary | ICD-10-CM | POA: Diagnosis not present

## 2020-01-26 DIAGNOSIS — E782 Mixed hyperlipidemia: Secondary | ICD-10-CM

## 2020-01-26 DIAGNOSIS — R05 Cough: Secondary | ICD-10-CM

## 2020-01-26 DIAGNOSIS — R059 Cough, unspecified: Secondary | ICD-10-CM

## 2020-01-26 DIAGNOSIS — E66813 Obesity, class 3: Secondary | ICD-10-CM

## 2020-01-26 DIAGNOSIS — Z6841 Body Mass Index (BMI) 40.0 and over, adult: Secondary | ICD-10-CM

## 2020-01-26 DIAGNOSIS — I2699 Other pulmonary embolism without acute cor pulmonale: Secondary | ICD-10-CM | POA: Diagnosis not present

## 2020-01-26 HISTORY — DX: Obesity, class 3: E66.813

## 2020-01-26 HISTORY — DX: Acute embolism and thrombosis of left femoral vein: I82.412

## 2020-01-26 HISTORY — DX: Cough, unspecified: R05.9

## 2020-01-26 MED ORDER — ALBUTEROL SULFATE HFA 108 (90 BASE) MCG/ACT IN AERS: 2.0000 | INHALATION_SPRAY | Freq: Four times a day (QID) | RESPIRATORY_TRACT | 1 refills | Status: DC | PRN

## 2020-01-26 NOTE — Patient Instructions (Signed)
Deep Vein Thrombosis  Deep vein thrombosis (DVT) is a condition in which a blood clot forms in a deep vein, such as a lower leg, thigh, or arm vein. A clot is blood that has thickened into a gel or solid. This condition is dangerous. It can lead to serious and even life-threatening complications if the clot travels to the lungs and causes a blockage (pulmonary embolism). It can also damage veins in the leg. This can result in leg pain, swelling, discoloration, and sores (post-thrombotic syndrome). What are the causes? This condition may be caused by:  A slowdown of blood flow.  Damage to a vein.  A condition that causes blood to clot more easily, such as an inherited clotting disorder. What increases the risk? The following factors may make you more likely to develop this condition:  Being overweight.  Being older, especially over age 60.  Sitting or lying down for more than four hours.  Being in the hospital.  Lack of physical activity (sedentary lifestyle).  Pregnancy, being in childbirth, or having recently given birth.  Taking medicines that contain estrogen, such as medicines to prevent pregnancy.  Smoking.  A history of any of the following: ? Blood clots or a blood clotting disease. ? Peripheral vascular disease. ? Inflammatory bowel disease. ? Cancer. ? Heart disease. ? Genetic conditions that affect how your blood clots, such as Factor V Leiden mutation. ? Neurological diseases that affect your legs (leg paresis). ? A recent injury, such as a car accident. ? Major or lengthy surgery. ? A central line placed inside a large vein. What are the signs or symptoms? Symptoms of this condition include:  Swelling, pain, or tenderness in an arm or leg.  Warmth, redness, or discoloration in an arm or leg. If the clot is in your leg, symptoms may be more noticeable or worse when you stand or walk. Some people may not develop any symptoms. How is this diagnosed? This  condition is diagnosed with:  A medical history and physical exam.  Tests, such as: ? Blood tests. These are done to check how well your blood clots. ? Ultrasound. This is done to check for clots. ? Venogram. For this test, contrast dye is injected into a vein and X-rays are taken to check for any clots. How is this treated? Treatment for this condition depends on:  The cause of your DVT.  Your risk for bleeding or developing more clots.  Any other medical conditions that you have. Treatment may include:  Taking a blood thinner (anticoagulant). This type of medicine prevents clots from forming. It may be taken by mouth, injected under the skin, or injected through an IV (catheter).  Injecting clot-dissolving medicines into the affected vein (catheter-directed thrombolysis).  Having surgery. Surgery may be done to: ? Remove the clot. ? Place a filter in a large vein to catch blood clots before they reach the lungs. Some treatments may be continued for up to six months. Follow these instructions at home: If you are taking blood thinners:  Take the medicine exactly as told by your health care provider. Some blood thinners need to be taken at the same time every day. Do not skip a dose.  Talk with your health care provider before you take any medicines that contain aspirin or NSAIDs. These medicines increase your risk for dangerous bleeding.  Ask your health care provider about foods and drugs that could change the way the medicine works (may interact). Avoid those things if your   health care provider tells you to do so.  Blood thinners can cause easy bruising and may make it difficult to stop bleeding. Because of this: ? Be very careful when using knives, scissors, or other sharp objects. ? Use an electric razor instead of a blade. ? Avoid activities that could cause injury or bruising, and follow instructions about how to prevent falls.  Wear a medical alert bracelet or carry a  card that lists what medicines you take. General instructions  Take over-the-counter and prescription medicines only as told by your health care provider.  Return to your normal activities as told by your health care provider. Ask your health care provider what activities are safe for you.  Wear compression stockings if recommended by your health care provider.  Keep all follow-up visits as told by your health care provider. This is important. How is this prevented? To lower your risk of developing this condition again:  For 30 or more minutes every day, do an activity that: ? Involves moving your arms and legs. ? Increases your heart rate.  When traveling for longer than four hours: ? Exercise your arms and legs every hour. ? Drink plenty of water. ? Avoid drinking alcohol.  Avoid sitting or lying for a long time without moving your legs.  If you have surgery or you are hospitalized, ask about ways to prevent blood clots. These may include taking frequent walks or using anticoagulants.  Stay at a healthy weight.  If you are a woman who is older than age 57, avoid unnecessary use of medicines that contain estrogen, such as some birth control pills.  Do not use any products that contain nicotine or tobacco, such as cigarettes and e-cigarettes. This is especially important if you take estrogen medicines. If you need help quitting, ask your health care provider. Contact a health care provider if:  You miss a dose of your blood thinner.  Your menstrual period is heavier than usual.  You have unusual bruising. Get help right away if:  You have: ? New or increased pain, swelling, or redness in an arm or leg. ? Numbness or tingling in an arm or leg. ? Shortness of breath. ? Chest pain. ? A rapid or irregular heartbeat. ? A severe headache or confusion. ? A cut that will not stop bleeding.  There is blood in your vomit, stool, or urine.  You have a serious fall or accident,  or you hit your head.  You feel light-headed or dizzy.  You cough up blood. These symptoms may represent a serious problem that is an emergency. Do not wait to see if the symptoms will go away. Get medical help right away. Call your local emergency services (911 in the [place] Do not drive yourself to the hospital. Summary  Deep vein thrombosis (DVT) is a condition in which a blood clot forms in a deep vein, such as a lower leg, thigh, or arm vein.  Symptoms can include swelling, warmth, pain, and redness in your leg or arm.  This condition may be treated with a blood thinner (anticoagulant medicine), medicine that is injected to dissolve blood clots,compression stockings, or surgery.  If you are prescribed blood thinners, take them exactly as told. This information is not intended to replace advice given to you by your health care provider. Make sure you discuss any questions you have with your health care provider. Document Revised: 06/13/2017 Document Reviewed: 11/29/2016 Elsevier Patient Education  [place]. Pulmonary Embolism  A  pulmonary embolism (PE) is a sudden blockage or decrease of blood flow in one or both lungs. Most blockages come from a blood clot that forms in the vein of a lower leg, thigh, or arm (deep vein thrombosis, DVT) and travels to the lungs. A clot is blood that has thickened into a gel or solid. PE is a dangerous and life-threatening condition that needs to be treated right away. What are the causes? This condition is usually caused by a blood clot that forms in a vein and moves to the lungs. In rare cases, it may be caused by air, fat, part of a tumor, or other tissue that moves through the veins and into the lungs. What increases the risk? The following factors may make you more likely to develop this condition:  Experiencing a traumatic injury, such as breaking a hip or leg.  Having: ? A spinal cord injury. ? Orthopedic surgery, especially hip or  knee replacement. ? Any major surgery. ? A stroke. ? DVT. ? Blood clots or blood clotting disease. ? Long-term (chronic) lung or heart disease. ? Cancer treated with chemotherapy. ? A central venous catheter.  Taking medicines that contain estrogen. These include birth control pills and hormone replacement therapy.  Being: ? Pregnant. ? In the period of time after your baby is delivered (postpartum). ? Older than age 32. ? Overweight. ? A smoker, especially if you have other risks. What are the signs or symptoms? Symptoms of this condition usually start suddenly and include:  Shortness of breath during activity or at rest.  Coughing, coughing up blood, or coughing up blood-tinged mucus.  Chest pain that is often worse with deep breaths.  Rapid or irregular heartbeat.  Feeling light-headed or dizzy.  Fainting.  Feeling anxious.  Fever.  Sweating.  Pain and swelling in a leg. This is a symptom of DVT, which can lead to PE. How is this diagnosed? This condition may be diagnosed based on:  Your medical history.  A physical exam.  Blood tests.  CT pulmonary angiogram. This test checks blood flow in and around your lungs.  Ventilation-perfusion scan, also called a lung VQ scan. This test measures air flow and blood flow to the lungs.  An ultrasound of the legs. How is this treated? Treatment for this condition depends on many factors, such as the cause of your PE, your risk for bleeding or developing more clots, and other medical conditions you have. Treatment aims to remove, dissolve, or stop blood clots from forming or growing larger. Treatment may include:  Medicines, such as: ? Blood thinning medicines (anticoagulants) to stop clots from forming. ? Medicines that dissolve clots (thrombolytics).  Procedures, such as: ? Using a flexible tube to remove a blood clot (embolectomy) or to deliver medicine to destroy it (catheter-directed thrombolysis). ? Inserting  a filter into a large vein that carries blood to the heart (inferior vena cava). This filter (vena cava filter) catches blood clots before they reach the lungs. ? Surgery to remove the clot (surgical embolectomy). This is rare. You may need a combination of immediate, long-term (up to 3 months after diagnosis), and extended (more than 3 months after diagnosis) treatments. Your treatment may continue for several months (maintenance therapy). You and your health care provider will work together to choose the treatment program that is best for you. Follow these instructions at home: Medicines  Take over-the-counter and prescription medicines only as told by your health care provider.  If you are taking an  anticoagulant medicine: ? Take the medicine every day at the same time each day. ? Understand what foods and drugs interact with your medicine. ? Understand the side effects of this medicine, including excessive bruising or bleeding. Ask your health care provider or pharmacist about other side effects. General instructions  Wear a medical alert bracelet or carry a medical alert card that says you have had a PE and lists what medicines you take.  Ask your health care provider when you may return to your normal activities. Avoid sitting or lying for a long time without moving.  Maintain a healthy weight. Ask your health care provider what weight is healthy for you.  Do not use any products that contain nicotine or tobacco, such as cigarettes, e-cigarettes, and chewing tobacco. If you need help quitting, ask your health care provider.  Talk with your health care provider about any travel plans. It is important to make sure that you are still able to take your medicine while on trips.  Keep all follow-up visits as told by your health care provider. This is important. Contact a health care provider if:  You missed a dose of your blood thinner medicine. Get help right away if:  You have: ? New  or increased pain, swelling, warmth, or redness in an arm or leg. ? Numbness or tingling in an arm or leg. ? Shortness of breath during activity or at rest. ? A fever. ? Chest pain. ? A rapid or irregular heartbeat. ? A severe headache. ? Vision changes. ? A serious fall or accident, or you hit your head. ? Stomach (abdominal) pain. ? Blood in your vomit, stool, or urine. ? A cut that will not stop bleeding.  You cough up blood.  You feel light-headed or dizzy.  You cannot move your arms or legs.  You are confused or have memory loss. These symptoms may represent a serious problem that is an emergency. Do not wait to see if the symptoms will go away. Get medical help right away. Call your local emergency services (911 in the [place] Do not drive yourself to the hospital. Summary  A pulmonary embolism (PE) is a sudden blockage or decrease of blood flow in one or both lungs. PE is a dangerous and life-threatening condition that needs to be treated right away.  Treatments for this condition usually include medicines to thin your blood (anticoagulants) or medicines to break apart blood clots (thrombolytics).  If you are given blood thinners, it is important to take the medicine every day at the same time each day.  Understand what foods and drugs interact with any medicines that you are taking.  If you have signs of PE or DVT, call your local emergency services (911 in the [place] This information is not intended to replace advice given to you by your health care provider. Make sure you discuss any questions you have with your health care provider. Document Revised: 04/08/2018 Document Reviewed: 04/08/2018 Elsevier Patient Education  2020 [employer]  MAKE SURE TO CALL HEMATOLOGIST

## 2020-01-27 LAB — BMP8+EGFR
BUN/Creatinine Ratio: 19 (ref 12–28)
BUN: 22 mg/dL (ref 8–27)
CO2: 26 mmol/L (ref 20–29)
Calcium: 9.8 mg/dL (ref 8.7–10.3)
Chloride: 101 mmol/L (ref 96–106)
Creatinine, Ser: 1.17 mg/dL — ABNORMAL HIGH (ref 0.57–1.00)
GFR calc Af Amer: 58 mL/min/{1.73_m2} — ABNORMAL LOW (ref >59)
GFR calc non Af Amer: 50 mL/min/{1.73_m2} — ABNORMAL LOW (ref >59)
Glucose: 96 mg/dL (ref 65–99)
Potassium: 4 mmol/L (ref 3.5–5.2)
Sodium: 140 mmol/L (ref 134–144)

## 2020-01-27 LAB — LIPID PANEL
Chol/HDL Ratio: 2.9 ratio (ref 0.0–4.4)
Cholesterol, Total: 162 mg/dL (ref 100–199)
HDL: 55 mg/dL (ref >39)
LDL Chol Calc (NIH): 95 mg/dL (ref 0–99)
Triglycerides: 61 mg/dL (ref 0–149)
VLDL Cholesterol Cal: 12 mg/dL (ref 5–40)

## 2020-02-24 ENCOUNTER — Encounter: Payer: BC Managed Care – PPO | Admitting: Hematology and Oncology

## 2020-03-01 ENCOUNTER — Inpatient Hospital Stay: Payer: BC Managed Care – PPO | Admitting: Hematology and Oncology

## 2020-03-01 NOTE — Assessment & Plan Note (Deleted)
Hospitalization 01/21/2020-01/22/2020 for acute bilateral PE, presented with chest pain and shortness of breath. CT angiogram bilateral segmental PEs without heart strain. Current treatment: Eliquis Potential etiologies: Obesity, family history   I discussed with the patient risk factors for blood clots.  Inherited risk factors that will be tested include: 1. Factor V Leiden mutation 2. Prothrombin gene [id number] 3. Protein S deficiency  4. Protein C deficiency  5. Antithrombin deficiency  Acquired risk factors include: 1. Antiphospholipid antibody syndrome: Will be checked 2. Tobacco use 3. Obesity 4. Medications including oral contraceptives 5. Sedentary behavior including postoperative state 6. Foreign bodies in circulation  Workup recommended: Bloodwork to evaluate for the 5 inherited factors mentioned above along with antiphospholipid antibodies. Return to clinic in one to 2 weeks to discuss the results of the tests

## 2020-03-17 ENCOUNTER — Other Ambulatory Visit: Payer: Self-pay

## 2020-03-20 NOTE — Progress Notes (Signed)
[facility] CONSULT NOTE  Patient Care Team: [name], FNP as PCP - General (General Practice)  CHIEF COMPLAINTS/PURPOSE OF CONSULTATION:  Newly diagnosed pulmonary embolism   HISTORY OF PRESENTING ILLNESS:  Joan [name] 64 y.o. female is here because of recent diagnosis of pulmonary embolism. She was admitted from 01/21/20-01/22/20 after presenting to the ED for shortness of breath x5 days. CT angio chest showed bilateral segmental pulmonary emboli and a lower extremity US showed an age indeterminate DVT in the left femoral vein. She was started in heparin before transitioning to Eliquis. She has a family history of DVT in her mother and antiphospholipid syndrome in her daughter. She presents to the clinic today for initial evaluation.   I reviewed her records extensively and collaborated the history with the patient.  MEDICAL HISTORY:  Past Medical History:  Diagnosis Date  . Acid reflux   . Diabetes mellitus without complication (HCC)    Pt states she is prediabetic  . Hypertension   . OSA (obstructive sleep apnea)     SURGICAL HISTORY: Past Surgical History:  Procedure Laterality Date  . ABDOMINAL HYSTERECTOMY    . TONSILECTOMY, ADENOIDECTOMY, BILATERAL MYRINGOTOMY AND TUBES      SOCIAL HISTORY: Social History   Socioeconomic History  . Marital status: Single    Spouse name: Not on file  . Number of children: Not on file  . Years of education: Not on file  . Highest education level: Not on file  Occupational History  . Occupation: CNA  Tobacco Use  . Smoking status: Never Smoker  . Smokeless tobacco: Never Used  Substance and Sexual Activity  . Alcohol use: No  . Drug use: No  . Sexual activity: Not on file  Other Topics Concern  . Not on file  Social History Narrative  . Not on file   Social Determinants of Health   Financial Resource Strain:   . Difficulty of Paying Living Expenses: Not on file  Food Insecurity:   . Worried  About Programme researcher, broadcasting/film/video in the Last Year: Not on file  . Ran Out of Food in the Last Year: Not on file  Transportation Needs:   . Lack of Transportation (Medical): Not on file  . Lack of Transportation (Non-Medical): Not on file  Physical Activity:   . Days of Exercise per Week: Not on file  . Minutes of Exercise per Session: Not on file  Stress:   . Feeling of Stress : Not on file  Social Connections:   . Frequency of Communication with Friends and Family: Not on file  . Frequency of Social Gatherings with Friends and Family: Not on file  . Attends Religious Services: Not on file  . Active Member of Clubs or Organizations: Not on file  . Attends Banker Meetings: Not on file  . Marital Status: Not on file  Intimate Partner Violence:   . Fear of Current or Ex-Partner: Not on file  . Emotionally Abused: Not on file  . Physically Abused: Not on file  . Sexually Abused: Not on file    FAMILY HISTORY: Family History  Problem Relation Age of Onset  . Heart disease Mother   . Heart disease Father   . Prostate cancer Father     ALLERGIES:  is allergic to penicillins.  MEDICATIONS:  Current Outpatient Medications  Medication Sig Dispense Refill  . albuterol (VENTOLIN HFA) 108 (90 Base) MCG/ACT inhaler Inhale 2 puffs into the lungs every 6 (  six) hours as needed for wheezing or shortness of breath. 18 g 1  . APIXABAN (ELIQUIS) VTE STARTER PACK (10MG  AND 5MG ) Take as directed on package: start with two-5mg  tablets twice daily for 7 days. On day 8, switch to one-5mg  tablet twice daily. 1 each 0  . ASCORBIC ACID PO Take 1 tablet by mouth daily. (Patient not taking: Reported on 01/26/2020)    . cholecalciferol (VITAMIN D3) 25 MCG (1000 UT) tablet Take 1,000 Units by mouth daily.    . CYCLOBENZAPRINE HCL PO Take 1 tablet by mouth at bedtime as needed.    . diclofenac sodium (VOLTAREN) 1 % GEL Apply 2 g topically 4 (four) times daily. 100 g 2  . ezetimibe-simvastatin  (VYTORIN) 10-40 MG tablet Take 1 tablet by mouth once daily 90 tablet 0  . Fe Fum-FePoly-FA-Vit C-Vit B3 (INTEGRA F) 125-1 MG CAPS Take 1 tablet by mouth daily. 30 capsule 2  . hydrochlorothiazide (HYDRODIURIL) 25 MG tablet Take 1 tablet by mouth once daily 90 tablet 0  . Insulin Pen Needle (PEN NEEDLES) 32G X 4 MM MISC 1 each by Does not apply route daily. Use as directed daily with saxenda 100 each 3  . lisinopril-hydrochlorothiazide (ZESTORETIC) 20-25 MG tablet Take 1 tablet by mouth daily. 90 tablet 1  . methocarbamol (ROBAXIN) 750 MG tablet Take 750 mg by mouth 2 (two) times daily as needed for muscle spasms.    . Multiple Vitamins-Minerals (MULTIVITAMIN PO) Take 1 tablet by mouth daily.    MYRBETRIQ 50 MG TB24 tablet Take 50 mg by mouth daily.    01/28/2020 omeprazole (PRILOSEC) 40 MG capsule Take 40 mg by mouth daily.    Marland Kitchen SAXENDA 18 MG/3ML SOPN Inject 3 mg into the skin daily. (Patient not taking: Reported on 01/26/2020)    . solifenacin (VESICARE) 10 MG tablet Take 1 tablet by mouth once daily 30 tablet 0   No current facility-administered medications for this visit.    REVIEW OF SYSTEMS:   Constitutional: Denies fevers, chills or abnormal night sweats Eyes: Denies blurriness of vision, double vision or watery eyes Ears, nose, mouth, throat, and face: Denies mucositis or sore throat Respiratory: Denies cough, dyspnea or wheezes Cardiovascular: Denies palpitation, chest discomfort or lower extremity swelling Gastrointestinal:  Denies nausea, heartburn or change in bowel habits Skin: Denies abnormal skin rashes Lymphatics: Denies new lymphadenopathy or easy bruising Neurological:Denies numbness, tingling or new weaknesses Behavioral/Psych: Mood is stable, no new changes  Breast: Denies any palpable lumps or discharge All other systems were reviewed with the patient and are negative.  PHYSICAL EXAMINATION: ECOG PERFORMANCE STATUS: 1 - Symptomatic but completely ambulatory  Vitals:    03/21/20 1446  BP: (!) 142/71  Pulse: 60  Resp: 17  Temp: (!) 97.5 F (36.4 C)  SpO2: 98%   Filed Weights   03/21/20 1446  Weight: (!) 336 lb (152.4 kg)    GENERAL:alert, no distress and comfortable SKIN: skin color, texture, turgor are normal, no rashes or significant lesions EYES: normal, conjunctiva are pink and non-injected, sclera clear OROPHARYNX:no exudate, no erythema and lips, buccal mucosa, and tongue normal  NECK: supple, thyroid normal size, non-tender, without nodularity LYMPH:  no palpable lymphadenopathy in the cervical, axillary or inguinal LUNGS: clear to auscultation and percussion with normal breathing effort HEART: regular rate & rhythm and no murmurs and no lower extremity edema ABDOMEN:abdomen soft, non-tender and normal bowel sounds Musculoskeletal:no cyanosis of digits and no clubbing  PSYCH: alert & oriented x 3 with  fluent speech NEURO: no focal motor/sensory deficits  LABORATORY DATA:  I have reviewed the data as listed Lab Results  Component Value Date   WBC 7.0 01/22/2020   HGB 10.0 (L) 01/22/2020   HCT 30.9 (L) 01/22/2020   MCV 83.7 01/22/2020   PLT 193 01/22/2020   Lab Results  Component Value Date   NA 140 01/26/2020   K 4.0 01/26/2020   CL 101 01/26/2020   CO2 26 01/26/2020    RADIOGRAPHIC STUDIES: I have personally reviewed the radiological reports and agreed with the findings in the report.  ASSESSMENT AND PLAN:  Acute pulmonary embolism (HCC) 01/21/2020: CT angiogram which showed bilateral segmental pulmonary emboli without evidence of right heart strain lower extremity Dopplers which showed an age-indeterminate DVT involving the left femoral vein  Current treatment: Eliquis  Chronic/recurrent blood clots I discussed with the patient risk factors for blood clots.  Inherited risk factors include: 1. Factor V Leiden mutation 2. Prothrombin gene G20210A 3. Protein S deficiency  4. Protein C deficiency  5. Antithrombin  deficiency  Acquired risk factors include: 1. Antiphospholipid antibody syndrome 2. Obesity 3. Sedentary behavior   Workup recommended: Bloodwork to evaluate for the inherited factors mentioned above along with antiphospholipid antibodies.   Patient patient has prior history of anemia and iron deficiency.  She is currently on oral iron tablets.  We will send for iron studies with ferritin as well.  Return to clinic in one to 1 weeks to discuss the results of the tests through MyChart virtual visit  All questions were answered. The patient knows to call the clinic with any problems, questions or concerns.   Sabas Sous, MD, MPH 03/21/2020    I, Molly Dorshimer, am acting as scribe for Serena Croissant, MD.  I have reviewed the above documentation for accuracy and completeness, and I agree with the above.

## 2020-03-21 ENCOUNTER — Other Ambulatory Visit: Payer: Self-pay

## 2020-03-21 ENCOUNTER — Telehealth: Payer: Self-pay | Admitting: Hematology and Oncology

## 2020-03-21 ENCOUNTER — Inpatient Hospital Stay: Payer: BC Managed Care – PPO | Attending: Hematology and Oncology | Admitting: Hematology and Oncology

## 2020-03-21 ENCOUNTER — Inpatient Hospital Stay: Payer: BC Managed Care – PPO

## 2020-03-21 ENCOUNTER — Other Ambulatory Visit: Payer: Self-pay | Admitting: Nurse Practitioner

## 2020-03-21 DIAGNOSIS — Z7901 Long term (current) use of anticoagulants: Secondary | ICD-10-CM | POA: Diagnosis not present

## 2020-03-21 DIAGNOSIS — I82412 Acute embolism and thrombosis of left femoral vein: Secondary | ICD-10-CM | POA: Diagnosis not present

## 2020-03-21 DIAGNOSIS — I10 Essential (primary) hypertension: Secondary | ICD-10-CM

## 2020-03-21 DIAGNOSIS — D649 Anemia, unspecified: Secondary | ICD-10-CM | POA: Insufficient documentation

## 2020-03-21 DIAGNOSIS — Z832 Family history of diseases of the blood and blood-forming organs and certain disorders involving the immune mechanism: Secondary | ICD-10-CM | POA: Diagnosis not present

## 2020-03-21 DIAGNOSIS — I2699 Other pulmonary embolism without acute cor pulmonale: Secondary | ICD-10-CM | POA: Insufficient documentation

## 2020-03-21 DIAGNOSIS — E611 Iron deficiency: Secondary | ICD-10-CM | POA: Insufficient documentation

## 2020-03-21 DIAGNOSIS — Z8042 Family history of malignant neoplasm of prostate: Secondary | ICD-10-CM

## 2020-03-21 LAB — CBC WITH DIFFERENTIAL (CANCER CENTER ONLY)
Abs Immature Granulocytes: 0.01 10*3/uL (ref 0.00–0.07)
Basophils Absolute: 0 10*3/uL (ref 0.0–0.1)
Basophils Relative: 1 %
Eosinophils Absolute: 0.1 10*3/uL (ref 0.0–0.5)
Eosinophils Relative: 2 %
HCT: 32 % — ABNORMAL LOW (ref 36.0–46.0)
Hemoglobin: 10.3 g/dL — ABNORMAL LOW (ref 12.0–15.0)
Immature Granulocytes: 0 %
Lymphocytes Relative: 29 %
Lymphs Abs: 1.7 10*3/uL (ref 0.7–4.0)
MCH: 26.5 pg (ref 26.0–34.0)
MCHC: 32.2 g/dL (ref 30.0–36.0)
MCV: 82.3 fL (ref 80.0–100.0)
Monocytes Absolute: 0.6 10*3/uL (ref 0.1–1.0)
Monocytes Relative: 9 %
Neutro Abs: 3.6 10*3/uL (ref 1.7–7.7)
Neutrophils Relative %: 59 %
Platelet Count: 241 10*3/uL (ref 150–400)
RBC: 3.89 MIL/uL (ref 3.87–5.11)
RDW: 14.1 % (ref 11.5–15.5)
WBC Count: 6.1 10*3/uL (ref 4.0–10.5)
nRBC: 0 % (ref 0.0–0.2)

## 2020-03-21 MED ORDER — APIXABAN 5 MG PO TABS: 5.0000 mg | ORAL_TABLET | Freq: Two times a day (BID) | ORAL | 11 refills | Status: DC

## 2020-03-21 NOTE — Assessment & Plan Note (Signed)
01/21/2020: CT angiogram which showed bilateral segmental pulmonary emboli without evidence of right heart strain lower extremity Dopplers which showed an age-indeterminate DVT involving the left femoral vein  Current treatment: Eliquis  Chronic/recurrent blood clots I discussed with the patient risk factors for blood clots.  Inherited risk factors include: 1. Factor V Leiden mutation 2. Prothrombin gene G20210A 3. Protein S deficiency  4. Protein C deficiency  5. Antithrombin deficiency  Acquired risk factors include: 1. Antiphospholipid antibody syndrome 2. Tobacco use 3. Obesity 4. Sedentary behavior   Workup recommended: Bloodwork to evaluate for the 5 inherited factors mentioned above along with antiphospholipid antibodies. Return to clinic in one to 2 weeks to discuss the results of the tests

## 2020-03-21 NOTE — Telephone Encounter (Signed)
Scheduled appts per 9/7 los. Gave pt a print out of AVS.  

## 2020-03-22 LAB — IRON AND TIBC
Iron: 45 ug/dL (ref 41–142)
Saturation Ratios: 16 % — ABNORMAL LOW (ref 21–57)
TIBC: 279 ug/dL (ref 236–444)
UIBC: 234 ug/dL (ref 120–384)

## 2020-03-22 LAB — LUPUS ANTICOAGULANT PANEL
DRVVT: 44.6 s (ref 0.0–47.0)
PTT Lupus Anticoagulant: 33.1 s (ref 0.0–51.9)

## 2020-03-22 LAB — CARDIOLIPIN ANTIBODIES, IGG, IGM, IGA
Anticardiolipin IgA: <9 APL U/mL (ref 0–11)
Anticardiolipin IgG: <9 GPL U/mL (ref 0–14)
Anticardiolipin IgM: <9 MPL U/mL (ref 0–12)

## 2020-03-22 LAB — FERRITIN: Ferritin: 208 ng/mL (ref 11–307)

## 2020-03-22 LAB — BETA-2-GLYCOPROTEIN I ABS, IGG/M/A
Beta-2 Glyco I IgG: <9 GPI IgG units (ref 0–20)
Beta-2-Glycoprotein I IgA: <9 GPI IgA units (ref 0–25)
Beta-2-Glycoprotein I IgM: <9 GPI IgM units (ref 0–32)

## 2020-03-22 LAB — PROTEIN S, TOTAL: Protein S Ag, Total: 126 % (ref 60–150)

## 2020-03-22 LAB — ANTITHROMBIN III ANTIGEN: AT III AG PPP IMM-ACNC: 123 % (ref 72–124)

## 2020-03-23 LAB — PROTEIN C, TOTAL: Protein C, Total: 117 % (ref 60–150)

## 2020-03-24 LAB — FACTOR 5 LEIDEN

## 2020-03-27 LAB — PROTHROMBIN GENE MUTATION

## 2020-03-29 NOTE — Progress Notes (Signed)
HEMATOLOGY-ONCOLOGY MYCHART VIDEO VISIT PROGRESS NOTE  I connected with [place] on 03/30/2020 at 10:45 AM EDT by MyChart video conference and verified that I am speaking with the correct person using two identifiers.  I discussed the limitations, risks, security and privacy concerns of performing an evaluation and management service by MyChart and the availability of in person appointments.  I also discussed with the patient that there may be a patient responsible charge related to this service. The patient expressed understanding and agreed to proceed.  Patient's Location: Home Physician Location: Clinic  CHIEF COMPLIANT: Follow-up of pulmonary embolism on Eliquis  INTERVAL HISTORY: Joan [name] is a 64 y.o. female with above-mentioned history of pulmonary embolism currently on anticoagulation with Eliquis. She presents over MyChart today for follow-up to review her labs.   Observations/Objective:  There were no vitals filed for this visit. There is no height or weight on file to calculate BMI.  I have reviewed the data as listed CMP Latest Ref Rng & Units 01/26/2020 01/21/2020 09/21/2019  Glucose 65 - 99 mg/dL 96 111(H) 87  BUN 8 - 27 mg/dL 22 23 24   Creatinine 0.57 - 1.00 mg/dL 1.17(H) 1.18(H) 0.97  Sodium 134 - 144 mmol/L 140 141 144  Potassium 3.5 - 5.2 mmol/L 4.0 3.2(L) 4.1  Chloride 96 - 106 mmol/L 101 104 103  CO2 20 - 29 mmol/L 26 25 27   Calcium 8.7 - 10.3 mg/dL 9.8 8.9 9.3  Total Protein 6.0 - 8.5 g/dL - - -  Total Bilirubin 0.0 - 1.2 mg/dL - - -  Alkaline Phos 39 - 117 IU/L - - -  AST 0 - 40 IU/L - - -  ALT 0 - 32 IU/L - - -    Lab Results  Component Value Date   WBC 6.1 03/21/2020   HGB 10.3 (L) 03/21/2020   HCT 32.0 (L) 03/21/2020   MCV 82.3 03/21/2020   PLT 241 03/21/2020   NEUTROABS 3.6 03/21/2020      Assessment Plan:  Acute pulmonary embolism ([place]) 01/21/2020: CT angiogram which showed bilateral segmental pulmonary emboli without evidence of  right heart strain lower extremity Dopplers which showed an age-indeterminate DVT involving the left femoral vein  Current treatment: Eliquis  Chronic/recurrent blood clots I discussed with the patient risk factors for blood clots.  Inherited risk factors : Came back completely negative for the following abnormalities 1. Factor V Leiden mutation 2. Prothrombin gene G20210A 3. Protein S deficiency  4. Protein C deficiency  5. Antithrombin deficiency  Acquired risk factors include: 1. Antiphospholipid antibody syndrome: Negative 2. Obesity 3. Sedentary behavior  Recommendation: Anticoagulation for 1 year until July 2022  Patient patient has prior history of anemia and iron deficiency.  She is currently on oral iron tablets.    Iron saturation 16%, ferritin 208, hemoglobin 10.3, MCV 82.3 04/13/2018: Hemoglobin 10.2 This is her baseline. If the hemoglobin continues to decline below 10 g I might consider doing a bone marrow biopsy in the future.  I suspect that the cause of anemia is less likely to be iron deficiency but more likely to be anemia of chronic inflammation  Return to clinic in 6 months with labs and follow-up     I discussed the assessment and treatment plan with the patient. The patient was provided an opportunity to ask questions and all were answered. The patient agreed with the plan and demonstrated an understanding of the instructions. The patient was advised to call back or seek an  in-person evaluation if the symptoms worsen or if the condition fails to improve as anticipated.   I provided 20 minutes of face-to-face MyChart video visit time and charting and coordination of care during this encounter.    Rulon Eisenmenger, MD 03/30/2020   I, Molly Dorshimer, am acting as scribe for Nicholas Lose, MD.  I have reviewed the above documentation for accuracy and completeness, and I agree with the above.

## 2020-03-30 ENCOUNTER — Telehealth (HOSPITAL_BASED_OUTPATIENT_CLINIC_OR_DEPARTMENT_OTHER): Payer: BC Managed Care – PPO | Admitting: Hematology and Oncology

## 2020-03-30 DIAGNOSIS — D509 Iron deficiency anemia, unspecified: Secondary | ICD-10-CM

## 2020-03-30 DIAGNOSIS — I2699 Other pulmonary embolism without acute cor pulmonale: Secondary | ICD-10-CM | POA: Diagnosis not present

## 2020-03-30 NOTE — Assessment & Plan Note (Signed)
01/21/2020: CT angiogram which showed bilateral segmental pulmonary emboli without evidence of right heart strain lower extremity Dopplers which showed an age-indeterminate DVT involving the left femoral vein  Current treatment: Eliquis  Chronic/recurrent blood clots I discussed with the patient risk factors for blood clots.  Inherited risk factors : Came back completely negative for the following abnormalities 1. Factor V Leiden mutation 2. Prothrombin gene G20210A 3. Protein S deficiency  4. Protein C deficiency  5. Antithrombin deficiency  Acquired risk factors include: 1. Antiphospholipid antibody syndrome: Negative 2. Obesity 3. Sedentary behavior  Recommendation: Anticoagulation for 1 year  Patient patient has prior history of anemia and iron deficiency.  She is currently on oral iron tablets.    Iron saturation 16%, ferritin 208, hemoglobin 10.3, MCV 82.3 04/13/2018: Hemoglobin 10.2 This is her baseline. If the hemoglobin continues to decline below 10 g I might consider doing a bone marrow biopsy in the future.  I suspect that the cause of anemia is less likely to be iron deficiency but more likely to be anemia of chronic inflammation  Return to clinic in 6 months with labs and follow-up

## 2020-04-27 ENCOUNTER — Ambulatory Visit: Payer: BC Managed Care – PPO | Admitting: Nurse Practitioner

## 2020-04-27 ENCOUNTER — Other Ambulatory Visit: Payer: Self-pay

## 2020-04-27 ENCOUNTER — Encounter: Payer: Self-pay | Admitting: Nurse Practitioner

## 2020-04-27 VITALS — BP 160/102 | HR 73 | Temp 98.2°F | Ht 65.0 in | Wt 335.0 lb

## 2020-04-27 DIAGNOSIS — Z86711 Personal history of pulmonary embolism: Secondary | ICD-10-CM | POA: Diagnosis not present

## 2020-04-27 DIAGNOSIS — E782 Mixed hyperlipidemia: Secondary | ICD-10-CM | POA: Diagnosis not present

## 2020-04-27 DIAGNOSIS — Z6841 Body Mass Index (BMI) 40.0 and over, adult: Secondary | ICD-10-CM

## 2020-04-27 DIAGNOSIS — N289 Disorder of kidney and ureter, unspecified: Secondary | ICD-10-CM

## 2020-04-27 DIAGNOSIS — I10 Essential (primary) hypertension: Secondary | ICD-10-CM | POA: Diagnosis not present

## 2020-04-27 MED ORDER — EZETIMIBE-SIMVASTATIN 10-40 MG PO TABS: 1.0000 | ORAL_TABLET | Freq: Every day | ORAL | 1 refills | Status: AC

## 2020-04-27 MED ORDER — SAXENDA 18 MG/3ML ~~LOC~~ SOPN: 3.0000 mg | PEN_INJECTOR | Freq: Every day | SUBCUTANEOUS | 0 refills | Status: DC

## 2020-04-27 NOTE — Progress Notes (Signed)
I,Katawbba Wiggins,acting as a Education administrator for [employer], FNP.,have documented all relevant documentation on the behalf of Minette Brine, FNP,as directed by  Minette Brine, FNP while in the presence of Minette Brine, [place].  This visit occurred during the SARS-CoV-2 public health emergency.  Safety protocols were in place, including screening questions prior to the visit, additional usage of staff PPE, and extensive cleaning of exam room while observing appropriate contact time as indicated for disinfecting solutions.  Subjective:     Patient ID: Joan [name] , female    DOB: 1956/04/09 , 64 y.o.   MRN: [id number]   Chief Complaint  Patient presents with  . Hypertension    HPI  She is here today for a blood pressure check.  She denies eating high salt foods.  She admits to not taking her blood pressure medications for 2-3 days.  She also ate hot dogs and season fries last night.   She is scheduled to see a [facility]ist at [facility] because they are unable to do any further treatment  She is also going for physical therapy with [facility].    Hypertension This is a chronic problem. The current episode started more than 1 year ago. The problem has been gradually improving since onset. The problem is controlled. Pertinent negatives include no anxiety, chest pain, headaches, palpitations, peripheral edema or shortness of breath. Risk factors for coronary artery disease include sedentary lifestyle and obesity. Past treatments include ACE inhibitors and diuretics. Compliance problems: she reports taking her blood pressure medication at night.  There is no history of angina. There is no history of chronic renal disease.  Back Pain This is a recurrent problem. The current episode started more than 1 year ago. The problem occurs intermittently. The pain is present in the lumbar spine. The quality of the pain is described as aching. The symptoms are aggravated by standing (standing for long  periods.). Pertinent negatives include no chest pain or headaches.     Past Medical History:  Diagnosis Date  . Acid reflux   . Diabetes mellitus without complication ([place])    Pt states she is prediabetic  . Hypertension   . OSA (obstructive sleep apnea)      Family History  Problem Relation Age of Onset  . Heart disease Mother   . Heart disease Father   . Prostate cancer Father      Current Outpatient Medications:  .  albuterol (VENTOLIN HFA) 108 (90 Base) MCG/ACT inhaler, Inhale 2 puffs into the lungs every 6 (six) hours as needed for wheezing or shortness of breath., Disp: 18 g, Rfl: 1 .  apixaban (ELIQUIS) 5 MG TABS tablet, Take 1 tablet (5 mg total) by mouth 2 (two) times daily., Disp: 60 tablet, Rfl: 11 .  cholecalciferol (VITAMIN D3) 25 MCG (1000 UT) tablet, Take 1,000 Units by mouth daily., Disp: , Rfl:  .  CYCLOBENZAPRINE HCL PO, Take 1 tablet by mouth at bedtime as needed., Disp: , Rfl:  .  diclofenac sodium (VOLTAREN) 1 % GEL, Apply 2 g topically 4 (four) times daily., Disp: 100 g, Rfl: 2 .  ezetimibe-simvastatin (VYTORIN) 10-40 MG tablet, Take 1 tablet by mouth daily., Disp: 90 tablet, Rfl: 1 .  Fe Fum-FePoly-FA-Vit C-Vit B3 (INTEGRA F) 125-1 MG CAPS, Take 1 tablet by mouth daily., Disp: 30 capsule, Rfl: 2 .  hydrochlorothiazide (HYDRODIURIL) 25 MG tablet, Take 1 tablet by mouth once daily, Disp: 90 tablet, Rfl: 0 .  Insulin Pen Needle (PEN NEEDLES) 32G X  4 MM MISC, 1 each by Does not apply route daily. Use as directed daily with saxenda, Disp: 100 each, Rfl: 3 .  Liraglutide -Weight Management (SAXENDA) 18 MG/3ML SOPN, Inject 3 mg into the skin daily., Disp: 15 mL, Rfl: 0 .  lisinopril-hydrochlorothiazide (ZESTORETIC) 20-25 MG tablet, Take 1 tablet by mouth once daily, Disp: 90 tablet, Rfl: 0 .  methocarbamol (ROBAXIN) 750 MG tablet, Take 750 mg by mouth 2 (two) times daily as needed for muscle spasms., Disp: , Rfl:  .  Multiple Vitamins-Minerals (MULTIVITAMIN PO), Take  1 tablet by mouth daily., Disp: , Rfl:  .  MYRBETRIQ 50 MG TB24 tablet, Take 50 mg by mouth daily., Disp: , Rfl:  .  omeprazole (PRILOSEC) 40 MG capsule, Take 40 mg by mouth daily., Disp: , Rfl:  .  solifenacin (VESICARE) 10 MG tablet, Take 1 tablet by mouth once daily, Disp: 30 tablet, Rfl: 0 .  ASCORBIC ACID PO, Take 1 tablet by mouth daily. (Patient not taking: Reported on 01/26/2020), Disp: , Rfl:    Allergies  Allergen Reactions  . Penicillins     REACTION: hives     Review of Systems  Constitutional: Negative.   Respiratory: Negative.  Negative for shortness of breath.   Cardiovascular: Negative.  Negative for chest pain and palpitations.  Gastrointestinal: Negative.   Musculoskeletal: Positive for back pain.       Right shoulder pain - she has taping in place to her shoulder.    Neurological: Negative for dizziness and headaches.  Psychiatric/Behavioral: Negative.   All other systems reviewed and are negative.    Today's Vitals   04/27/20 1619  BP: (!) 160/102  Pulse: 73  Temp: 98.2 F (36.8 C)  TempSrc: Oral  Weight: (!) 335 lb (152 kg)  Height: 5' 5"  (1.651 m)   Body mass index is 55.75 kg/m.  Wt Readings from Last 3 Encounters:  04/27/20 (!) 335 lb (152 kg)  03/21/20 (!) 336 lb (152.4 kg)  01/26/20 (!) 331 lb 12.8 oz (150.5 kg)    Objective:  Physical Exam Vitals and nursing note reviewed.  Constitutional:      General: She is not in acute distress.    Appearance: Normal appearance. She is obese.  HENT:     Head: Normocephalic and atraumatic.  Cardiovascular:     Rate and Rhythm: Normal rate and regular rhythm.     Heart sounds: Normal heart sounds.  Pulmonary:     Effort: Pulmonary effort is normal. No respiratory distress.     Breath sounds: Normal breath sounds.  Skin:    General: Skin is warm.  Neurological:     General: No focal deficit present.     Mental Status: She is alert and oriented to person, place, and time.     Cranial Nerves: No  cranial nerve deficit.  Psychiatric:        Mood and Affect: Mood normal.        Behavior: Behavior normal.        Thought Content: Thought content normal.        Judgment: Judgment normal.         Assessment And Plan:     1. Essential hypertension  Chronic, blood pressure is elevated today she admits to not taking her medications today and for a few days prior.  I have encouraged her again to adhere to taking her medications regularly. She took her medication while in the office.  - CMP14+EGFR  2. Class 3  severe obesity without serious comorbidity with body mass index (BMI) of 50.0 to 59.9 in adult, unspecified obesity type ([place])  Chronic  Discussed healthy diet and regular exercise options   Encouraged to exercise at least 150 minutes per week with 2 days of strength training  I have explained once again to her to she can not just take the 89m dose of saxenda without titrating up this will cause her nausea/vomiting.   If she is not successful this time with her weight loss and adherence I will refer to a weight management clinic  Return in 2 months for weight check. - Liraglutide -Weight Management (SAXENDA) 18 MG/3ML SOPN; Inject 3 mg into the skin daily.  Dispense: 15 mL; Refill: 0  3. Mixed hyperlipidemia  Chronic, controlled  Continue with current medications, tolerating medications well - ezetimibe-simvastatin (VYTORIN) 10-40 MG tablet; Take 1 tablet by mouth daily.  Dispense: 90 tablet; Refill: 1  4. History of pulmonary embolism  She has seen hematology and no abnormal findings on having a blood disorder  She does not want to take the booster covid since she feels this may be the cause of her clots.  Continue with Eliquis  5. Abnormal kidney function  She is encouraged to stay well hydrated with water. - CMP14+EGFR  She is encouraged to strive for BMI less than 30 to decrease cardiac risk. Advised to aim for at least 150 minutes of exercise per  week.   Patient was given opportunity to ask questions. Patient verbalized understanding of the plan and was able to repeat key elements of the plan. All questions were answered to their satisfaction.    ITeola Bradley FNP, have reviewed all documentation for this visit. The documentation on 05/05/20 for the exam, diagnosis, procedures, and orders are all accurate and complete.  THE PATIENT IS ENCOURAGED TO PRACTICE SOCIAL DISTANCING DUE TO THE COVID-19 PANDEMIC.

## 2020-04-28 LAB — CMP14+EGFR
ALT: 12 IU/L (ref 0–32)
AST: 15 IU/L (ref 0–40)
Albumin/Globulin Ratio: 1.5 (ref 1.2–2.2)
Albumin: 4 g/dL (ref 3.8–4.8)
Alkaline Phosphatase: 86 IU/L (ref 44–121)
BUN/Creatinine Ratio: 22 (ref 12–28)
BUN: 20 mg/dL (ref 8–27)
Bilirubin Total: 0.3 mg/dL (ref 0.0–1.2)
CO2: 25 mmol/L (ref 20–29)
Calcium: 9.5 mg/dL (ref 8.7–10.3)
Chloride: 106 mmol/L (ref 96–106)
Creatinine, Ser: 0.93 mg/dL (ref 0.57–1.00)
GFR calc Af Amer: 76 mL/min/{1.73_m2} (ref >59)
GFR calc non Af Amer: 66 mL/min/{1.73_m2} (ref >59)
Globulin, Total: 2.7 g/dL (ref 1.5–4.5)
Glucose: 107 mg/dL — ABNORMAL HIGH (ref 65–99)
Potassium: 4.5 mmol/L (ref 3.5–5.2)
Sodium: 143 mmol/L (ref 134–144)
Total Protein: 6.7 g/dL (ref 6.0–8.5)

## 2020-05-01 ENCOUNTER — Other Ambulatory Visit: Payer: Self-pay | Admitting: Orthopedic Surgery

## 2020-05-01 DIAGNOSIS — M25511 Pain in right shoulder: Secondary | ICD-10-CM

## 2020-05-09 ENCOUNTER — Other Ambulatory Visit: Payer: Self-pay | Admitting: Orthopedic Surgery

## 2020-05-09 DIAGNOSIS — M25511 Pain in right shoulder: Secondary | ICD-10-CM

## 2020-05-30 ENCOUNTER — Ambulatory Visit
Admission: RE | Admit: 2020-05-30 | Discharge: 2020-05-30 | Disposition: A | Discharge status: Home / Self Care | Payer: BC Managed Care – PPO | Source: Ambulatory Visit | Attending: Orthopedic Surgery | Admitting: Orthopedic Surgery

## 2020-05-30 ENCOUNTER — Other Ambulatory Visit: Payer: Self-pay

## 2020-05-30 DIAGNOSIS — M25511 Pain in right shoulder: Secondary | ICD-10-CM

## 2020-06-29 ENCOUNTER — Encounter: Payer: Self-pay | Admitting: Nurse Practitioner

## 2020-06-29 ENCOUNTER — Ambulatory Visit: Payer: BC Managed Care – PPO | Admitting: Nurse Practitioner

## 2020-06-29 ENCOUNTER — Other Ambulatory Visit: Payer: Self-pay

## 2020-06-29 ENCOUNTER — Other Ambulatory Visit (HOSPITAL_COMMUNITY)
Admission: RE | Admit: 2020-06-29 | Discharge: 2020-06-29 | Disposition: A | Discharge status: Home / Self Care | Payer: BC Managed Care – PPO | Source: Ambulatory Visit | Attending: Nurse Practitioner | Admitting: Nurse Practitioner

## 2020-06-29 VITALS — BP 168/102 | HR 88 | Temp 98.2°F | Ht 65.0 in | Wt 339.4 lb

## 2020-06-29 DIAGNOSIS — N898 Other specified noninflammatory disorders of vagina: Secondary | ICD-10-CM | POA: Insufficient documentation

## 2020-06-29 DIAGNOSIS — Z6841 Body Mass Index (BMI) 40.0 and over, adult: Secondary | ICD-10-CM

## 2020-06-29 DIAGNOSIS — Z23 Encounter for immunization: Secondary | ICD-10-CM | POA: Diagnosis not present

## 2020-06-29 DIAGNOSIS — Z0000 Encounter for general adult medical examination without abnormal findings: Secondary | ICD-10-CM | POA: Diagnosis not present

## 2020-06-29 DIAGNOSIS — I10 Essential (primary) hypertension: Secondary | ICD-10-CM

## 2020-06-29 DIAGNOSIS — E559 Vitamin D deficiency, unspecified: Secondary | ICD-10-CM

## 2020-06-29 DIAGNOSIS — Z1231 Encounter for screening mammogram for malignant neoplasm of breast: Secondary | ICD-10-CM

## 2020-06-29 DIAGNOSIS — E782 Mixed hyperlipidemia: Secondary | ICD-10-CM | POA: Diagnosis not present

## 2020-06-29 DIAGNOSIS — N3000 Acute cystitis without hematuria: Secondary | ICD-10-CM

## 2020-06-29 LAB — POCT URINALYSIS DIPSTICK
Bilirubin, UA: NEGATIVE
Blood, UA: NEGATIVE
Glucose, UA: NEGATIVE
Leukocytes, UA: NEGATIVE
Nitrite, UA: POSITIVE
Protein, UA: POSITIVE — AB
Spec Grav, UA: 1.025 (ref 1.010–1.025)
Urobilinogen, UA: 1 E.U./dL
pH, UA: 6.5 (ref 5.0–8.0)

## 2020-06-29 LAB — POCT UA - MICROALBUMIN
Albumin/Creatinine Ratio, Urine, POC: 300
Creatinine, POC: 300 mg/dL
Microalbumin Ur, POC: 150 mg/L

## 2020-06-29 NOTE — Patient Instructions (Addendum)
Health Maintenance, Female Adopting a healthy lifestyle and getting preventive care are important in promoting health and wellness. Ask your health care provider about:  The right schedule for you to have regular tests and exams.  Things you can do on your own to prevent diseases and keep yourself healthy. What should I know about diet, weight, and exercise? Eat a healthy diet   Eat a diet that includes plenty of vegetables, fruits, low-fat dairy products, and lean protein.  Do not eat a lot of foods that are high in solid fats, added sugars, or sodium. Maintain a healthy weight Body mass index (BMI) is used to identify weight problems. It estimates body fat based on height and weight. Your health care provider can help determine your BMI and help you achieve or maintain a healthy weight. Get regular exercise Get regular exercise. This is one of the most important things you can do for your health. Most adults should:  Exercise for at least 150 minutes each week. The exercise should increase your heart rate and make you sweat (moderate-intensity exercise).  Do strengthening exercises at least twice a week. This is in addition to the moderate-intensity exercise.  Spend less time sitting. Even light physical activity can be beneficial. Watch cholesterol and blood lipids Have your blood tested for lipids and cholesterol at 63 years of age, then have this test every 5 years. Have your cholesterol levels checked more often if:  Your lipid or cholesterol levels are high.  You are older than 63 years of age.  You are at high risk for heart disease. What should I know about cancer screening? Depending on your health history and family history, you may need to have cancer screening at various ages. This may include screening for:  Breast cancer.  Cervical cancer.  Colorectal cancer.  Skin cancer.  Lung cancer. What should I know about heart disease, diabetes, and high blood  pressure? Blood pressure and heart disease  High blood pressure causes heart disease and increases the risk of stroke. This is more likely to develop in people who have high blood pressure readings, are of African descent, or are overweight.  Have your blood pressure checked: ? Every 3-5 years if you are 18-39 years of age. ? Every year if you are 40 years old or older. Diabetes Have regular diabetes screenings. This checks your fasting blood sugar level. Have the screening done:  Once every three years after age 40 if you are at a normal weight and have a low risk for diabetes.  More often and at a younger age if you are overweight or have a high risk for diabetes. What should I know about preventing infection? Hepatitis B If you have a higher risk for hepatitis B, you should be screened for this virus. Talk with your health care provider to find out if you are at risk for hepatitis B infection. Hepatitis C Testing is recommended for:  Everyone born from 1945 through 1965.  Anyone with known risk factors for hepatitis C. Sexually transmitted infections (STIs)  Get screened for STIs, including gonorrhea and chlamydia, if: ? You are sexually active and are younger than 63 years of age. ? You are older than 63 years of age and your health care provider tells you that you are at risk for this type of infection. ? Your sexual activity has changed since you were last screened, and you are at increased risk for chlamydia or gonorrhea. Ask your health care provider if   you are at risk.  Ask your health care provider about whether you are at high risk for HIV. Your health care provider may recommend a prescription medicine to help prevent HIV infection. If you choose to take medicine to prevent HIV, you should first get tested for HIV. You should then be tested every 3 months for as long as you are taking the medicine. Pregnancy  If you are about to stop having your period (premenopausal) and  you may become pregnant, seek counseling before you get pregnant.  Take 400 to 800 micrograms (mcg) of folic acid every day if you become pregnant.  Ask for birth control (contraception) if you want to prevent pregnancy. Osteoporosis and menopause Osteoporosis is a disease in which the bones lose minerals and strength with aging. This can result in bone fractures. If you are 52 years old or older, or if you are at risk for osteoporosis and fractures, ask your health care provider if you should:  Be screened for bone loss.  Take a calcium or vitamin D supplement to lower your risk of fractures.  Be given hormone replacement therapy (HRT) to treat symptoms of menopause. Follow these instructions at home: Lifestyle  Do not use any products that contain nicotine or tobacco, such as cigarettes, e-cigarettes, and chewing tobacco. If you need help quitting, ask your health care provider.  Do not use street drugs.  Do not share needles.  Ask your health care provider for help if you need support or information about quitting drugs. Alcohol use  Do not drink alcohol if: ? Your health care provider tells you not to drink. ? You are pregnant, may be pregnant, or are planning to become pregnant.  If you drink alcohol: ? Limit how much you use to 0-1 drink a day. ? Limit intake if you are breastfeeding.  Be aware of how much alcohol is in your drink. In the [place] one drink equals one 12 oz bottle of beer (355 mL), one 5 oz glass of wine (148 mL), or one 1 oz glass of hard liquor (44 mL). General instructions  Schedule regular health, dental, and eye exams.  Stay current with your vaccines.  Tell your health care provider if: ? You often feel depressed. ? You have ever been abused or do not feel safe at home. Summary  Adopting a healthy lifestyle and getting preventive care are important in promoting health and wellness.  Follow your health care provider's instructions about healthy  diet, exercising, and getting tested or screened for diseases.  Follow your health care provider's instructions on monitoring your cholesterol and blood pressure. This information is not intended to replace advice given to you by your health care provider. Make sure you discuss any questions you have with your health care provider. Document Revised: 06/24/2018 Document Reviewed: 06/24/2018 Elsevier Patient Education  2020 [employer].   Hypertension, Adult Hypertension is another name for high blood pressure. High blood pressure forces your heart to work harder to pump blood. This can cause problems over time. There are two numbers in a blood pressure reading. There is a top number (systolic) over a bottom number (diastolic). It is best to have a blood pressure that is below 120/80. Healthy choices can help lower your blood pressure, or you may need medicine to help lower it. What are the causes? The cause of this condition is not known. Some conditions may be related to high blood pressure. What increases the risk?  Smoking.  Having type  2 diabetes mellitus, high cholesterol, or both.  Not getting enough exercise or physical activity.  Being overweight.  Having too much fat, sugar, calories, or salt (sodium) in your diet.  Drinking too much alcohol.  Having long-term (chronic) kidney disease.  Having a family history of high blood pressure.  Age. Risk increases with age.  Race. You may be at higher risk if you are African American.  Gender. Men are at higher risk than women before age 63. After age 63, women are at higher risk than men.  Having obstructive sleep apnea.  Stress. What are the signs or symptoms?  High blood pressure may not cause symptoms. Very high blood pressure (hypertensive crisis) may cause: ? Headache. ? Feelings of worry or nervousness (anxiety). ? Shortness of breath. ? Nosebleed. ? A feeling of being sick to your stomach (nausea). ? Throwing  up (vomiting). ? Changes in how you see. ? Very bad chest pain. ? Seizures. How is this treated?  This condition is treated by making healthy lifestyle changes, such as: ? Eating healthy foods. ? Exercising more. ? Drinking less alcohol.  Your health care provider may prescribe medicine if lifestyle changes are not enough to get your blood pressure under control, and if: ? Your top number is above 130. ? Your bottom number is above 80.  Your personal target blood pressure may vary. Follow these instructions at home: Eating and drinking   If told, follow the DASH eating plan. To follow this plan: ? Fill one half of your plate at each meal with fruits and vegetables. ? Fill one fourth of your plate at each meal with whole grains. Whole grains include whole-wheat pasta, brown rice, and whole-grain bread. ? Eat or drink low-fat dairy products, such as skim milk or low-fat yogurt. ? Fill one fourth of your plate at each meal with low-fat (lean) proteins. Low-fat proteins include fish, chicken without skin, eggs, beans, and tofu. ? Avoid fatty meat, cured and processed meat, or chicken with skin. ? Avoid pre-made or processed food.  Eat less than 1,500 mg of salt each day.  Do not drink alcohol if: ? Your doctor tells you not to drink. ? You are pregnant, may be pregnant, or are planning to become pregnant.  If you drink alcohol: ? Limit how much you use to:  0-1 drink a day for women.  0-2 drinks a day for men. ? Be aware of how much alcohol is in your drink. In the [place] one drink equals one 12 oz bottle of beer (355 mL), one 5 oz glass of wine (148 mL), or one 1 oz glass of hard liquor (44 mL). Lifestyle   Work with your doctor to stay at a healthy weight or to lose weight. Ask your doctor what the best weight is for you.  Get at least 30 minutes of exercise most days of the week. This may include walking, swimming, or biking.  Get at least 30 minutes of exercise that  strengthens your muscles (resistance exercise) at least 3 days a week. This may include lifting weights or doing Pilates.  Do not use any products that contain nicotine or tobacco, such as cigarettes, e-cigarettes, and chewing tobacco. If you need help quitting, ask your doctor.  Check your blood pressure at home as told by your doctor.  Keep all follow-up visits as told by your doctor. This is important. Medicines  Take over-the-counter and prescription medicines only as told by your doctor. Follow directions carefully.  Do not skip doses of blood pressure medicine. The medicine does not work as well if you skip doses. Skipping doses also puts you at risk for problems.  Ask your doctor about side effects or reactions to medicines that you should watch for. Contact a doctor if you:  Think you are having a reaction to the medicine you are taking.  Have headaches that keep coming back (recurring).  Feel dizzy.  Have swelling in your ankles.  Have trouble with your vision. Get help right away if you:  Get a very bad headache.  Start to feel mixed up (confused).  Feel weak or numb.  Feel faint.  Have very bad pain in your: ? Chest. ? Belly (abdomen).  Throw up more than once.  Have trouble breathing. Summary  Hypertension is another name for high blood pressure.  High blood pressure forces your heart to work harder to pump blood.  For most people, a normal blood pressure is less than 120/80.  Making healthy choices can help lower blood pressure. If your blood pressure does not get lower with healthy choices, you may need to take medicine. This information is not intended to replace advice given to you by your health care provider. Make sure you discuss any questions you have with your health care provider. Document Revised: 03/11/2018 Document Reviewed: 03/11/2018 Elsevier Patient Education  2020 [employer]  Influenza Virus Vaccine (Flucelvax) What is this  medicine? INFLUENZA VIRUS VACCINE (in floo EN zuh VAHY ruhs vak SEEN) helps to reduce the risk of getting influenza also known as the flu. The vaccine only helps protect you against some strains of the flu. This medicine may be used for other purposes; ask your health care provider or pharmacist if you have questions. COMMON BRAND NAME(S): FLUCELVAX What should I tell my health care provider before I take this medicine? They need to know if you have any of these conditions:  bleeding disorder like hemophilia  fever or infection  Guillain-Barre syndrome or other neurological problems  immune system problems  infection with the human immunodeficiency virus (HIV) or AIDS  low blood platelet counts  multiple sclerosis  an unusual or allergic reaction to influenza virus vaccine, other medicines, foods, dyes or preservatives  pregnant or trying to get pregnant  breast-feeding How should I use this medicine? This vaccine is for injection into a muscle. It is given by a health care professional. A copy of Vaccine Information Statements will be given before each vaccination. Read this sheet carefully each time. The sheet may change frequently. Talk to your pediatrician regarding the use of this medicine in children. Special care may be needed. Overdosage: If you think you've taken too much of this medicine contact a poison control center or emergency room at once. Overdosage: If you think you have taken too much of this medicine contact a poison control center or emergency room at once. NOTE: This medicine is only for you. Do not share this medicine with others. What if I miss a dose? This does not apply. What may interact with this medicine?  chemotherapy or radiation therapy  medicines that lower your immune system like etanercept, anakinra, infliximab, and adalimumab  medicines that treat or prevent blood clots like warfarin  phenytoin  steroid medicines like prednisone or  cortisone  theophylline  vaccines This list may not describe all possible interactions. Give your health care provider a list of all the medicines, herbs, non-prescription drugs, or dietary supplements you use. Also tell them  if you smoke, drink alcohol, or use illegal drugs. Some items may interact with your medicine. What should I watch for while using this medicine? Report any side effects that do not go away within 3 days to your doctor or health care professional. Call your health care provider if any unusual symptoms occur within 6 weeks of receiving this vaccine. You may still catch the flu, but the illness is not usually as bad. You cannot get the flu from the vaccine. The vaccine will not protect against colds or other illnesses that may cause fever. The vaccine is needed every year. What side effects may I notice from receiving this medicine? Side effects that you should report to your doctor or health care professional as soon as possible:  allergic reactions like skin rash, itching or hives, swelling of the face, lips, or tongue Side effects that usually do not require medical attention (Report these to your doctor or health care professional if they continue or are bothersome.):  fever  headache  muscle aches and pains  pain, tenderness, redness, or swelling at the injection site  tiredness This list may not describe all possible side effects. Call your doctor for medical advice about side effects. You may report side effects to FDA at 1-800-FDA-1088. Where should I keep my medicine? The vaccine will be given by a health care professional in a clinic, pharmacy, doctor's office, or other health care setting. You will not be given vaccine doses to store at home. NOTE: This sheet is a summary. It may not cover all possible information. If you have questions about this medicine, talk to your doctor, pharmacist, or health care provider.  2020 Elsevier/Gold Standard (2011-06-12  14:06:47)

## 2020-06-29 NOTE — Progress Notes (Signed)
I,Joan [name] as a Neurosurgeon for [employer], FNP.,have documented all relevant documentation on the behalf of Joan Felts, FNP,as directed by  Joan Felts, FNP while in the presence of Joan Felts, FNP. This visit occurred during the SARS-CoV-2 public health emergency.  Safety protocols were in place, including screening questions prior to the visit, additional usage of staff PPE, and extensive cleaning of exam room while observing appropriate contact time as indicated for disinfecting solutions.  Subjective:     Patient ID: Joan [name] , female    DOB: 1956/07/06 , 64 y.o.   MRN: [id number]   Chief Complaint  Patient presents with  . Annual Exam    HPI  Here for hm.  Wt Readings from Last 3 Encounters: 06/29/20 : (!) 339 lb 6.4 oz (154 kg) 04/27/20 : (!) 335 lb (152 kg) 03/21/20 : (!) 336 lb (152.4 kg)  She is currently out of work on [employer] for her knees.  She is going to integrative therapy - Dr. Ave Filter (right shoulder), Dr. Yisroel Ramming (knee/shoulder), Dr. Sherle Poe for her spine.  She is getting dry needling to her spine  She has been seen by Dr. Eldridge Dace in the past. She continues with her blood thinner.     Past Medical History:  Diagnosis Date  . Acid reflux   . Diabetes mellitus without complication (HCC)    Pt states she is prediabetic  . Hypertension   . OSA (obstructive sleep apnea)      Family History  Problem Relation Age of Onset  . Heart disease Mother   . Heart disease Father   . Prostate cancer Father      Current Outpatient Medications:  .  albuterol (VENTOLIN HFA) 108 (90 Base) MCG/ACT inhaler, Inhale 2 puffs into the lungs every 6 (six) hours as needed for wheezing or shortness of breath., Disp: 18 g, Rfl: 1 .  apixaban (ELIQUIS) 5 MG TABS tablet, Take 1 tablet (5 mg total) by mouth 2 (two) times daily., Disp: 60 tablet, Rfl: 11 .  cholecalciferol (VITAMIN D3) 25 MCG (1000 UT) tablet, Take 1,000 Units by mouth daily., Disp: ,  Rfl:  .  CYCLOBENZAPRINE HCL PO, Take 1 tablet by mouth at bedtime as needed., Disp: , Rfl:  .  diclofenac sodium (VOLTAREN) 1 % GEL, Apply 2 g topically 4 (four) times daily., Disp: 100 g, Rfl: 2 .  ezetimibe-simvastatin (VYTORIN) 10-40 MG tablet, Take 1 tablet by mouth daily., Disp: 90 tablet, Rfl: 1 .  Fe Fum-FePoly-FA-Vit C-Vit B3 (INTEGRA F) 125-1 MG CAPS, Take 1 tablet by mouth daily., Disp: 30 capsule, Rfl: 2 .  hydrochlorothiazide (HYDRODIURIL) 25 MG tablet, Take 1 tablet by mouth once daily, Disp: 90 tablet, Rfl: 0 .  Insulin Pen Needle (PEN NEEDLES) 32G X 4 MM MISC, 1 each by Does not apply route daily. Use as directed daily with saxenda, Disp: 100 each, Rfl: 3 .  Liraglutide -Weight Management (SAXENDA) 18 MG/3ML SOPN, Inject 3 mg into the skin daily., Disp: 15 mL, Rfl: 0 .  lisinopril-hydrochlorothiazide (ZESTORETIC) 20-25 MG tablet, Take 1 tablet by mouth once daily, Disp: 90 tablet, Rfl: 0 .  methocarbamol (ROBAXIN) 750 MG tablet, Take 750 mg by mouth 2 (two) times daily as needed for muscle spasms., Disp: , Rfl:  .  Multiple Vitamins-Minerals (MULTIVITAMIN PO), Take 1 tablet by mouth daily., Disp: , Rfl:  .  MYRBETRIQ 50 MG TB24 tablet, Take 50 mg by mouth daily., Disp: , Rfl:  .  omeprazole (PRILOSEC)  40 MG capsule, Take 40 mg by mouth daily., Disp: , Rfl:  .  solifenacin (VESICARE) 10 MG tablet, Take 1 tablet by mouth once daily, Disp: 30 tablet, Rfl: 0 .  ASCORBIC ACID PO, Take 1 tablet by mouth daily. (Patient not taking: No sig reported), Disp: , Rfl:    Allergies  Allergen Reactions  . Penicillins     REACTION: hives      The patient states she uses status post hysterectomy for birth control.  Negative for Dysmenorrhea and Negative for Menorrhagia. Negative for: breast discharge, breast lump(s), breast pain and breast self exam. Associated symptoms include abnormal vaginal bleeding. Pertinent negatives include abnormal bleeding (hematology), anxiety, decreased libido,  depression, difficulty falling sleep, dyspareunia, history of infertility, nocturia, sexual dysfunction, sleep disturbances, urinary incontinence, urinary urgency, vaginal discharge and vaginal itching. Diet regular; reports has been trying to cut down on her eating. Yesterday had hot dog for lunch and beef neck bones, rice and turnip greens. The patient states her exercise level is minimal due to having pain.  States [facility]  The patient's tobacco use is:  Social History   Tobacco Use  Smoking Status Never Smoker  Smokeless Tobacco Never Used   She has been exposed to passive smoke. The patient's alcohol use is:  Social History   Substance and Sexual Activity  Alcohol Use No    Review of Systems  Constitutional: Negative.   HENT: Negative.   Eyes: Negative.   Respiratory: Negative.   Cardiovascular: Negative.   Gastrointestinal: Negative.   Endocrine: Negative.   Genitourinary: Negative.   Musculoskeletal: Positive for arthralgias (bilateral knee pain and right shoulder pain).  Skin: Negative.   Allergic/Immunologic: Negative.   Neurological: Negative.   Hematological: Negative.   Psychiatric/Behavioral: Negative.      Today's Vitals   06/29/20 1544  BP: (!) 168/102  Pulse: 88  Temp: 98.2 F (36.8 C)  TempSrc: Oral  Weight: (!) 339 lb 6.4 oz (154 kg)  Height: 5\' 5"  (1.651 m)  PainSc: 5   PainLoc: Knee   Body mass index is 56.48 kg/m.   Objective:  Physical Exam Vitals reviewed.  Constitutional:      General: She is not in acute distress.    Appearance: Normal appearance. She is well-developed. She is obese.  HENT:     Head: Normocephalic and atraumatic.     Right Ear: Hearing, tympanic membrane, ear canal and external ear normal. There is no impacted cerumen.     Left Ear: Hearing, tympanic membrane, ear canal and external ear normal. There is no impacted cerumen.     Nose:     Comments: Deferred - masked    Mouth/Throat:     Comments:  Deferred - masked Eyes:     General: Lids are normal.     Extraocular Movements: Extraocular movements intact.     Conjunctiva/sclera: Conjunctivae normal.     Pupils: Pupils are equal, round, and reactive to light.     Funduscopic exam:    Right eye: No papilledema.        Left eye: No papilledema.  Neck:     Thyroid: No thyroid mass.     Vascular: No carotid bruit.  Cardiovascular:     Rate and Rhythm: Normal rate and regular rhythm.     Pulses: Normal pulses.     Heart sounds: Normal heart sounds. No murmur heard.   Pulmonary:     Effort: Pulmonary effort is normal.  No respiratory distress.     Breath sounds: Normal breath sounds. No wheezing.  Chest:     Chest wall: No mass.  Breasts:     Tanner Score is 5.     Right: Normal. No mass, tenderness, axillary adenopathy or supraclavicular adenopathy.     Left: Normal. No mass, tenderness, axillary adenopathy or supraclavicular adenopathy.    Abdominal:     General: Abdomen is flat. Bowel sounds are normal. There is no distension.     Palpations: Abdomen is soft.     Tenderness: There is no abdominal tenderness.  Genitourinary:    Rectum: Guaiac result negative.  Musculoskeletal:        General: No swelling or tenderness. Normal range of motion.     Cervical back: Full passive range of motion without pain, normal range of motion and neck supple.     Right lower leg: No edema.     Left lower leg: No edema.  Lymphadenopathy:     Upper Body:     Right upper body: No supraclavicular, axillary or pectoral adenopathy.     Left upper body: No supraclavicular, axillary or pectoral adenopathy.  Skin:    General: Skin is warm and dry.     Capillary Refill: Capillary refill takes less than 2 seconds.  Neurological:     General: No focal deficit present.     Mental Status: She is alert and oriented to person, place, and time.     Cranial Nerves: No cranial nerve deficit.     Sensory: No sensory deficit.  Psychiatric:         Mood and Affect: Mood normal.        Behavior: Behavior normal.        Thought Content: Thought content normal.        Judgment: Judgment normal.         Assessment And Plan:     1. Encounter for general adult medical examination w/o abnormal findings . Behavior modifications discussed and diet history reviewed.   . Pt will continue to exercise regularly and modify diet with low GI, plant based foods and decrease intake of processed foods.  . Recommend intake of daily multivitamin, Vitamin D, and calcium.  . Recommend mammogram and colonoscopy (both are up to date) for preventive screenings, as well as recommend immunizations that include influenza, TDAP  2. Need for influenza vaccination  Influenza vaccine administered  Encouraged to take Tylenol as needed for fever or muscle aches. - Flu Vaccine QUAD 6+ mos PF IM (Fluarix Quad PF)  3. Essential hypertension . B/P is elevated, she admits to forgetting to take her medications regularly by missing doses at times for being busy.  . I have stressed the importance of taking her blood pressure medications regularly and the risk for heart events and kidney damage with elevated blood pressure ad uncontrolled blood pressure. . CMP ordered to check renal function.  . The importance of regular exercise and dietary modification was stressed to the patient.  . Stressed importance of losing ten percent of her body weight to help with B/P control.  . The weight loss would help with decreasing cardiac and cancer risk as well.   4. Mixed hyperlipidemia  Chronic, fair control  no current medications will consider medications pending labs  5. Vitamin D deficiency  Will check vitamin D level and supplement as needed.     Also encouraged to spend 15 minutes in the sun daily.  6. Class 3  severe obesity without serious comorbidity with body mass index (BMI) of 50.0 to 59.9 in adult, unspecified obesity type (HCC)   Chronic, she has tried  [place]Saxenda on multiple occasions and has not been consistent   Discussed healthy diet and regular exercise options   Encouraged to exercise at least 150 minutes per week with 2 days of strength training   Patient was given opportunity to ask questions. Patient verbalized understanding of the plan and was able to repeat key elements of the plan. All questions were answered to their satisfaction.   Jeanell SparrowI, Dinia Joynt, FNP, have reviewed all documentation for this visit. The documentation on 06/29/20 for the exam, diagnosis, procedures, and orders are all accurate and complete.  THE PATIENT IS ENCOURAGED TO PRACTICE SOCIAL DISTANCING DUE TO THE COVID-19 PANDEMIC.

## 2020-06-30 LAB — LIPID PANEL
Chol/HDL Ratio: 3.1 ratio (ref 0.0–4.4)
Cholesterol, Total: 177 mg/dL (ref 100–199)
HDL: 57 mg/dL (ref >39)
LDL Chol Calc (NIH): 105 mg/dL — ABNORMAL HIGH (ref 0–99)
Triglycerides: 78 mg/dL (ref 0–149)
VLDL Cholesterol Cal: 15 mg/dL (ref 5–40)

## 2020-06-30 LAB — VITAMIN D 25 HYDROXY (VIT D DEFICIENCY, FRACTURES): Vit D, 25-Hydroxy: 26.3 ng/mL — ABNORMAL LOW (ref 30.0–100.0)

## 2020-07-03 LAB — CERVICOVAGINAL ANCILLARY ONLY
Bacterial Vaginitis (gardnerella): NEGATIVE
Candida Glabrata: NEGATIVE
Candida Vaginitis: NEGATIVE
Chlamydia: NEGATIVE
Comment: NEGATIVE
Comment: NEGATIVE
Comment: NEGATIVE
Comment: NEGATIVE
Comment: NEGATIVE
Comment: NORMAL
Neisseria Gonorrhea: NEGATIVE
Trichomonas: NEGATIVE

## 2020-07-05 ENCOUNTER — Other Ambulatory Visit: Payer: Self-pay | Admitting: Nurse Practitioner

## 2020-07-05 LAB — URINE CULTURE

## 2020-07-05 MED ORDER — NITROFURANTOIN MONOHYD MACRO 100 MG PO CAPS: 100.0000 mg | ORAL_CAPSULE | Freq: Two times a day (BID) | ORAL | 0 refills | Status: AC

## 2020-07-05 NOTE — Progress Notes (Signed)
Rx for antibiotic sent for UTI

## 2020-07-06 ENCOUNTER — Other Ambulatory Visit: Payer: Self-pay

## 2020-07-06 MED ORDER — VITAMIN D (ERGOCALCIFEROL) 1.25 MG (50000 UNIT) PO CAPS: 50000.0000 [IU] | ORAL_CAPSULE | ORAL | 0 refills | Status: DC

## 2020-07-09 ENCOUNTER — Encounter: Payer: Self-pay | Admitting: Nurse Practitioner

## 2020-08-23 ENCOUNTER — Telehealth: Payer: BC Managed Care – PPO | Admitting: Nurse Practitioner

## 2020-08-23 ENCOUNTER — Telehealth: Payer: Self-pay

## 2020-08-23 NOTE — Telephone Encounter (Signed)
Pt cancelled her appt stated she received some antibiotics from her daughter and she is going to see if they will work if not she will call the office back to reschedule her appt. Provider aware

## 2020-09-06 ENCOUNTER — Ambulatory Visit: Payer: BC Managed Care – PPO | Admitting: Nurse Practitioner

## 2020-09-20 ENCOUNTER — Telehealth: Payer: Self-pay | Admitting: Nurse Practitioner

## 2020-09-20 ENCOUNTER — Telehealth: Payer: Self-pay

## 2020-09-20 NOTE — Telephone Encounter (Signed)
Patient was at "[employer]" and did not log in for virtual visit. Told pt that I would give her 10 mins and call her back to get her visit started. No answer from patient. Patient must keep all scheduled appointments or she will be discharged. similar incident occurred in February.

## 2020-10-20 ENCOUNTER — Telehealth: Payer: Self-pay | Admitting: Hematology and Oncology

## 2020-10-20 NOTE — Telephone Encounter (Signed)
Scheduled per 4/8 staff msg. Called and spoke with pt, confirmed 5/9 appt

## 2020-11-19 NOTE — Progress Notes (Signed)
Patient Care Team: [name], FNP as PCP - General (General Practice)  DIAGNOSIS:    ICD-10-CM   1. Acute pulmonary embolism, unspecified pulmonary embolism type, unspecified whether acute cor pulmonale present (HCC)  I26.99     CHIEF COMPLIANT: Follow-up of pulmonary embolism  INTERVAL HISTORY: Joan [name] is a 65 y.o. with above-mentioned history of pulmonary embolism currently on Eliquis. She presents to the clinic today for follow-up.  She continues to have bilateral lower extremity swellings and pain.  She is unable to work and is in a wheelchair for ambulation.  She is complaining of worsening pain in the legs and we therefore need to perform an ultrasound of her lower extremity.  She continues to be on Eliquis.  ALLERGIES:  is allergic to penicillins.  MEDICATIONS:  Current Outpatient Medications  Medication Sig Dispense Refill  . albuterol (VENTOLIN HFA) 108 (90 Base) MCG/ACT inhaler Inhale 2 puffs into the lungs every 6 (six) hours as needed for wheezing or shortness of breath. 18 g 1  . apixaban (ELIQUIS) 5 MG TABS tablet Take 1 tablet (5 mg total) by mouth 2 (two) times daily. 60 tablet 11  . ASCORBIC ACID PO Take 1 tablet by mouth daily. (Patient not taking: No sig reported)    . cholecalciferol (VITAMIN D3) 25 MCG (1000 UT) tablet Take 1,000 Units by mouth daily.    . CYCLOBENZAPRINE HCL PO Take 1 tablet by mouth at bedtime as needed.    . diclofenac sodium (VOLTAREN) 1 % GEL Apply 2 g topically 4 (four) times daily. 100 g 2  . ezetimibe-simvastatin (VYTORIN) 10-40 MG tablet Take 1 tablet by mouth daily. 90 tablet 1  . Fe Fum-FePoly-FA-Vit C-Vit B3 (INTEGRA F) 125-1 MG CAPS Take 1 tablet by mouth daily. 30 capsule 2  . hydrochlorothiazide (HYDRODIURIL) 25 MG tablet Take 1 tablet by mouth once daily 90 tablet 0  . Insulin Pen Needle (PEN NEEDLES) 32G X 4 MM MISC 1 each by Does not apply route daily. Use as directed daily with saxenda 100 each 3  . Liraglutide  -Weight Management (SAXENDA) 18 MG/3ML SOPN Inject 3 mg into the skin daily. 15 mL 0  . lisinopril-hydrochlorothiazide (ZESTORETIC) 20-25 MG tablet Take 1 tablet by mouth once daily 90 tablet 0  . methocarbamol (ROBAXIN) 750 MG tablet Take 750 mg by mouth 2 (two) times daily as needed for muscle spasms.    . Multiple Vitamins-Minerals (MULTIVITAMIN PO) Take 1 tablet by mouth daily.    Marland Kitchen MYRBETRIQ 50 MG TB24 tablet Take 50 mg by mouth daily.    Marland Kitchen omeprazole (PRILOSEC) 40 MG capsule Take 40 mg by mouth daily.    . solifenacin (VESICARE) 10 MG tablet Take 1 tablet by mouth once daily 30 tablet 0  . Vitamin D, Ergocalciferol, (DRISDOL) 1.25 MG (50000 UNIT) CAPS capsule Take 1 capsule (50,000 Units total) by mouth every 7 (seven) days. 12 capsule 0   No current facility-administered medications for this visit.    PHYSICAL EXAMINATION: ECOG PERFORMANCE STATUS: 1 - Symptomatic but completely ambulatory  Vitals:   11/20/20 1504  BP: (!) 161/83  Pulse: 80  Resp: 17  Temp: (!) 97.5 F (36.4 C)  SpO2: 99%   Filed Weights   11/20/20 1504  Weight: (!) 340 lb 14.4 oz (154.6 kg)     LABORATORY DATA:  I have reviewed the data as listed CMP Latest Ref Rng & Units 04/27/2020 01/26/2020 01/21/2020  Glucose 65 - 99 mg/dL 409(W) 96 119(J)  BUN 8 - 27 mg/dL 20 22 23   Creatinine 0.57 - 1.00 mg/dL 9.21) 1.94(R)  Sodium 134 - 144 mmol/L 143 140 141  Potassium 3.5 - 5.2 mmol/L 4.5 4.0 3.2(L)  Chloride 96 - 106 mmol/L 106 101 104  CO2 20 - 29 mmol/L 25 26 25   Calcium 8.7 - 10.3 mg/dL 9.5 9.8 8.9  Total Protein 6.0 - 8.5 g/dL 6.7 - -  Total Bilirubin 0.0 - 1.2 mg/dL 0.3 - -  Alkaline Phos 44 - 121 IU/L 86 - -  AST 0 - 40 IU/L 15 - -  ALT 0 - 32 IU/L 12 - -    Lab Results  Component Value Date   WBC 5.6 11/20/2020   HGB 10.5 (L) 11/20/2020   HCT 33.1 (L) 11/20/2020   MCV 83.8 11/20/2020   PLT 214 11/20/2020   NEUTROABS 3.6 11/20/2020    ASSESSMENT & PLAN:  Acute pulmonary embolism  (HCC) 01/21/2020:CT angiogram which showed bilateral segmental pulmonary emboli without evidence of right heart strain lower extremity Dopplers which showed an age-indeterminate DVT involving the left femoral vein  Current treatment: Eliquis  Chronic/recurrent blood clots I discussed with the patient risk factors for blood clots.  Inherited risk factors: Came back completely negative Acquired risk factorsinclude: 1. Antiphospholipid antibody syndrome: Negative 2. Obesity 3. Sedentary behavior  Recommendation: Anticoagulation indefinitely given her obesity and her difficulty with ambulation and lower extremity edema  Patient patient has prior history of anemia and iron deficiency. She is currently on oral iron tablets.     11/20/2020: Hemoglobin 10.5 I suspect that the cause of anemia is less likely to be iron deficiency but more likely to be anemia of chronic inflammation Iron studies were reviewed She is applying for permanent disability.  She will discuss this with her primary care physician.  Return to clinic in 1 year with labs and follow-up    No orders of the defined types were placed in this encounter.  The patient has a good understanding of the overall plan. she agrees with it. she will call with any problems that may develop before the next visit here.  Total time spent: 20 mins including face to face time and time spent for planning, charting and coordination of care  03/23/2020, MD, MPH 11/20/2020  I, Sabas Sous Dorshimer, am acting as scribe for Dr. 01/20/2021.  I have reviewed the above documentation for accuracy and completeness, and I agree with the above.

## 2020-11-20 ENCOUNTER — Inpatient Hospital Stay: Payer: BC Managed Care – PPO

## 2020-11-20 ENCOUNTER — Other Ambulatory Visit: Payer: Self-pay

## 2020-11-20 ENCOUNTER — Inpatient Hospital Stay: Payer: BC Managed Care – PPO | Attending: Hematology and Oncology | Admitting: Hematology and Oncology

## 2020-11-20 ENCOUNTER — Telehealth: Payer: Self-pay | Admitting: Hematology and Oncology

## 2020-11-20 VITALS — BP 161/83 | HR 80 | Temp 97.5°F | Resp 17 | Ht 65.0 in | Wt 340.9 lb

## 2020-11-20 DIAGNOSIS — I2699 Other pulmonary embolism without acute cor pulmonale: Secondary | ICD-10-CM | POA: Insufficient documentation

## 2020-11-20 DIAGNOSIS — R609 Edema, unspecified: Secondary | ICD-10-CM | POA: Diagnosis not present

## 2020-11-20 DIAGNOSIS — Z993 Dependence on wheelchair: Secondary | ICD-10-CM | POA: Insufficient documentation

## 2020-11-20 DIAGNOSIS — E669 Obesity, unspecified: Secondary | ICD-10-CM | POA: Diagnosis not present

## 2020-11-20 DIAGNOSIS — D509 Iron deficiency anemia, unspecified: Secondary | ICD-10-CM

## 2020-11-20 DIAGNOSIS — D649 Anemia, unspecified: Secondary | ICD-10-CM | POA: Insufficient documentation

## 2020-11-20 DIAGNOSIS — I82412 Acute embolism and thrombosis of left femoral vein: Secondary | ICD-10-CM | POA: Diagnosis not present

## 2020-11-20 DIAGNOSIS — Z7901 Long term (current) use of anticoagulants: Secondary | ICD-10-CM | POA: Insufficient documentation

## 2020-11-20 LAB — CBC WITH DIFFERENTIAL (CANCER CENTER ONLY)
Abs Immature Granulocytes: 0.01 10*3/uL (ref 0.00–0.07)
Basophils Absolute: 0 10*3/uL (ref 0.0–0.1)
Basophils Relative: 0 %
Eosinophils Absolute: 0.1 10*3/uL (ref 0.0–0.5)
Eosinophils Relative: 2 %
HCT: 33.1 % — ABNORMAL LOW (ref 36.0–46.0)
Hemoglobin: 10.5 g/dL — ABNORMAL LOW (ref 12.0–15.0)
Immature Granulocytes: 0 %
Lymphocytes Relative: 26 %
Lymphs Abs: 1.5 10*3/uL (ref 0.7–4.0)
MCH: 26.6 pg (ref 26.0–34.0)
MCHC: 31.7 g/dL (ref 30.0–36.0)
MCV: 83.8 fL (ref 80.0–100.0)
Monocytes Absolute: 0.5 10*3/uL (ref 0.1–1.0)
Monocytes Relative: 9 %
Neutro Abs: 3.6 10*3/uL (ref 1.7–7.7)
Neutrophils Relative %: 63 %
Platelet Count: 214 10*3/uL (ref 150–400)
RBC: 3.95 MIL/uL (ref 3.87–5.11)
RDW: 13.2 % (ref 11.5–15.5)
WBC Count: 5.6 10*3/uL (ref 4.0–10.5)
nRBC: 0 % (ref 0.0–0.2)

## 2020-11-20 LAB — IRON AND TIBC
Iron: 35 ug/dL — ABNORMAL LOW (ref 41–142)
Saturation Ratios: 12 % — ABNORMAL LOW (ref 21–57)
TIBC: 295 ug/dL (ref 236–444)
UIBC: 260 ug/dL (ref 120–384)

## 2020-11-20 LAB — FERRITIN: Ferritin: 182 ng/mL (ref 11–307)

## 2020-11-20 MED ORDER — TRAMADOL HCL 50 MG PO TABS: 50.0000 mg | ORAL_TABLET | Freq: Four times a day (QID) | ORAL | Status: AC | PRN

## 2020-11-20 MED ORDER — ELIQUIS 5 MG PO TABS: 5.0000 mg | ORAL_TABLET | Freq: Two times a day (BID) | ORAL | 11 refills | Status: AC

## 2020-11-20 NOTE — Telephone Encounter (Signed)
Scheduled per los. Gave avs and calendar  

## 2020-11-20 NOTE — Progress Notes (Signed)
Per MD request, RN placed order for bilateral Vas [place] to r/o DVT.  Orders placed, apt scheduled and pt notified of date and time of apt.

## 2020-11-20 NOTE — Assessment & Plan Note (Signed)
01/21/2020:CT angiogram which showed bilateral segmental pulmonary emboli without evidence of right heart strain lower extremity Dopplers which showed an age-indeterminate DVT involving the left femoral vein  Current treatment: Eliquis  Chronic/recurrent blood clots I discussed with the patient risk factors for blood clots.  Inherited risk factors: Came back completely negative Acquired risk factorsinclude: 1. Antiphospholipid antibody syndrome: Negative 2. Obesity 3. Sedentary behavior  Recommendation: Anticoagulation for 1 year until July 2022  Patient patient has prior history of anemia and iron deficiency. She is currently on oral iron tablets.   Iron saturation 16%, ferritin 208, hemoglobin 10.3, MCV 82.3 04/13/2018: Hemoglobin 10.2 This is her baseline. If the hemoglobin continues to decline below 10 g I might consider doing a bone marrow biopsy in the future.  I suspect that the cause of anemia is less likely to be iron deficiency but more likely to be anemia of chronic inflammation  Return to clinic in 6 months with labs and follow-up

## 2020-11-21 ENCOUNTER — Encounter (HOSPITAL_COMMUNITY): Payer: BC Managed Care – PPO

## 2020-11-24 ENCOUNTER — Other Ambulatory Visit: Payer: Self-pay

## 2020-11-24 ENCOUNTER — Ambulatory Visit (HOSPITAL_COMMUNITY)
Admission: RE | Admit: 2020-11-24 | Discharge: 2020-11-24 | Disposition: A | Discharge status: Home / Self Care | Payer: BC Managed Care – PPO | Source: Ambulatory Visit | Attending: Hematology and Oncology | Admitting: Hematology and Oncology

## 2020-11-24 ENCOUNTER — Ambulatory Visit (HOSPITAL_COMMUNITY): Payer: BC Managed Care – PPO | Attending: Hematology and Oncology

## 2020-11-24 DIAGNOSIS — I2699 Other pulmonary embolism without acute cor pulmonale: Secondary | ICD-10-CM | POA: Insufficient documentation

## 2020-11-24 DIAGNOSIS — I82412 Acute embolism and thrombosis of left femoral vein: Secondary | ICD-10-CM | POA: Diagnosis present

## 2020-11-24 NOTE — Progress Notes (Signed)
BLE venous duplex has been completed.  Preliminary results given to [facility] at Dr. Earmon [place] office.   Results can be found under chart review under CV PROC. 11/24/2020 12:53 PM Aliyanna Wassmer RVT, RDMS

## 2020-11-27 ENCOUNTER — Ambulatory Visit: Payer: Self-pay | Admitting: Nurse Practitioner

## 2020-11-29 ENCOUNTER — Ambulatory Visit (INDEPENDENT_AMBULATORY_CARE_PROVIDER_SITE_OTHER): Payer: BC Managed Care – PPO | Admitting: Nurse Practitioner

## 2020-11-29 ENCOUNTER — Other Ambulatory Visit: Payer: Self-pay

## 2020-11-29 ENCOUNTER — Encounter: Payer: Self-pay | Admitting: Nurse Practitioner

## 2020-11-29 VITALS — BP 144/82 | Temp 98.1°F | Ht 65.0 in | Wt 340.2 lb

## 2020-11-29 DIAGNOSIS — Z6841 Body Mass Index (BMI) 40.0 and over, adult: Secondary | ICD-10-CM

## 2020-11-29 DIAGNOSIS — M25561 Pain in right knee: Secondary | ICD-10-CM | POA: Diagnosis not present

## 2020-11-29 DIAGNOSIS — Z86718 Personal history of other venous thrombosis and embolism: Secondary | ICD-10-CM

## 2020-11-29 DIAGNOSIS — R350 Frequency of micturition: Secondary | ICD-10-CM | POA: Diagnosis not present

## 2020-11-29 DIAGNOSIS — M25562 Pain in left knee: Secondary | ICD-10-CM | POA: Diagnosis not present

## 2020-11-29 DIAGNOSIS — G8929 Other chronic pain: Secondary | ICD-10-CM

## 2020-11-29 DIAGNOSIS — E782 Mixed hyperlipidemia: Secondary | ICD-10-CM

## 2020-11-29 DIAGNOSIS — M25511 Pain in right shoulder: Secondary | ICD-10-CM

## 2020-11-29 DIAGNOSIS — E559 Vitamin D deficiency, unspecified: Secondary | ICD-10-CM

## 2020-11-29 DIAGNOSIS — I10 Essential (primary) hypertension: Secondary | ICD-10-CM

## 2020-11-29 DIAGNOSIS — R7309 Other abnormal glucose: Secondary | ICD-10-CM

## 2020-11-29 LAB — POCT URINALYSIS DIPSTICK
Bilirubin, UA: NEGATIVE
Blood, UA: NEGATIVE
Glucose, UA: NEGATIVE
Ketones, UA: NEGATIVE
Leukocytes, UA: NEGATIVE
Nitrite, UA: NEGATIVE
Protein, UA: POSITIVE — AB
Spec Grav, UA: 1.02 (ref 1.010–1.025)
Urobilinogen, UA: 0.2 E.U./dL
pH, UA: 6.5 (ref 5.0–8.0)

## 2020-11-29 MED ORDER — HYDROCHLOROTHIAZIDE 25 MG PO TABS: 1.0000 | ORAL_TABLET | Freq: Every day | ORAL | 1 refills | Status: DC

## 2020-11-29 MED ORDER — LISINOPRIL-HYDROCHLOROTHIAZIDE 20-25 MG PO TABS: 1.0000 | ORAL_TABLET | Freq: Every day | ORAL | 1 refills | Status: DC

## 2020-11-29 NOTE — Progress Notes (Signed)
This visit occurred during the SARS-CoV-2 public health emergency.  Safety protocols were in place, including screening questions prior to the visit, additional usage of staff PPE, and extensive cleaning of exam room while observing appropriate contact time as indicated for disinfecting solutions.  Subjective:     Patient ID: Joan [name] , female    DOB: 1956/06/30 , 65 y.o.   MRN: [id number]   Chief Complaint  Patient presents with  . Dysuria  . Hypertension    HPI  She reports she has been taking myrbetriq from the urologist. She reports she had been on vesicare in the past. She has an appt with Urology May 23rd with Dr. Louis Meckel at 3p. She reports she is having to wear to depends due to inability to hold her urine.   Hematology is keeping her on the blood thinner for one year due to being high risk.   She is taking tramadol 2-3 times a day due to pain. She is unable to use her right shoulder as well. She has bilateral knee pain and reports spinal arthritis. She is going to [facility] for her pain management. She reports she is not able to stand for long periods due to having spasms. She is unable to lift her right shoulder - she reports she has a torn rotator cuff. She is currently going to water therapy - with breakthrough. She is also going to integrative therapies for needling for her spine.   She is being followed by Dr. Loma Sender. - ordered water therapy. Dr. Latanya Maudlin ordered the regular PT.   She is not currently exercising and is trying to not eat as much starch.    She is trying to apply for permanent disability.    Hypertension This is a chronic problem. The current episode started more than 1 year ago. The problem has been gradually improving since onset. The problem is uncontrolled. Pertinent negatives include no anxiety or blurred vision. There are no associated agents to hypertension. Risk factors for coronary artery disease include sedentary lifestyle and  obesity. There are no compliance problems.  There is no history of angina or kidney disease. There is no history of chronic renal disease.     Past Medical History:  Diagnosis Date  . Acid reflux   . Diabetes mellitus without complication ([place])    Pt states she is prediabetic  . Hypertension   . OSA (obstructive sleep apnea)      Family History  Problem Relation Age of Onset  . Heart disease Mother   . Heart disease Father   . Prostate cancer Father      Current Outpatient Medications:  .  albuterol (VENTOLIN HFA) 108 (90 Base) MCG/ACT inhaler, Inhale 2 puffs into the lungs every 6 (six) hours as needed for wheezing or shortness of breath., Disp: 18 g, Rfl: 1 .  apixaban (ELIQUIS) 5 MG TABS tablet, Take 1 tablet (5 mg total) by mouth 2 (two) times daily., Disp: 60 tablet, Rfl: 11 .  CYCLOBENZAPRINE HCL PO, Take 1 tablet by mouth at bedtime as needed., Disp: , Rfl:  .  diclofenac sodium (VOLTAREN) 1 % GEL, Apply 2 g topically 4 (four) times daily., Disp: 100 g, Rfl: 2 .  ezetimibe-simvastatin (VYTORIN) 10-40 MG tablet, Take 1 tablet by mouth daily., Disp: 90 tablet, Rfl: 1 .  Fe Fum-FePoly-FA-Vit C-Vit B3 (INTEGRA F) 125-1 MG CAPS, Take 1 tablet by mouth daily., Disp: 30 capsule, Rfl: 2 .  Insulin Pen Needle (  PEN NEEDLES) 32G X 4 MM MISC, 1 each by Does not apply route daily. Use as directed daily with saxenda, Disp: 100 each, Rfl: 3 .  Liraglutide -Weight Management (SAXENDA) 18 MG/3ML SOPN, Inject 3 mg into the skin daily., Disp: 15 mL, Rfl: 0 .  methocarbamol (ROBAXIN) 750 MG tablet, Take 750 mg by mouth 2 (two) times daily as needed for muscle spasms., Disp: , Rfl:  .  Multiple Vitamins-Minerals (MULTIVITAMIN PO), Take 1 tablet by mouth daily., Disp: , Rfl:  .  MYRBETRIQ 50 MG TB24 tablet, Take 50 mg by mouth daily., Disp: , Rfl:  .  omeprazole (PRILOSEC) 40 MG capsule, Take 40 mg by mouth daily., Disp: , Rfl:  .  solifenacin (VESICARE) 10 MG tablet, Take 1 tablet by mouth once  daily, Disp: 30 tablet, Rfl: 0 .  traMADol (ULTRAM) 50 MG tablet, Take 1 tablet (50 mg total) by mouth every 6 (six) hours as needed., Disp: 30 tablet, Rfl:  .  Vitamin D, Ergocalciferol, (DRISDOL) 1.25 MG (50000 UNIT) CAPS capsule, Take 1 capsule (50,000 Units total) by mouth every 7 (seven) days., Disp: 12 capsule, Rfl: 0 .  ASCORBIC ACID PO, Take 1 tablet by mouth daily. (Patient not taking: No sig reported), Disp: , Rfl:  .  cholecalciferol (VITAMIN D3) 25 MCG (1000 UT) tablet, Take 1,000 Units by mouth daily., Disp: , Rfl:  .  hydrochlorothiazide (HYDRODIURIL) 25 MG tablet, Take 1 tablet (25 mg total) by mouth daily., Disp: 90 tablet, Rfl: 1 .  lisinopril-hydrochlorothiazide (ZESTORETIC) 20-25 MG tablet, Take 1 tablet by mouth daily., Disp: 90 tablet, Rfl: 1   Allergies  Allergen Reactions  . Penicillins     REACTION: hives     Review of Systems  Constitutional: Negative.   Eyes: Negative for blurred vision.  Respiratory: Negative.   Cardiovascular: Negative.   Musculoskeletal: Positive for arthralgias, back pain and myalgias.  Psychiatric/Behavioral: Negative.      Today's Vitals   11/29/20 1603  BP: (!) 144/82  Temp: 98.1 F (36.7 C)  TempSrc: Oral  Weight: (!) 340 lb 3.2 oz (154.3 kg)  Height: _0  (1.651 m)  PainSc: 5   PainLoc: Back   Body mass index is 56.61 kg/m.   Objective:  Physical Exam Vitals reviewed.  Constitutional:      General: She is not in acute distress.    Appearance: Normal appearance. She is obese.     Comments: Super mordidly obese  Cardiovascular:     Rate and Rhythm: Normal rate and regular rhythm.     Pulses: Normal pulses.     Heart sounds: Normal heart sounds. No murmur heard.   Pulmonary:     Effort: Pulmonary effort is normal. No respiratory distress.     Breath sounds: Normal breath sounds. No wheezing.  Musculoskeletal:        General: Tenderness present. No swelling or deformity.     Comments: She is using a cane today   Neurological:     General: No focal deficit present.     Mental Status: She is alert and oriented to person, place, and time.     Cranial Nerves: No cranial nerve deficit.     Motor: No weakness.  Psychiatric:        Mood and Affect: Mood normal.        Behavior: Behavior normal.        Thought Content: Thought content normal.        Judgment: Judgment normal.  Assessment And Plan:     1. Urinary frequency  She is having more incontinence and going more frequently.   She is scheduled to see the Urologist later this month. She does not appear to have had her vesicare filled since January 2021. I have advised her to ask the urologist if she should take both medications together. Urinalysis is normal except for protein.  - POCT Urinalysis Dipstick (81002)  2. Chronic right shoulder pain  This has been ongoing since 2018 she reports she has a tendon tear but is not interested in having surgery.  She had been seeing Dr. Matt Holmes but he has referred her back to the PCP. She is interested in long term disability.   3. Chronic pain of both knees  She has been followed by Orthopedics but has since been released back to PCP, I am awaiting the records from them  I do feel if she focuses on weight loss this will help improve her knee pain - Amb Ref to Medical Weight Management  4. Essential hypertension  Chronic, fairly stable with slight elevation today  She is to continue with her current medications - lisinopril-hydrochlorothiazide (ZESTORETIC) 20-25 MG tablet; Take 1 tablet by mouth daily.  Dispense: 90 tablet; Refill: 1 - hydrochlorothiazide (HYDRODIURIL) 25 MG tablet; Take 1 tablet (25 mg total) by mouth daily.  Dispense: 90 tablet; Refill: 1 - CMP14+EGFR - CBC  5. Mixed hyperlipidemia  Chronic, stable  Continue with current medications  6. Vitamin D deficiency  Will check vitamin D level and supplement as needed.     Also encouraged to spend 15 minutes in the  sun daily.  - VITAMIN D 25 Hydroxy (Vit-D Deficiency, Fractures)  7. Abnormal glucose  Chronic, stable  No current medications - Hemoglobin A1c  8. Class 3 severe obesity due to excess calories without serious comorbidity with body mass index (BMI) of 50.0 to 59.9 in adult [facility])  Chronic  Discussed healthy diet and regular exercise options   Encouraged to exercise at least 150 minutes per week with 2 days of strength training  She is not currently on any medications for weight loss, she has taken saxenda in the past but has been quite some time  I will refer to weight management for evaluation for possible gastric bypass I feel like her weight is affecting her blood pressure and her joints with arthritis.   9. History of DVT (deep vein thrombosis) Continue with Eliquis as ordered by Hematology   Patient was given opportunity to ask questions. Patient verbalized understanding of the plan and was able to repeat key elements of the plan. All questions were answered to their satisfaction.  Minette Brine, FNP   I, Minette Brine, FNP, have reviewed all documentation for this visit. The documentation on 11/29/20 for the exam, diagnosis, procedures, and orders are all accurate and complete.   IF YOU HAVE BEEN REFERRED TO A SPECIALIST, IT MAY TAKE 1-2 WEEKS TO SCHEDULE/PROCESS THE REFERRAL. IF YOU HAVE NOT HEARD FROM US/SPECIALIST IN TWO WEEKS, PLEASE GIVE [place] A CALL AT [phone].   THE PATIENT IS ENCOURAGED TO PRACTICE SOCIAL DISTANCING DUE TO THE COVID-19 PANDEMIC.

## 2020-11-30 LAB — HEMOGLOBIN A1C
Est. average glucose Bld gHb Est-mCnc: 123 mg/dL
Hgb A1c MFr Bld: 5.9 % — ABNORMAL HIGH (ref 4.8–5.6)

## 2020-11-30 LAB — CMP14+EGFR
ALT: 13 IU/L (ref 0–32)
AST: 14 IU/L (ref 0–40)
Albumin/Globulin Ratio: 1.5 (ref 1.2–2.2)
Albumin: 4.1 g/dL (ref 3.8–4.8)
Alkaline Phosphatase: 97 IU/L (ref 44–121)
BUN/Creatinine Ratio: 19 (ref 12–28)
BUN: 19 mg/dL (ref 8–27)
Bilirubin Total: 0.2 mg/dL (ref 0.0–1.2)
CO2: 24 mmol/L (ref 20–29)
Calcium: 9.6 mg/dL (ref 8.7–10.3)
Chloride: 106 mmol/L (ref 96–106)
Creatinine, Ser: 0.99 mg/dL (ref 0.57–1.00)
Globulin, Total: 2.8 g/dL (ref 1.5–4.5)
Glucose: 100 mg/dL — ABNORMAL HIGH (ref 65–99)
Potassium: 4.1 mmol/L (ref 3.5–5.2)
Sodium: 143 mmol/L (ref 134–144)
Total Protein: 6.9 g/dL (ref 6.0–8.5)
eGFR: 64 mL/min/{1.73_m2} (ref >59)

## 2020-11-30 LAB — VITAMIN D 25 HYDROXY (VIT D DEFICIENCY, FRACTURES): Vit D, 25-Hydroxy: 27.8 ng/mL — ABNORMAL LOW (ref 30.0–100.0)

## 2020-11-30 LAB — CBC
Hematocrit: 34 % (ref 34.0–46.6)
Hemoglobin: 10.8 g/dL — ABNORMAL LOW (ref 11.1–15.9)
MCH: 26.2 pg — ABNORMAL LOW (ref 26.6–33.0)
MCHC: 31.8 g/dL (ref 31.5–35.7)
MCV: 83 fL (ref 79–97)
Platelets: 246 10*3/uL (ref 150–450)
RBC: 4.12 x10E6/uL (ref 3.77–5.28)
RDW: 12.6 % (ref 11.7–15.4)
WBC: 5.7 10*3/uL (ref 3.4–10.8)

## 2020-12-06 ENCOUNTER — Encounter: Payer: Self-pay | Admitting: Nurse Practitioner

## 2020-12-07 ENCOUNTER — Other Ambulatory Visit: Payer: Self-pay

## 2020-12-07 DIAGNOSIS — D509 Iron deficiency anemia, unspecified: Secondary | ICD-10-CM

## 2020-12-07 MED ORDER — INTEGRA F 125-1 MG PO CAPS: 1.0000 | ORAL_CAPSULE | Freq: Every day | ORAL | 2 refills | Status: AC

## 2020-12-07 MED ORDER — VITAMIN D (ERGOCALCIFEROL) 1.25 MG (50000 UNIT) PO CAPS: 50000.0000 [IU] | ORAL_CAPSULE | ORAL | 0 refills | Status: DC

## 2020-12-19 DIAGNOSIS — Z0271 Encounter for disability determination: Secondary | ICD-10-CM

## 2021-01-02 ENCOUNTER — Other Ambulatory Visit: Payer: Self-pay

## 2021-01-02 ENCOUNTER — Ambulatory Visit
Admission: RE | Admit: 2021-01-02 | Discharge: 2021-01-02 | Disposition: A | Discharge status: Home / Self Care | Payer: BC Managed Care – PPO | Source: Ambulatory Visit | Attending: Nurse Practitioner | Admitting: Nurse Practitioner

## 2021-01-02 ENCOUNTER — Other Ambulatory Visit: Payer: Self-pay | Admitting: Nurse Practitioner

## 2021-01-02 DIAGNOSIS — Z1231 Encounter for screening mammogram for malignant neoplasm of breast: Secondary | ICD-10-CM

## 2021-02-07 ENCOUNTER — Encounter: Payer: Self-pay | Admitting: Nurse Practitioner

## 2021-02-13 ENCOUNTER — Ambulatory Visit: Payer: BC Managed Care – PPO | Admitting: Nurse Practitioner

## 2021-02-14 ENCOUNTER — Telehealth: Payer: Self-pay

## 2021-02-14 NOTE — Telephone Encounter (Signed)
Called patient to ask about form rcvd from a PCP in [place] requesting records. PT CONFIRMED THAT SHE IS TRANSFERRING CARE.

## 2021-03-14 IMAGING — CR DG CHEST 2V
2 series · 2 of 2 positions shown · non-contrast
Comparison: September 15, 2009

CLINICAL DATA: Chest pain.  Shortness of breath.

EXAM:
CHEST - 2 VIEW

[w chest pa]
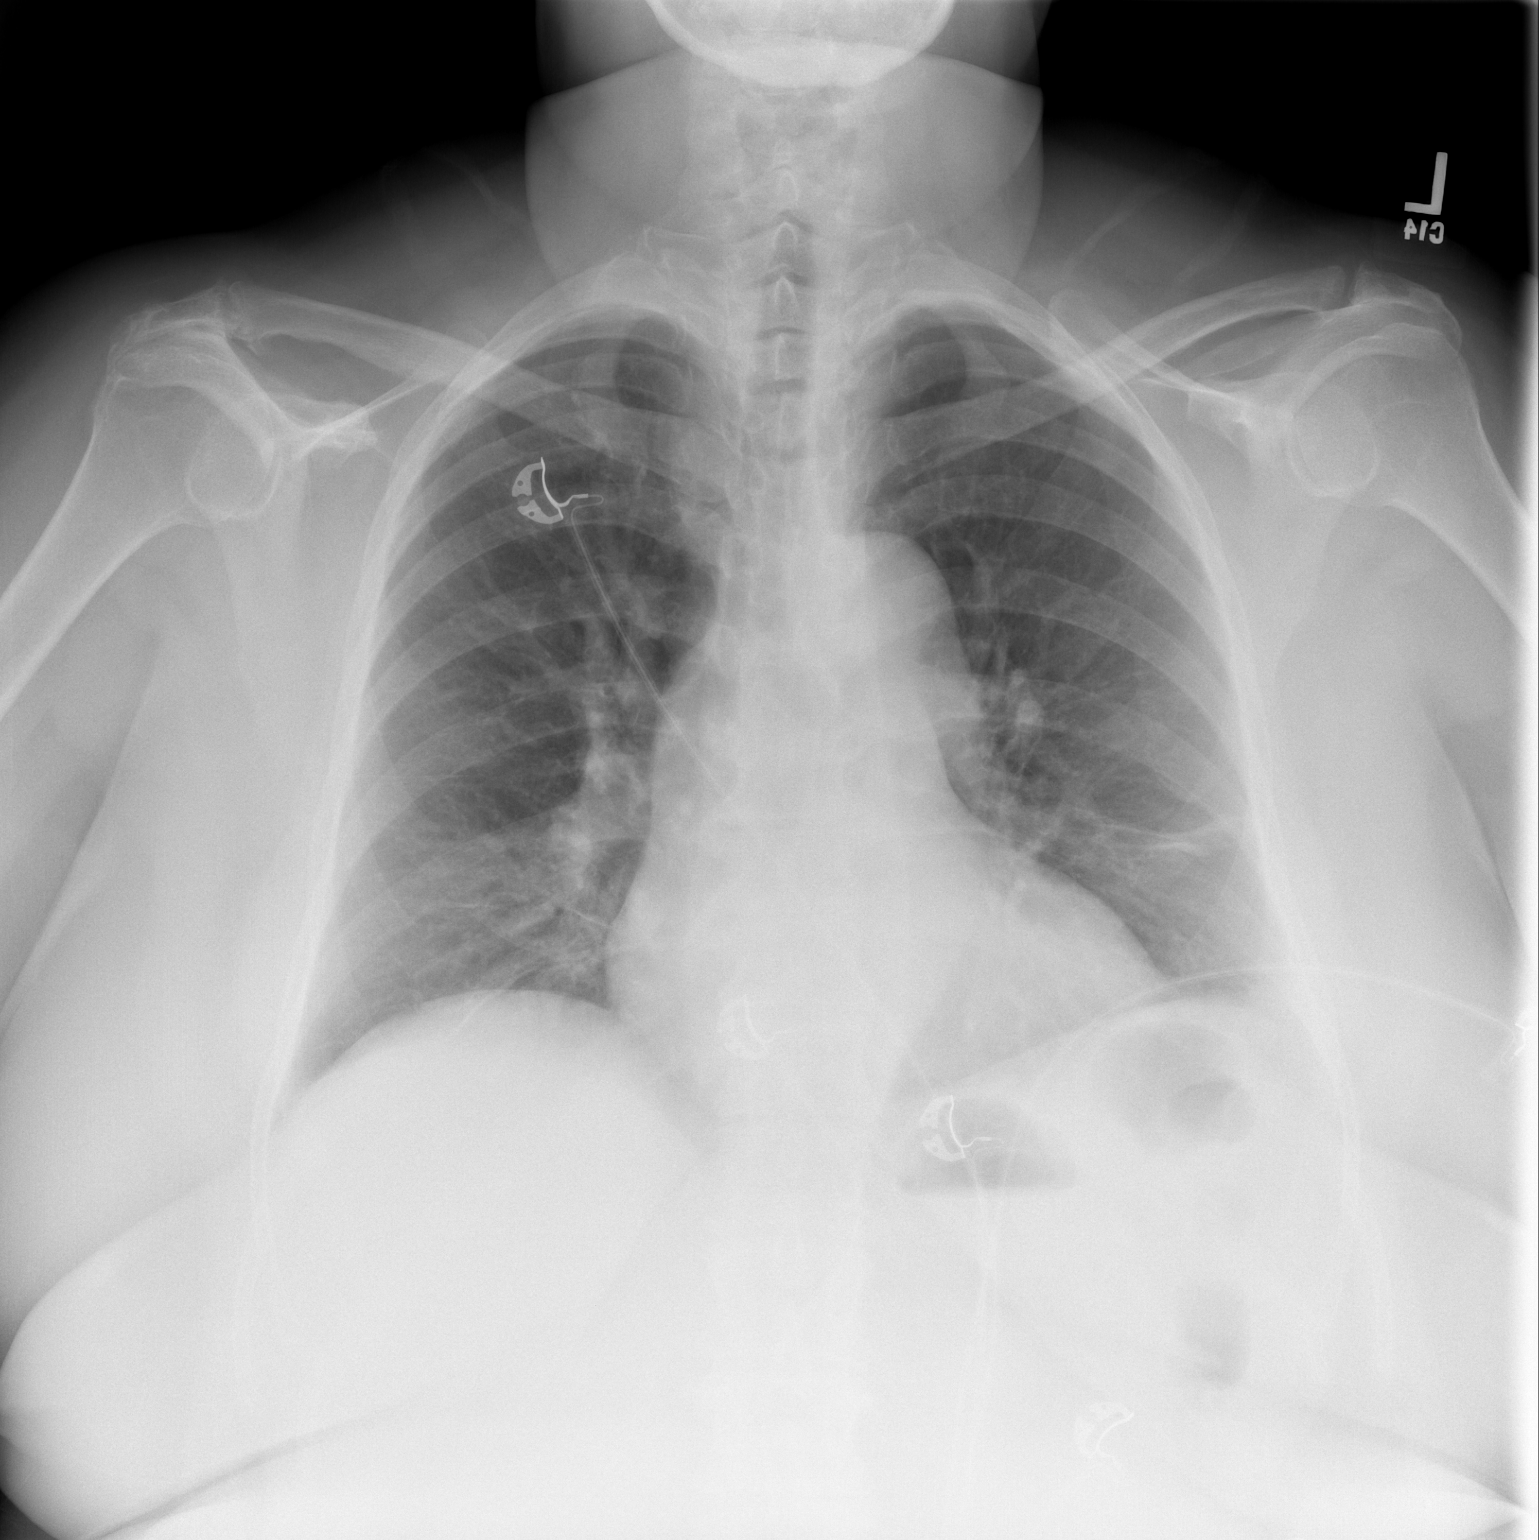

[w chest lat]
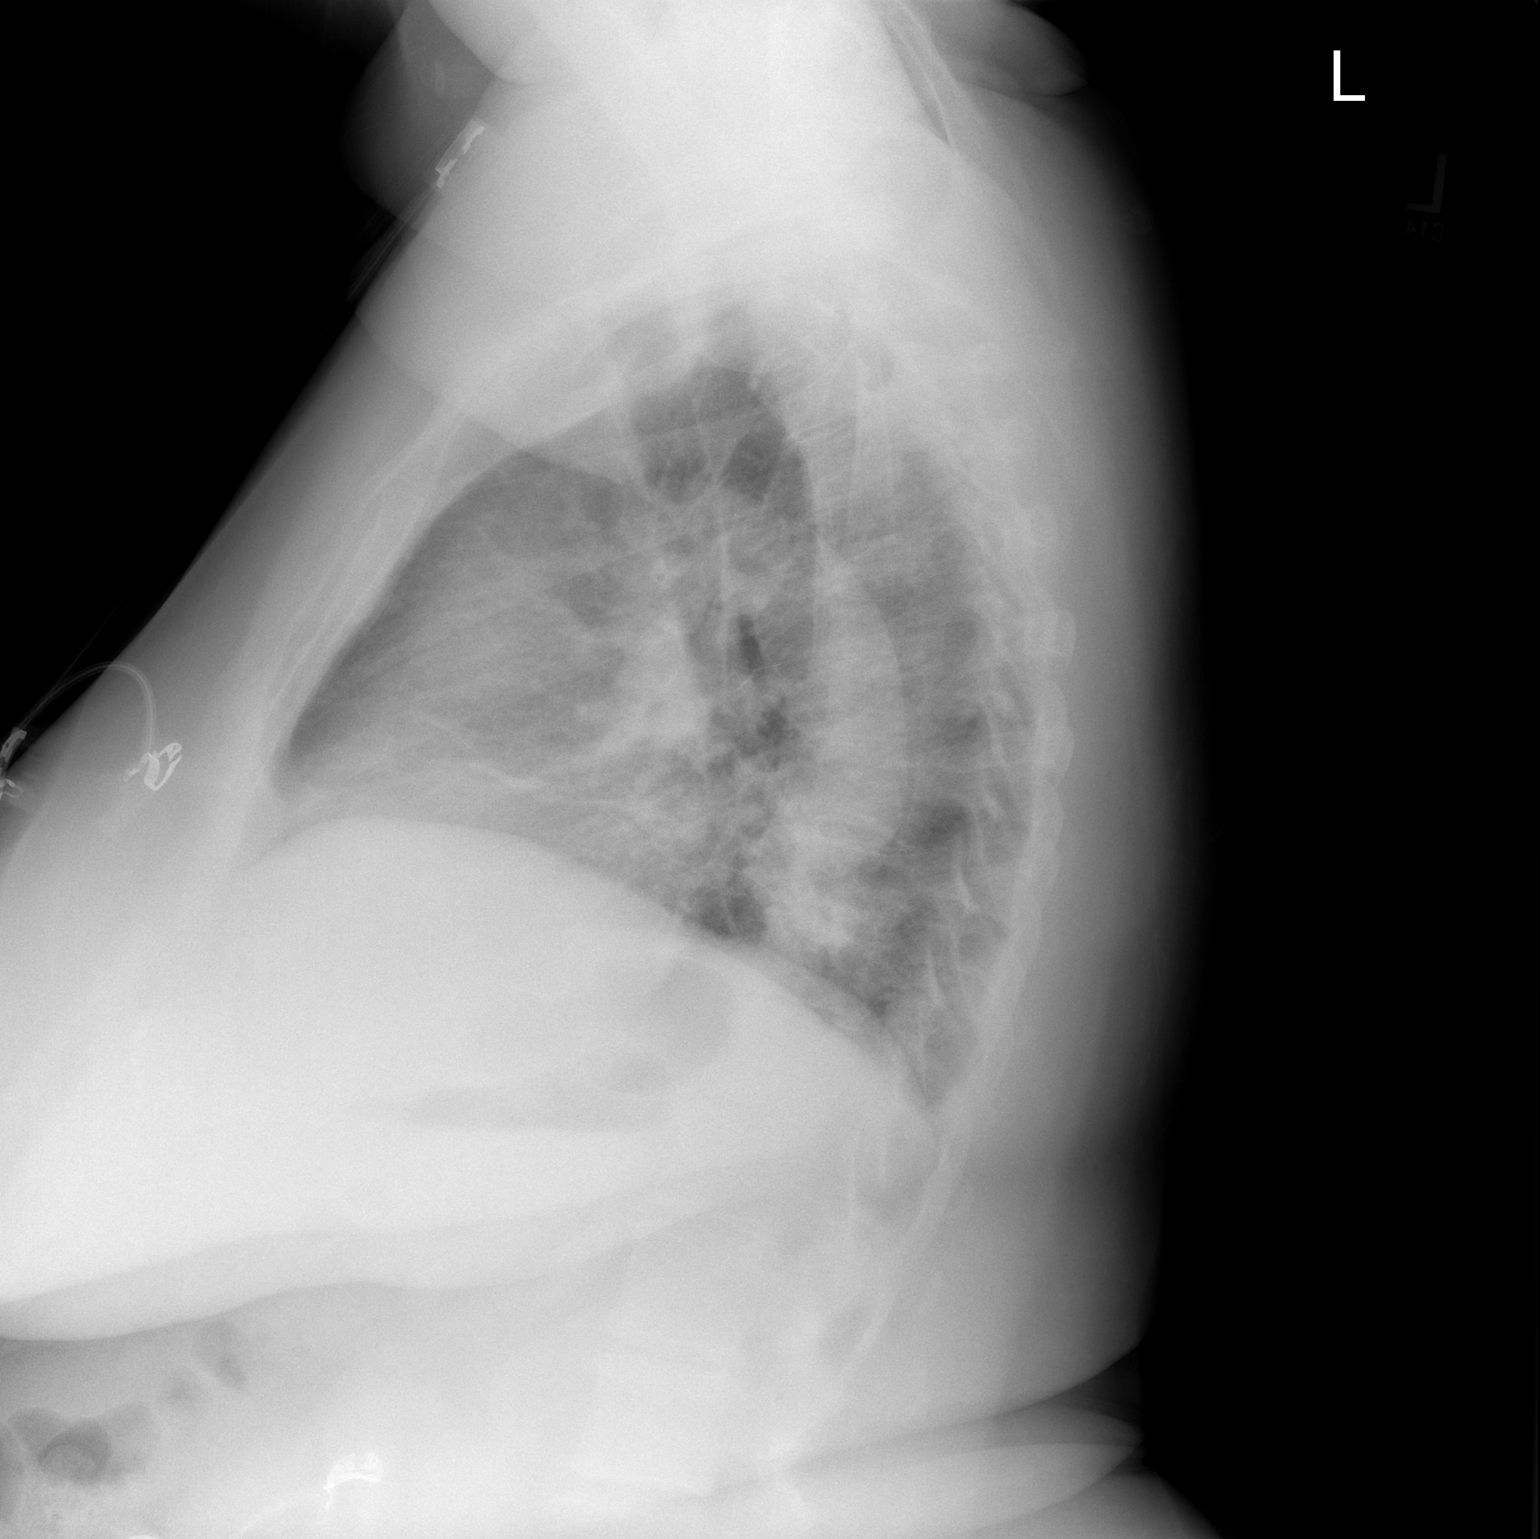

[2 of 2 positions shown; findings below may reference images not displayed]

FINDINGS: The heart size is mildly enlarged. The hila and mediastinum are
normal. Platelike opacity in left base was not seen previously. No
pneumothorax. No nodules or masses. No other infiltrates identified.
IMPRESSION: Streaky opacity in left base is probably atelectasis or scar. No
other acute abnormalities.

## 2021-05-29 ENCOUNTER — Other Ambulatory Visit: Payer: Self-pay

## 2021-05-29 ENCOUNTER — Encounter (HOSPITAL_BASED_OUTPATIENT_CLINIC_OR_DEPARTMENT_OTHER): Payer: Self-pay | Admitting: Emergency Medicine

## 2021-05-29 ENCOUNTER — Emergency Department (HOSPITAL_BASED_OUTPATIENT_CLINIC_OR_DEPARTMENT_OTHER)
Admission: EM | Admit: 2021-05-29 | Discharge: 2021-05-30 | Disposition: A | Discharge status: Home / Self Care | Payer: BC Managed Care – PPO | Attending: Emergency Medicine | Admitting: Emergency Medicine

## 2021-05-29 DIAGNOSIS — M542 Cervicalgia: Secondary | ICD-10-CM | POA: Diagnosis present

## 2021-05-29 DIAGNOSIS — Z79899 Other long term (current) drug therapy: Secondary | ICD-10-CM | POA: Insufficient documentation

## 2021-05-29 DIAGNOSIS — Z794 Long term (current) use of insulin: Secondary | ICD-10-CM | POA: Diagnosis not present

## 2021-05-29 DIAGNOSIS — Z7901 Long term (current) use of anticoagulants: Secondary | ICD-10-CM | POA: Diagnosis not present

## 2021-05-29 DIAGNOSIS — M75102 Unspecified rotator cuff tear or rupture of left shoulder, not specified as traumatic: Secondary | ICD-10-CM | POA: Insufficient documentation

## 2021-05-29 DIAGNOSIS — Y92410 Unspecified street and highway as the place of occurrence of the external cause: Secondary | ICD-10-CM | POA: Diagnosis not present

## 2021-05-29 DIAGNOSIS — M1990 Unspecified osteoarthritis, unspecified site: Secondary | ICD-10-CM | POA: Diagnosis not present

## 2021-05-29 DIAGNOSIS — R519 Headache, unspecified: Secondary | ICD-10-CM | POA: Diagnosis not present

## 2021-05-29 DIAGNOSIS — E119 Type 2 diabetes mellitus without complications: Secondary | ICD-10-CM | POA: Diagnosis not present

## 2021-05-29 DIAGNOSIS — I10 Essential (primary) hypertension: Secondary | ICD-10-CM | POA: Diagnosis not present

## 2021-05-29 DIAGNOSIS — M5030 Other cervical disc degeneration, unspecified cervical region: Secondary | ICD-10-CM | POA: Diagnosis not present

## 2021-05-29 DIAGNOSIS — M12819 Other specific arthropathies, not elsewhere classified, unspecified shoulder: Secondary | ICD-10-CM
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
## 2021-05-30 ENCOUNTER — Encounter (HOSPITAL_BASED_OUTPATIENT_CLINIC_OR_DEPARTMENT_OTHER): Payer: Self-pay | Admitting: Emergency Medicine

## 2021-05-30 ENCOUNTER — Emergency Department (HOSPITAL_BASED_OUTPATIENT_CLINIC_OR_DEPARTMENT_OTHER): Payer: BC Managed Care – PPO

## 2021-05-30 MED ORDER — LIDOCAINE 5 % EX PTCH: 1.0000 | MEDICATED_PATCH | CUTANEOUS | 0 refills | Status: DC

## 2021-05-30 MED ORDER — METHOCARBAMOL 500 MG PO TABS
1000.0000 mg | ORAL_TABLET | Freq: Once | ORAL | Status: AC
  Administered 2021-05-30: 1000 mg via ORAL
  Filled 2021-05-30: qty 2

## 2021-05-30 MED ORDER — METHOCARBAMOL 500 MG PO TABS: 500.0000 mg | ORAL_TABLET | Freq: Two times a day (BID) | ORAL | 0 refills | Status: DC

## 2021-05-30 MED ORDER — LIDOCAINE 5 % EX PTCH
3.0000 | MEDICATED_PATCH | CUTANEOUS | Status: DC
Start: 2021-05-30 — End: 2021-05-30
  Administered 2021-05-30: 3 via TRANSDERMAL
  Filled 2021-05-30: qty 3

## 2021-05-30 MED ORDER — PREDNISONE 20 MG PO TABS: ORAL_TABLET | ORAL | 0 refills | Status: DC

## 2021-05-30 MED ORDER — NAPROXEN 250 MG PO TABS
500.0000 mg | ORAL_TABLET | Freq: Once | ORAL | Status: DC
  Filled 2021-05-30: qty 2
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
## 2021-07-05 ENCOUNTER — Encounter: Payer: BC Managed Care – PPO | Admitting: Nurse Practitioner

## 2021-07-23 ENCOUNTER — Other Ambulatory Visit: Payer: Self-pay | Admitting: Pain Medicine

## 2021-07-23 DIAGNOSIS — R29898 Other symptoms and signs involving the musculoskeletal system: Secondary | ICD-10-CM

## 2021-07-23 DIAGNOSIS — M545 Low back pain, unspecified: Secondary | ICD-10-CM

## 2021-08-11 ENCOUNTER — Other Ambulatory Visit: Payer: Self-pay

## 2021-08-11 ENCOUNTER — Ambulatory Visit
Admission: RE | Admit: 2021-08-11 | Discharge: 2021-08-11 | Disposition: A | Discharge status: Home / Self Care | Payer: BC Managed Care – PPO | Source: Ambulatory Visit | Attending: Pain Medicine | Admitting: Pain Medicine

## 2021-08-11 DIAGNOSIS — M545 Low back pain, unspecified: Secondary | ICD-10-CM

## 2021-08-11 DIAGNOSIS — R29898 Other symptoms and signs involving the musculoskeletal system: Secondary | ICD-10-CM

## 2021-09-17 ENCOUNTER — Other Ambulatory Visit: Payer: Self-pay | Admitting: Nurse Practitioner

## 2021-09-17 DIAGNOSIS — E782 Mixed hyperlipidemia: Secondary | ICD-10-CM

## 2021-11-02 NOTE — Progress Notes (Incomplete)
? ?Patient Care Team: ?Pcp, No as PCP - General ? ?DIAGNOSIS: No diagnosis found. ? ?SUMMARY OF ONCOLOGIC HISTORY: ?Oncology History  ? No history exists.  ? ? ?CHIEF COMPLIANT:Follow-up of pulmonary embolism  ? ?INTERVAL HISTORY: Joan [name] is a  66 y.o. with above-mentioned history of pulmonary embolism currently on Eliquis. She presents to the clinic today for follow-up. ? ? ?ALLERGIES:  is allergic to penicillins. ? ?MEDICATIONS:  ?Current Outpatient Medications  ?Medication Sig Dispense Refill  ? albuterol (VENTOLIN HFA) 108 (90 Base) MCG/ACT inhaler Inhale 2 puffs into the lungs every 6 (six) hours as needed for wheezing or shortness of breath. 18 g 1  ? apixaban (ELIQUIS) 5 MG TABS tablet Take 1 tablet (5 mg total) by mouth 2 (two) times daily. 60 tablet 11  ? ASCORBIC ACID PO Take 1 tablet by mouth daily. (Patient not taking: No sig reported)    ? cholecalciferol (VITAMIN D3) 25 MCG (1000 UT) tablet Take 1,000 Units by mouth daily.    ? CYCLOBENZAPRINE HCL PO Take 1 tablet by mouth at bedtime as needed.    ? diclofenac sodium (VOLTAREN) 1 % GEL Apply 2 g topically 4 (four) times daily. 100 g 2  ? ezetimibe-simvastatin (VYTORIN) 10-40 MG tablet Take 1 tablet by mouth daily. 90 tablet 1  ? Fe Fum-FePoly-FA-Vit C-Vit B3 (INTEGRA F) 125-1 MG CAPS Take 1 tablet by mouth daily. 30 capsule 2  ? hydrochlorothiazide (HYDRODIURIL) 25 MG tablet Take 1 tablet (25 mg total) by mouth daily. 90 tablet 1  ? Insulin Pen Needle (PEN NEEDLES) 32G X 4 MM MISC 1 each by Does not apply route daily. Use as directed daily with saxenda 100 each 3  ? lidocaine (LIDODERM) 5 % Place 1 patch onto the skin daily. Remove & Discard patch within 12 hours or as directed by MD 30 patch 0  ? Liraglutide -Weight Management (SAXENDA) 18 MG/3ML SOPN Inject 3 mg into the skin daily. 15 mL 0  ? lisinopril-hydrochlorothiazide (ZESTORETIC) 20-25 MG tablet Take 1 tablet by mouth daily. 90 tablet 1  ? methocarbamol (ROBAXIN) 500 MG tablet Take  1 tablet (500 mg total) by mouth 2 (two) times daily. 20 tablet 0  ? methocarbamol (ROBAXIN) 750 MG tablet Take 750 mg by mouth 2 (two) times daily as needed for muscle spasms.    ? Multiple Vitamins-Minerals (MULTIVITAMIN PO) Take 1 tablet by mouth daily.    ? MYRBETRIQ 50 MG TB24 tablet Take 50 mg by mouth daily.    ? omeprazole (PRILOSEC) 40 MG capsule Take 40 mg by mouth daily.    ? predniSONE (DELTASONE) 20 MG tablet 2 tabs po daily x 3 days 6 tablet 0  ? solifenacin (VESICARE) 10 MG tablet Take 1 tablet by mouth once daily 30 tablet 0  ? traMADol (ULTRAM) 50 MG tablet Take 1 tablet (50 mg total) by mouth every 6 (six) hours as needed. 30 tablet   ? Vitamin D, Ergocalciferol, (DRISDOL) 1.25 MG (50000 UNIT) CAPS capsule Take 1 capsule (50,000 Units total) by mouth every 7 (seven) days. 12 capsule 0  ? ?No current facility-administered medications for this visit.  ? ? ?PHYSICAL EXAMINATION: ?ECOG PERFORMANCE STATUS: {CHL ONC ECOG [id number] ? ?There were no vitals filed for this visit. ?There were no vitals filed for this visit. ? ?BREAST:*** No palpable masses or nodules in either right or left breasts. No palpable axillary supraclavicular or infraclavicular adenopathy no breast tenderness or nipple discharge. (exam performed in the presence  of a chaperone) ? ?LABORATORY DATA:  ?I have reviewed the data as listed ? ?  Latest Ref Rng & Units 11/29/2020  ?  4:54 PM 04/27/2020  ?  4:53 PM 01/26/2020  ?  5:28 PM  ?CMP  ?Glucose 65 - 99 mg/dL 100   107   96    ?BUN 8 - 27 mg/dL 19   20   22     ?Creatinine 0.57 - 1.00 mg/dL 0.99   0.93   1.17    ?Sodium 134 - 144 mmol/L 143   143   140    ?Potassium 3.5 - 5.2 mmol/L 4.1   4.5   4.0    ?Chloride 96 - 106 mmol/L 106   106   101    ?CO2 20 - 29 mmol/L 24   25   26     ?Calcium 8.7 - 10.3 mg/dL 9.6   9.5   9.8    ?Total Protein 6.0 - 8.5 g/dL 6.9   6.7     ?Total Bilirubin 0.0 - 1.2 mg/dL 0.2   0.3     ?Alkaline Phos 44 - 121 IU/L 97   86     ?AST 0 - 40 IU/L 14   15      ?ALT 0 - 32 IU/L 13   12     ? ? ?Lab Results  ?Component Value Date  ? WBC 5.7 11/29/2020  ? HGB 10.8 (L) 11/29/2020  ? HCT 34.0 11/29/2020  ? MCV 83 11/29/2020  ? PLT 246 11/29/2020  ? NEUTROABS 3.6 11/20/2020  ? ? ?ASSESSMENT & PLAN:  ?No problem-specific Assessment & Plan notes found for this encounter. ? ? ? ?No orders of the defined types were placed in this encounter. ? ?The patient has a good understanding of the overall plan. she agrees with it. she will call with any problems that may develop before the next visit here. ?Total time spent: 30 mins including face to face time and time spent for planning, charting and co-ordination of care ? ? Suzzette Righter, CMA ?11/02/21 ? ? ? *** presents to the clinic today for a follow-up  ?

## 2021-11-05 ENCOUNTER — Telehealth: Payer: Self-pay | Admitting: Hematology and Oncology

## 2021-11-05 NOTE — Telephone Encounter (Signed)
Rescheduled appointment per room.resource. Patient was scheduled during a time when Dr.Gudena will have admin time. Left message. Patient will be mailed an updated calendar. ?

## 2021-11-12 ENCOUNTER — Telehealth: Payer: Self-pay | Admitting: *Deleted

## 2021-11-12 NOTE — Progress Notes (Signed)
? ?Patient Care Team: ?Pcp, No as PCP - General ? ?DIAGNOSIS:  ?Encounter Diagnosis  ?Name Primary?  ? Acute pulmonary embolism, unspecified pulmonary embolism type, unspecified whether acute cor pulmonale present ([place])   ? ? ?CHIEF COMPLIANT: Follow-up of pulmonary embolism ?  ?INTERVAL HISTORY: Joan [name] is a 66 y.o. with above-mentioned history of pulmonary embolism currently on Eliquis. She presents to the clinic today for follow-up.  She's getting orthopena and PND and LE edema. ?Continuing Eliquis. ? ? ?ALLERGIES:  is allergic to penicillins. ? ?MEDICATIONS:  ?Current Outpatient Medications  ?Medication Sig Dispense Refill  ? albuterol (VENTOLIN HFA) 108 (90 Base) MCG/ACT inhaler Inhale 2 puffs into the lungs every 6 (six) hours as needed for wheezing or shortness of breath. 18 g 1  ? apixaban (ELIQUIS) 5 MG TABS tablet Take 1 tablet (5 mg total) by mouth 2 (two) times daily. 60 tablet 11  ? ASCORBIC ACID PO Take 1 tablet by mouth daily. (Patient not taking: No sig reported)    ? cholecalciferol (VITAMIN D3) 25 MCG (1000 UT) tablet Take 1,000 Units by mouth daily.    ? CYCLOBENZAPRINE HCL PO Take 1 tablet by mouth at bedtime as needed.    ? diclofenac sodium (VOLTAREN) 1 % GEL Apply 2 g topically 4 (four) times daily. 100 g 2  ? ezetimibe-simvastatin (VYTORIN) 10-40 MG tablet Take 1 tablet by mouth daily. 90 tablet 1  ? Fe Fum-FePoly-FA-Vit C-Vit B3 (INTEGRA F) 125-1 MG CAPS Take 1 tablet by mouth daily. 30 capsule 2  ? hydrochlorothiazide (HYDRODIURIL) 25 MG tablet Take 1 tablet (25 mg total) by mouth daily. 90 tablet 1  ? Insulin Pen Needle (PEN NEEDLES) 32G X 4 MM MISC 1 each by Does not apply route daily. Use as directed daily with saxenda 100 each 3  ? lidocaine (LIDODERM) 5 % Place 1 patch onto the skin daily. Remove & Discard patch within 12 hours or as directed by MD 30 patch 0  ? Liraglutide -Weight Management (SAXENDA) 18 MG/3ML SOPN Inject 3 mg into the skin daily. 15 mL 0  ?  lisinopril-hydrochlorothiazide (ZESTORETIC) 20-25 MG tablet Take 1 tablet by mouth daily. 90 tablet 1  ? methocarbamol (ROBAXIN) 500 MG tablet Take 1 tablet (500 mg total) by mouth 2 (two) times daily. 20 tablet 0  ? methocarbamol (ROBAXIN) 750 MG tablet Take 750 mg by mouth 2 (two) times daily as needed for muscle spasms.    ? Multiple Vitamins-Minerals (MULTIVITAMIN PO) Take 1 tablet by mouth daily.    ? MYRBETRIQ 50 MG TB24 tablet Take 50 mg by mouth daily.    ? omeprazole (PRILOSEC) 40 MG capsule Take 40 mg by mouth daily.    ? predniSONE (DELTASONE) 20 MG tablet 2 tabs po daily x 3 days 6 tablet 0  ? solifenacin (VESICARE) 10 MG tablet Take 1 tablet by mouth once daily 30 tablet 0  ? traMADol (ULTRAM) 50 MG tablet Take 1 tablet (50 mg total) by mouth every 6 (six) hours as needed. 30 tablet   ? Vitamin D, Ergocalciferol, (DRISDOL) 1.25 MG (50000 UNIT) CAPS capsule Take 1 capsule (50,000 Units total) by mouth every 7 (seven) days. 12 capsule 0  ? ?No current facility-administered medications for this visit.  ? ? ?PHYSICAL EXAMINATION: ?ECOG PERFORMANCE STATUS: 1 - Symptomatic but completely ambulatory ? ?There were no vitals filed for this visit. ?There were no vitals filed for this visit. ?  ? ?LABORATORY DATA:  ?I have reviewed the  data as listed ? ?  Latest Ref Rng & Units 11/29/2020  ?  4:54 PM 04/27/2020  ?  4:53 PM 01/26/2020  ?  5:28 PM  ?CMP  ?Glucose 65 - 99 mg/dL 100   107   96    ?BUN 8 - 27 mg/dL 19   20   22     ?Creatinine 0.57 - 1.00 mg/dL 0.99   0.93   1.17    ?Sodium 134 - 144 mmol/L 143   143   140    ?Potassium 3.5 - 5.2 mmol/L 4.1   4.5   4.0    ?Chloride 96 - 106 mmol/L 106   106   101    ?CO2 20 - 29 mmol/L 24   25   26     ?Calcium 8.7 - 10.3 mg/dL 9.6   9.5   9.8    ?Total Protein 6.0 - 8.5 g/dL 6.9   6.7     ?Total Bilirubin 0.0 - 1.2 mg/dL 0.2   0.3     ?Alkaline Phos 44 - 121 IU/L 97   86     ?AST 0 - 40 IU/L 14   15     ?ALT 0 - 32 IU/L 13   12     ? ? ?Lab Results  ?Component Value Date   ? WBC 5.7 11/29/2020  ? HGB 10.8 (L) 11/29/2020  ? HCT 34.0 11/29/2020  ? MCV 83 11/29/2020  ? PLT 246 11/29/2020  ? NEUTROABS 3.6 11/20/2020  ? ? ?ASSESSMENT & PLAN:  ?Acute pulmonary embolism ([place]) ?01/21/2020: CT angiogram which showed bilateral segmental pulmonary emboli without evidence of right heart strain ?lower extremity Dopplers which showed an 65-indeterminate DVT involving the left femoral vein ?  ?Current treatment: Eliquis ?  ?Inherited risk factors : Came back completely negative  ?Acquired risk factors include: ?1. Antiphospholipid antibody syndrome: Negative ?2. Obesity ?3. Sedentary behavior ?  ?Recommendation: Anticoagulation indefinitely given her obesity and her difficulty with ambulation and lower extremity edema ?Patient patient has prior history of anemia and iron deficiency.  She is currently on oral iron tablets.    ?  ?11/20/2020: Hemoglobin 10  ?I suspect that the cause of anemia is less likely to be iron deficiency but more likely to be anemia of chronic inflammation ?She is applying for permanent disability.   ?  ??CHF: I requested Dr.Varanasi to see her ? ?Return to clinic in 1 year with labs and follow-up ? ? ? ?No orders of the defined types were placed in this encounter. ? ?The patient has a good understanding of the overall plan. she agrees with it. she will call with any problems that may develop before the next visit here. ?Total time spent: 30 mins including face to face time and time spent for planning, charting and co-ordination of care ? ? Harriette Ohara, MD ?11/26/21 ? ? ? I Gardiner Coins am scribing for Dr. Lindi Adie ? ?I have reviewed the above documentation for accuracy and completeness, and I agree with the above. ?  ?

## 2021-11-12 NOTE — Telephone Encounter (Signed)
Per MD request, upcomming appt changed to a telephone visit.  RN attempt x1 to contact pt.  No answer, LVM with appt details.  ?

## 2021-11-16 ENCOUNTER — Inpatient Hospital Stay: Payer: No Typology Code available for payment source | Admitting: Hematology and Oncology

## 2021-11-26 ENCOUNTER — Inpatient Hospital Stay: Payer: BC Managed Care – PPO | Attending: Hematology and Oncology | Admitting: Hematology and Oncology

## 2021-11-26 DIAGNOSIS — I2699 Other pulmonary embolism without acute cor pulmonale: Secondary | ICD-10-CM | POA: Diagnosis not present

## 2021-11-26 NOTE — Assessment & Plan Note (Signed)
01/21/2020:?CT angiogram which showed bilateral segmental pulmonary emboli without evidence of right heart strain ?lower extremity Dopplers which showed an age-indeterminate DVT involving the left femoral vein ?? ?Current treatment: Eliquis ?? ?Inherited risk factors?: Came back completely negative? ?Acquired risk factors?include: ?1. Antiphospholipid antibody syndrome: Negative ?2. Obesity ?3. Sedentary behavior ?? ?Recommendation: Anticoagulation indefinitely given her obesity and her difficulty with ambulation and lower extremity edema ?? ?Patient patient has prior history of anemia and iron deficiency. ?She is currently on oral iron tablets.??? ?  ?11/20/2020: Hemoglobin 10.5 I suspect that the cause of anemia is less likely to be iron deficiency but more likely to be anemia of chronic inflammation ?Iron studies were reviewed ?She is applying for permanent disability.  She will discuss this with her primary care physician. ?? ?Return to clinic in 1 year with labs and follow-up ?

## 2021-11-30 ENCOUNTER — Encounter: Payer: Self-pay | Admitting: Internal Medicine

## 2021-11-30 ENCOUNTER — Ambulatory Visit (INDEPENDENT_AMBULATORY_CARE_PROVIDER_SITE_OTHER): Payer: BC Managed Care – PPO | Admitting: Internal Medicine

## 2021-11-30 VITALS — BP 124/88 | HR 74 | Ht 66.5 in | Wt 361.8 lb

## 2021-11-30 DIAGNOSIS — Z79899 Other long term (current) drug therapy: Secondary | ICD-10-CM | POA: Diagnosis not present

## 2021-11-30 DIAGNOSIS — I82412 Acute embolism and thrombosis of left femoral vein: Secondary | ICD-10-CM | POA: Diagnosis not present

## 2021-11-30 DIAGNOSIS — I2699 Other pulmonary embolism without acute cor pulmonale: Secondary | ICD-10-CM

## 2021-11-30 DIAGNOSIS — I10 Essential (primary) hypertension: Secondary | ICD-10-CM | POA: Diagnosis not present

## 2021-11-30 NOTE — Patient Instructions (Signed)
Medication Instructions:  Your physician recommends that you continue on your current medications as directed. Please refer to the Current Medication list given to you today.  *If you need a refill on your cardiac medications before your next appointment, please call your pharmacy*   Lab Work: BMET in 10 days   If you have labs (blood work) drawn today and your tests are completely normal, you will receive your results only by: MyChart Message (if you have MyChart) OR A paper copy in the mail If you have any lab test that is abnormal or we need to change your treatment, we will call you to review the results.   Testing/Procedures:  Your physician has requested that you have an echocardiogram. Echocardiography is a painless test that uses sound waves to create images of your heart. It provides your doctor with information about the size and shape of your heart and how well your heart's chambers and valves are working. This procedure takes approximately one hour. There are no restrictions for this procedure.    Follow-Up: At [facility], you and your health needs are our priority.  As part of our continuing mission to provide you with exceptional heart care, we have created designated Provider Care Teams.  These Care Teams include your primary Cardiologist (physician) and Advanced Practice Providers (APPs -  Physician Assistants and Nurse Practitioners) who all work together to provide you with the care you need, when you need it.  We recommend signing up for the patient portal called "MyChart".  Sign up information is provided on this After Visit Summary.  MyChart is used to connect with patients for Virtual Visits (Telemedicine).  Patients are able to view lab/test results, encounter notes, upcoming appointments, etc.  Non-urgent messages can be sent to your provider as well.   To learn more about what you can do with MyChart, go to [email or web address].     Important Information  About Sugar

## 2021-11-30 NOTE — Progress Notes (Unsigned)
Cardiology Office Note   Date:  11/30/2021   ID:  Gypsy Lore, DOB [birth year], MRN [id number]  PCP:  Pcp, No  Cardiologist:   Dietrich Pates, MD       History of Present Illness: Joan [name] is a 66 y.o. female with a history of PE  Followed in hematology   June 21   COVID vaccine   Few days  had increased SOB   had PE  , LLE  Got blood thinner and breathing improved   Coughting stopped  Then straddel meds meds   Then cougint started again    Started back on anticoagulant   Still SOB   has some coughing    Using inhaler    [facility]   Rx CPAP  Sleeping  Propped up   has wheezing   Up every 2 hours to urinated   Chest sharp pains   Mid   Has refulx  '  Br:  Saxenda  ON it for 3 days   Br:  E[employer]ure [place]   Liver and onions    Current Meds  Medication Sig   albuterol (VENTOLIN HFA) 108 (90 Base) MCG/ACT inhaler Inhale 2 puffs into the lungs every 6 (six) hours as needed for wheezing or shortness of breath.   apixaban (ELIQUIS) 5 MG TABS tablet Take 1 tablet (5 mg total) by mouth 2 (two) times daily.   ASCORBIC ACID PO Take 1 tablet by mouth daily.   cholecalciferol (VITAMIN D3) 25 MCG (1000 UT) tablet Take 1,000 Units by mouth daily.   CYCLOBENZAPRINE HCL PO Take 1 tablet by mouth at bedtime as needed.   diclofenac sodium (VOLTAREN) 1 % GEL Apply 2 g topically 4 (four) times daily.   ezetimibe-simvastatin (VYTORIN) 10-40 MG tablet Take 1 tablet by mouth daily.   Fe Fum-FePoly-FA-Vit C-Vit B3 (INTEGRA F) 125-1 MG CAPS Take 1 tablet by mouth daily.   hydrochlorothiazide (HYDRODIURIL) 25 MG tablet Take 1 tablet (25 mg total) by mouth daily.   Insulin Pen Needle (PEN NEEDLES) 32G X 4 MM MISC 1 each by Does not apply route daily. Use as directed daily with saxenda   lidocaine (LIDODERM) 5 % Place 1 patch onto the skin daily. Remove & Discard patch within 12 hours or as directed by MD   Liraglutide -Weight Management (SAXENDA) 18 MG/3ML SOPN Inject 3 mg into  the skin daily.   lisinopril-hydrochlorothiazide (ZESTORETIC) 20-25 MG tablet Take 1 tablet by mouth daily.   methocarbamol (ROBAXIN) 500 MG tablet Take 1 tablet (500 mg total) by mouth 2 (two) times daily.   methocarbamol (ROBAXIN) 750 MG tablet Take 750 mg by mouth 2 (two) times daily as needed for muscle spasms.   Multiple Vitamins-Minerals (MULTIVITAMIN PO) Take 1 tablet by mouth daily.   MYRBETRIQ 50 MG TB24 tablet Take 50 mg by mouth daily.   omeprazole (PRILOSEC) 40 MG capsule Take 40 mg by mouth daily.   predniSONE (DELTASONE) 20 MG tablet 2 tabs po daily x 3 days   solifenacin (VESICARE) 10 MG tablet Take 1 tablet by mouth once daily   traMADol (ULTRAM) 50 MG tablet Take 1 tablet (50 mg total) by mouth every 6 (six) hours as needed.   Vitamin D, Ergocalciferol, (DRISDOL) 1.25 MG (50000 UNIT) CAPS capsule Take 1 capsule (50,000 Units total) by mouth every 7 (seven) days.     Allergies:   Penicillins   Past Medical History:  Diagnosis Date   Acid reflux  Diabetes mellitus without complication (HCC)    Pt states she is prediabetic   Hypertension    OSA (obstructive sleep apnea)     Past Surgical History:  Procedure Laterality Date   ABDOMINAL HYSTERECTOMY     TONSILECTOMY, ADENOIDECTOMY, BILATERAL MYRINGOTOMY AND TUBES       Social History:  The patient  reports that she has never smoked. She has never used smokeless tobacco. She reports that she does not drink alcohol and does not use drugs.   Family History:  The patient's family history includes Heart disease in her father and mother; Prostate cancer in her father.    ROS:  Please see the history of present illness. All other systems are reviewed and  Negative to the above problem except as noted.    PHYSICAL EXAM: VS:  BP 124/88   Pulse 74   Ht 5' 6.5" (1.689 m)   Wt (!) 361 lb 12.8 oz (164.1 kg)   SpO2 96%   BMI 57.52 kg/m   GEN: Well nourished, well developed, in no acute distress  HEENT: normal  Neck:  no JVD, carotid bruits, or masses Cardiac: RRR; no murmurs, rubs, or gallops,no edema  Respiratory:  clear to auscultation bilaterally, normal work of breathing GI: soft, nontender, nondistended, + BS  No hepatomegaly  MS: no deformity Moving all extremities   Skin: warm and dry, no rash Neuro:  Strength and sensation are intact Psych: euthymic mood, full affect   EKG:  EKG is ordered today.   Lipid Panel    Component Value Date/Time   CHOL 177 06/29/2020 1631   TRIG 78 06/29/2020 1631   HDL 57 06/29/2020 1631   CHOLHDL 3.1 06/29/2020 1631   CHOLHDL 4.1 09/16/2009 0430   VLDL 10 09/16/2009 0430   LDLCALC 105 (H) 06/29/2020 1631      Wt Readings from Last 3 Encounters:  11/30/21 (!) 361 lb 12.8 oz (164.1 kg)  05/29/21 (!) 363 lb 9.6 oz (164.9 kg)  11/29/20 (!) 340 lb 3.2 oz (154.3 kg)      ASSESSMENT AND PLAN:     Current medicines are reviewed at length with the patient today.  The patient does not have concerns regarding medicines.  Signed, Dietrich Pates, MD  11/30/2021 3:23 PM    [facility] [address] [place], [place], [place]  [address] Phone: [phone]; Fax: [phone]

## 2021-12-21 ENCOUNTER — Ambulatory Visit (HOSPITAL_COMMUNITY): Payer: BC Managed Care – PPO | Attending: Internal Medicine

## 2021-12-21 ENCOUNTER — Other Ambulatory Visit: Payer: BC Managed Care – PPO

## 2021-12-21 DIAGNOSIS — I82412 Acute embolism and thrombosis of left femoral vein: Secondary | ICD-10-CM | POA: Diagnosis present

## 2021-12-21 DIAGNOSIS — I10 Essential (primary) hypertension: Secondary | ICD-10-CM | POA: Diagnosis present

## 2021-12-21 DIAGNOSIS — I2699 Other pulmonary embolism without acute cor pulmonale: Secondary | ICD-10-CM | POA: Diagnosis present

## 2021-12-21 DIAGNOSIS — Z79899 Other long term (current) drug therapy: Secondary | ICD-10-CM

## 2021-12-21 DIAGNOSIS — I2602 Saddle embolus of pulmonary artery with acute cor pulmonale: Secondary | ICD-10-CM

## 2021-12-21 LAB — BASIC METABOLIC PANEL
BUN/Creatinine Ratio: 24 (ref 12–28)
BUN: 24 mg/dL (ref 8–27)
CO2: 27 mmol/L (ref 20–29)
Calcium: 9.7 mg/dL (ref 8.7–10.3)
Chloride: 103 mmol/L (ref 96–106)
Creatinine, Ser: 0.99 mg/dL (ref 0.57–1.00)
Glucose: 82 mg/dL (ref 70–99)
Potassium: 4.1 mmol/L (ref 3.5–5.2)
Sodium: 139 mmol/L (ref 134–144)
eGFR: 64 mL/min/{1.73_m2} (ref >59)

## 2021-12-21 LAB — ECHOCARDIOGRAM COMPLETE
AR max vel: 2.24 cm2
AV Area VTI: 2.11 cm2
AV Area mean vel: 2.24 cm2
AV Mean grad: 12 mmHg
AV Peak grad: 21.2 mmHg
Ao pk vel: 2.3 m/s
Area-P 1/2: 3.27 cm2
S' Lateral: 3.6 cm

## 2021-12-26 ENCOUNTER — Telehealth: Payer: Self-pay

## 2021-12-26 NOTE — Telephone Encounter (Signed)
Pt advised her Echo results and she notes that she did not take her HCTZ regularly as prescribed in May due to her finances but the last several weeks she has been taking daily since the office in [place] has given her assistance and she says she feels "better" and her breathing has been much improved... no cough and no peripheral edema.   She says she is due to be seen at [facility] and says she will call to make her appt this week and will let me know when it is scheduled for.   Will forward to Dr Tenny Craw for her review.

## 2021-12-26 NOTE — Telephone Encounter (Signed)
-----   Message from Dorris Carnes V, MD sent at 12/24/2021  4:30 PM EDT ----- Pumping function of the R and L ventricles are normal Aortic valve is mildly stenotic   Will follow over time  When I saw her in May I recomm she start HCTZ   DId she do it?   How is her breathing ?   What about coughing?    When will she follow with [facility]?

## 2022-01-09 ENCOUNTER — Other Ambulatory Visit: Payer: Self-pay | Admitting: Family Medicine

## 2022-01-09 DIAGNOSIS — Z1231 Encounter for screening mammogram for malignant neoplasm of breast: Secondary | ICD-10-CM

## 2022-04-24 ENCOUNTER — Ambulatory Visit: Payer: BC Managed Care – PPO | Admitting: Pulmonary Disease

## 2022-05-14 ENCOUNTER — Ambulatory Visit: Payer: BC Managed Care – PPO | Admitting: Pulmonary Disease

## 2022-05-17 ENCOUNTER — Telehealth: Payer: Self-pay

## 2022-05-20 ENCOUNTER — Encounter: Payer: Self-pay | Admitting: Pulmonary Disease

## 2022-05-20 ENCOUNTER — Ambulatory Visit (INDEPENDENT_AMBULATORY_CARE_PROVIDER_SITE_OTHER): Payer: BC Managed Care – PPO | Admitting: Pulmonary Disease

## 2022-05-20 VITALS — BP 130/64 | HR 87 | Temp 97.7°F | Ht 66.0 in | Wt 368.4 lb

## 2022-05-20 DIAGNOSIS — G4733 Obstructive sleep apnea (adult) (pediatric): Secondary | ICD-10-CM | POA: Diagnosis not present

## 2022-05-20 NOTE — Progress Notes (Signed)
Joan [name]    [id number]    Jun 11, 1956  Primary Care Physician:Pcp, No  Referring Physician: No referring provider defined for this encounter.  Chief complaint:   Patient being seen for obstructive sleep apnea  HPI:  History of obstructive sleep apnea for which she was on CPAP therapy  Last use CPAP about 3 years ago  Having increased symptoms of daytime sleepiness and fatigue Continuing weight gain  She is limited with back pain, limited ambulation  She tolerated CPAP well without significant difficulty when she got used to using it    Outpatient Encounter Medications as of 05/20/2022  Medication Sig   albuterol (VENTOLIN HFA) 108 (90 Base) MCG/ACT inhaler Inhale 2 puffs into the lungs every 6 (six) hours as needed for wheezing or shortness of breath.   apixaban (ELIQUIS) 5 MG TABS tablet Take 1 tablet (5 mg total) by mouth 2 (two) times daily.   ASCORBIC ACID PO Take 1 tablet by mouth daily.   cholecalciferol (VITAMIN D3) 25 MCG (1000 UT) tablet Take 1,000 Units by mouth daily.   ezetimibe-simvastatin (VYTORIN) 10-40 MG tablet Take 1 tablet by mouth daily.   Fe Fum-FePoly-FA-Vit C-Vit B3 (INTEGRA F) 125-1 MG CAPS Take 1 tablet by mouth daily.   GEMTESA 75 MG TABS Take 1 tablet by mouth daily.   hydrochlorothiazide (HYDRODIURIL) 25 MG tablet Take 1 tablet (25 mg total) by mouth daily.   Liraglutide -Weight Management (SAXENDA) 18 MG/3ML SOPN Inject 3 mg into the skin daily.   lisinopril-hydrochlorothiazide (ZESTORETIC) 20-25 MG tablet Take 1 tablet by mouth daily.   methocarbamol (ROBAXIN) 750 MG tablet Take 750 mg by mouth 2 (two) times daily as needed for muscle spasms.   Multiple Vitamins-Minerals (MULTIVITAMIN PO) Take 1 tablet by mouth daily.   omeprazole (PRILOSEC) 40 MG capsule Take 40 mg by mouth daily.   solifenacin (VESICARE) 10 MG tablet Take 1 tablet by mouth once daily   traMADol (ULTRAM) 50 MG tablet Take 1 tablet (50 mg total) by mouth  every 6 (six) hours as needed.   Vitamin D, Ergocalciferol, (DRISDOL) 1.25 MG (50000 UNIT) CAPS capsule Take 1 capsule (50,000 Units total) by mouth every 7 (seven) days.   CYCLOBENZAPRINE HCL PO Take 1 tablet by mouth at bedtime as needed. (Patient not taking: Reported on 05/20/2022)   diclofenac sodium (VOLTAREN) 1 % GEL Apply 2 g topically 4 (four) times daily. (Patient not taking: Reported on 05/20/2022)   Insulin Pen Needle (PEN NEEDLES) 32G X 4 MM MISC 1 each by Does not apply route daily. Use as directed daily with saxenda (Patient not taking: Reported on 05/20/2022)   lidocaine (LIDODERM) 5 % Place 1 patch onto the skin daily. Remove & Discard patch within 12 hours or as directed by MD (Patient not taking: Reported on 05/20/2022)   methocarbamol (ROBAXIN) 500 MG tablet Take 1 tablet (500 mg total) by mouth 2 (two) times daily. (Patient not taking: Reported on 05/20/2022)   MYRBETRIQ 50 MG TB24 tablet Take 50 mg by mouth daily. (Patient not taking: Reported on 05/20/2022)   predniSONE (DELTASONE) 20 MG tablet 2 tabs po daily x 3 days (Patient not taking: Reported on 05/20/2022)   No facility-administered encounter medications on file as of 05/20/2022.    Allergies as of 05/20/2022 - Review Complete 05/20/2022  Allergen Reaction Noted   Penicillins      Past Medical History:  Diagnosis Date   Acid reflux  Diabetes mellitus without complication (HCC)    Pt states she is prediabetic   Hypertension    OSA (obstructive sleep apnea)     Past Surgical History:  Procedure Laterality Date   ABDOMINAL HYSTERECTOMY     TONSILECTOMY, ADENOIDECTOMY, BILATERAL MYRINGOTOMY AND TUBES      Family History  Problem Relation Age of Onset   Heart disease Mother    Heart disease Father    Prostate cancer Father     Social History   Socioeconomic History   Marital status: Single    Spouse name: Not on file   Number of children: Not on file   Years of education: Not on file   Highest  education level: Not on file  Occupational History   Occupation: CNA  Tobacco Use   Smoking status: Never   Smokeless tobacco: Never  Substance and Sexual Activity   Alcohol use: No   Drug use: No   Sexual activity: Not on file  Other Topics Concern   Not on file  Social History Narrative   ** Merged History Encounter **       Social Determinants of Health   Financial Resource Strain: Not on file  Food Insecurity: Not on file  Transportation Needs: Not on file  Physical Activity: Not on file  Stress: Not on file  Social Connections: Not on file  Intimate Partner Violence: Not on file    Review of Systems  Respiratory:  Positive for apnea and shortness of breath.   Psychiatric/Behavioral:  Positive for sleep disturbance.     Vitals:   05/20/22 1101  BP: 130/64  Pulse: 87  Temp: 97.7 F (36.5 C)  SpO2: 97%     Physical Exam Constitutional:      Appearance: She is obese.  HENT:     Head: Normocephalic.     Mouth/Throat:     Mouth: Mucous membranes are moist.  Eyes:     Pupils: Pupils are equal, round, and reactive to light.  Cardiovascular:     Rate and Rhythm: Normal rate and regular rhythm.     Heart sounds: No murmur heard.    No friction rub.  Pulmonary:     Effort: No respiratory distress.     Breath sounds: No stridor. No wheezing or rhonchi.  Musculoskeletal:        General: Swelling present.     Cervical back: No rigidity or tenderness.  Neurological:     Mental Status: She is alert.  Psychiatric:        Mood and Affect: Mood normal.   Data Reviewed: Previous records reviewed  Records with Dr. Elsworth Soho reviewed  Assessment:  Moderate probability of significant obstructive sleep apnea  Patient was on CPAP therapy before  Class III obesity  Pathophysiology of sleep disordered breathing and treatment options discussed with the patient  Risks with not treating sleep disordered breathing discussed with the  patient  Plan/Recommendations: Schedule patient for an in lab split-night study  Encouraged to definitely work on weight loss  Graded activities as tolerated  Tentative follow-up in about 3 to 4 months    Sherrilyn Rist MD [facility] 05/20/2022, 11:25 AM  CC: No ref. provider found

## 2022-05-20 NOTE — Patient Instructions (Signed)
Schedule for sleep study-split-night study  We will see you back in about 3 to 4 months  Call us with significant concerns  Weight loss efforts  Try and take your water pills in the morning rather than at night as this will lessen the number of times you have to wake up to pee

## 2022-06-27 ENCOUNTER — Telehealth: Payer: Self-pay | Admitting: Pulmonary Disease

## 2022-06-27 NOTE — Telephone Encounter (Signed)
ATC left message to call back   Primary Pulmonologist: Vassie Loll Last office visit and with whom: 05/20/2022  Tomma Lightning, MD  What do we see them for (pulmonary problems): OSA Last OV assessment/plan: Plan/Recommendations: Schedule patient for an in lab split-night study   Encouraged to definitely work on weight loss   Graded activities as tolerated  Was appointment offered to patient (explain)?  Patient declined appointment.    Reason for call: Patient states having symptoms of cough, yellow mucus, and wheezing. Pharmacy is [facility]   (examples of things to ask: : When did symptoms start? Fever? Cough? Productive? Color to sputum? More sputum than usual? Wheezing? Have you needed increased oxygen? Are you taking your respiratory medications? What over the counter measures have you tried?)  Allergies  Allergen Reactions   Penicillins     REACTION: hives    Immunization History  Administered Date(s) Administered   Influenza Split 07/18/2011   Influenza,inj,Quad PF,6+ Mos 12/04/2011, 06/29/2020   PFIZER(Purple Top)SARS-COV-2 Vaccination 12/17/2019, 01/12/2020   Pneumococcal Polysaccharide-23 06/29/2020

## 2022-06-28 NOTE — Telephone Encounter (Signed)
Patient is returning phone call. Patient phone number is [phone].

## 2022-06-30 ENCOUNTER — Emergency Department (HOSPITAL_BASED_OUTPATIENT_CLINIC_OR_DEPARTMENT_OTHER)
Admission: EM | Admit: 2022-06-30 | Discharge: 2022-06-30 | Disposition: A | Discharge status: Home / Self Care | Payer: Medicare PPO | Attending: Emergency Medicine | Admitting: Emergency Medicine

## 2022-06-30 ENCOUNTER — Encounter (HOSPITAL_BASED_OUTPATIENT_CLINIC_OR_DEPARTMENT_OTHER): Payer: Self-pay | Admitting: Emergency Medicine

## 2022-06-30 ENCOUNTER — Emergency Department (HOSPITAL_BASED_OUTPATIENT_CLINIC_OR_DEPARTMENT_OTHER): Payer: Medicare PPO

## 2022-06-30 DIAGNOSIS — E119 Type 2 diabetes mellitus without complications: Secondary | ICD-10-CM | POA: Insufficient documentation

## 2022-06-30 DIAGNOSIS — R059 Cough, unspecified: Secondary | ICD-10-CM | POA: Diagnosis present

## 2022-06-30 DIAGNOSIS — J189 Pneumonia, unspecified organism: Secondary | ICD-10-CM | POA: Diagnosis not present

## 2022-06-30 DIAGNOSIS — I10 Essential (primary) hypertension: Secondary | ICD-10-CM | POA: Insufficient documentation

## 2022-06-30 DIAGNOSIS — Z20822 Contact with and (suspected) exposure to covid-19: Secondary | ICD-10-CM | POA: Insufficient documentation

## 2022-06-30 LAB — RESP PANEL BY RT-PCR (RSV, FLU A&B, COVID)  RVPGX2
Influenza A by PCR: NEGATIVE
Influenza B by PCR: NEGATIVE
Resp Syncytial Virus by PCR: NEGATIVE
SARS Coronavirus 2 by RT PCR: NEGATIVE

## 2022-06-30 MED ORDER — ALBUTEROL SULFATE HFA 108 (90 BASE) MCG/ACT IN AERS: 1.0000 | INHALATION_SPRAY | Freq: Four times a day (QID) | RESPIRATORY_TRACT | 0 refills | Status: AC | PRN

## 2022-06-30 MED ORDER — DOXYCYCLINE HYCLATE 100 MG PO TABS
100.0000 mg | ORAL_TABLET | Freq: Once | ORAL | Status: AC
  Administered 2022-06-30: 100 mg via ORAL
  Filled 2022-06-30: qty 1

## 2022-06-30 MED ORDER — DOXYCYCLINE HYCLATE 100 MG PO CAPS: 100.0000 mg | ORAL_CAPSULE | Freq: Two times a day (BID) | ORAL | 0 refills | Status: AC

## 2022-06-30 NOTE — Discharge Instructions (Signed)

## 2022-06-30 NOTE — ED Provider Notes (Signed)
Emergency Department Provider Note   I have reviewed the triage vital signs and the nursing notes.   HISTORY  Chief Complaint Cough   HPI Joan [name] is a 66 y.o. female past history of diabetes, hypertension, obstructive sleep apnea presents to the emergency department with cough, congestion, fatigue, body aches.  Symptoms have been progressing over the past 7 days.  Denies any abdominal pain, vomiting, diarrhea.  She has persistent cough which keeps her up at night.  No prior history of asthma or COPD.  Has been trying over-the-counter cough and cold medications with little relief.  Past Medical History:  Diagnosis Date   Acid reflux    Diabetes mellitus without complication (HCC)    Pt states she is prediabetic   Hypertension    OSA (obstructive sleep apnea)     Review of Systems  Constitutional: No fever. Positive body aches and fatigue.  ENT: No sore throat. Cardiovascular: Denies chest pain. Respiratory: Denies shortness of breath. Positive cough.  Gastrointestinal: No abdominal pain.  No nausea, no vomiting.  No diarrhea.  No constipation. Genitourinary: Negative for dysuria. Musculoskeletal: Negative for back pain. Skin: Negative for rash. Neurological: Negative for headaches.   ____________________________________________   PHYSICAL EXAM:  VITAL SIGNS: ED Triage Vitals  Enc Vitals Group     BP 06/30/22 0653 (!) 175/75     Pulse Rate 06/30/22 0653 80     Resp 06/30/22 0653 18     Temp 06/30/22 0653 98 F (36.7 C)     Temp Source 06/30/22 0653 Oral     SpO2 06/30/22 0653 96 %     Weight 06/30/22 0659 (!) 365 lb 6.4 oz (165.7 kg)     Height 06/30/22 0659 5\' 6"  (1.676 m)   Constitutional: Alert and oriented. Well appearing and in no acute distress. Eyes: Conjunctivae are normal.  Head: Atraumatic. Nose: Mild congestion/rhinnorhea. Mouth/Throat: Mucous membranes are moist.  Oropharynx with mild erythema. No exudate.  Neck: No stridor.    Cardiovascular: Normal rate, regular rhythm. Good peripheral circulation. Grossly normal heart sounds.   Respiratory: Normal respiratory effort.  No retractions. Lungs without rales or rhonchi. Faint end-expiratory wheezing.  Gastrointestinal: Soft and nontender. No distention.  Musculoskeletal: No lower extremity tenderness nor edema. No gross deformities of extremities. Neurologic:  Normal speech and language. No gross focal neurologic deficits are appreciated.  Skin:  Skin is warm, dry and intact. No rash noted.   ____________________________________________   LABS (all labs ordered are listed, but only abnormal results are displayed)  Labs Reviewed  RESP PANEL BY RT-PCR (RSV, FLU A&B, COVID)  RVPGX2   ____________________________________________  RADIOLOGY  DG Chest 2 View  Result Date: 06/30/2022 CLINICAL DATA:  66 year old female with history of productive cough and congestion for 1 week. Subjective fever. EXAM: CHEST - 2 VIEW COMPARISON:  Chest x-ray 01/31/2020. FINDINGS: Mass-like fullness in the perihilar aspect of the right lung with ill-defined consolidative changes in the right lower lung. Linear opacities in the left mid lung, unchanged, likely to represent areas of chronic scarring. No pleural effusions. No pneumothorax. No evidence of pulmonary edema. Heart size is upper limits of normal. Upper mediastinal contours are within normal limits. IMPRESSION: 1. Apparent airspace consolidation in the perihilar aspect of the right lung, most likely in the anterior aspect of the right lower lobe, likely to represent pneumonia. Given the mass-like fullness in the right hilar region, underlying neoplasm is not excluded. Followup PA and lateral chest X-ray is recommended in  3-4 weeks following trial of antibiotic therapy to ensure resolution and exclude underlying malignancy. 2. Mild chronic scarring in the left mid lung, unchanged. Electronically Signed   By: Trudie Reed M.D.   On:  06/30/2022 07:30    ____________________________________________   PROCEDURES  Procedure(s) performed:   Procedures  None  ____________________________________________   INITIAL IMPRESSION / ASSESSMENT AND PLAN / ED COURSE  Pertinent labs & imaging results that were available during my care of the patient were reviewed by me and considered in my medical decision making (see chart for details).   This patient is Presenting for Evaluation of cough, which does require a range of treatment options, and is a complaint that involves a moderate risk of morbidity and mortality.  The Differential Diagnoses include viral URI, bronchitis, CAP, Flu, COVID, etc.   Clinical Laboratory Tests Ordered, included   Radiologic Tests Ordered, included CXR. I independently interpreted the images and agree with radiology interpretation.   Cardiac Monitor Tracing which shows NSR.    Social Determinants of Health Risk patient is a non-smoker.   Medical Decision Making: Summary:  Patient presents to the emergency department for evaluation of cough, congestion, fatigue.  Minimal wheezing on exam with good air movement.  No hypoxemia or increased work of breathing.  Patient outside the window to benefit from specific antiviral treatment for either the flu or COVID we will obtain a viral panel in addition to chest x-ray for further identification of illness and to decide on the need for antibiotic therapy or not.   Reevaluation with update and discussion with patient. Discussed CXR results. Plan for abx, MDI albuterol at home, and continued supportive care. Patient will follow viral panel results in the MyChart app. She is outside the window to be a candidate for Paxlovid or Tamiflu.   Patient's presentation is most consistent with acute complicated illness / injury requiring diagnostic workup.   Disposition: discharge  ____________________________________________  FINAL CLINICAL IMPRESSION(S) / ED  DIAGNOSES  Final diagnoses:  Community acquired pneumonia of right lower lobe of lung     NEW OUTPATIENT MEDICATIONS STARTED DURING THIS VISIT:  New Prescriptions   ALBUTEROL (VENTOLIN HFA) 108 (90 BASE) MCG/ACT INHALER    Inhale 1-2 puffs into the lungs every 6 (six) hours as needed for wheezing or shortness of breath.   DOXYCYCLINE (VIBRAMYCIN) 100 MG CAPSULE    Take 1 capsule (100 mg total) by mouth 2 (two) times daily for 7 days.    Note:  This document was prepared using Dragon voice recognition software and may include unintentional dictation errors.  Alona Bene, MD, [facility] Emergency Medicine    Jaquille Kau, Arlyss Repress, MD 06/30/22 [phone]

## 2022-06-30 NOTE — ED Triage Notes (Signed)
Productive cough, congestion x 1 week. Subjective fever. Pt able to speak in full sentences. Pt reports wheezing. RRT at bedside on arrival to assess. Pt states she feels better today than she has felt, but still feels she needs an antibiotic "to get it all out".

## 2022-08-03 ENCOUNTER — Emergency Department (HOSPITAL_BASED_OUTPATIENT_CLINIC_OR_DEPARTMENT_OTHER)
Admission: EM | Admit: 2022-08-03 | Discharge: 2022-08-03 | Disposition: A | Discharge status: Home / Self Care | Payer: Medicare PPO | Attending: Emergency Medicine | Admitting: Emergency Medicine

## 2022-08-03 ENCOUNTER — Emergency Department (HOSPITAL_BASED_OUTPATIENT_CLINIC_OR_DEPARTMENT_OTHER): Payer: Medicare PPO

## 2022-08-03 ENCOUNTER — Encounter (HOSPITAL_BASED_OUTPATIENT_CLINIC_OR_DEPARTMENT_OTHER): Payer: Self-pay | Admitting: Emergency Medicine

## 2022-08-03 ENCOUNTER — Other Ambulatory Visit: Payer: Self-pay

## 2022-08-03 DIAGNOSIS — Z1152 Encounter for screening for COVID-19: Secondary | ICD-10-CM | POA: Insufficient documentation

## 2022-08-03 DIAGNOSIS — Z794 Long term (current) use of insulin: Secondary | ICD-10-CM | POA: Insufficient documentation

## 2022-08-03 DIAGNOSIS — J189 Pneumonia, unspecified organism: Secondary | ICD-10-CM | POA: Diagnosis not present

## 2022-08-03 DIAGNOSIS — E119 Type 2 diabetes mellitus without complications: Secondary | ICD-10-CM | POA: Insufficient documentation

## 2022-08-03 DIAGNOSIS — I10 Essential (primary) hypertension: Secondary | ICD-10-CM | POA: Diagnosis not present

## 2022-08-03 DIAGNOSIS — R0602 Shortness of breath: Secondary | ICD-10-CM | POA: Diagnosis present

## 2022-08-03 DIAGNOSIS — Z7901 Long term (current) use of anticoagulants: Secondary | ICD-10-CM | POA: Diagnosis not present

## 2022-08-03 LAB — I-STAT VENOUS BLOOD GAS, ED
Acid-Base Excess: 2 mmol/L (ref 0.0–2.0)
Bicarbonate: 28.1 mmol/L — ABNORMAL HIGH (ref 20.0–28.0)
Calcium, Ion: 1.25 mmol/L (ref 1.15–1.40)
HCT: 29 % — ABNORMAL LOW (ref 36.0–46.0)
Hemoglobin: 9.9 g/dL — ABNORMAL LOW (ref 12.0–15.0)
O2 Saturation: 39 %
Potassium: 4 mmol/L (ref 3.5–5.1)
Sodium: 139 mmol/L (ref 135–145)
TCO2: 30 mmol/L (ref 22–32)
pCO2, Ven: 50.6 mmHg (ref 44–60)
pH, Ven: 7.353 (ref 7.25–7.43)
pO2, Ven: 24 mmHg — CL (ref 32–45)

## 2022-08-03 LAB — CBC WITH DIFFERENTIAL/PLATELET
Abs Immature Granulocytes: 0.01 10*3/uL (ref 0.00–0.07)
Basophils Absolute: 0 10*3/uL (ref 0.0–0.1)
Basophils Relative: 0 %
Eosinophils Absolute: 0.2 10*3/uL (ref 0.0–0.5)
Eosinophils Relative: 2 %
HCT: 30.1 % — ABNORMAL LOW (ref 36.0–46.0)
Hemoglobin: 9.3 g/dL — ABNORMAL LOW (ref 12.0–15.0)
Immature Granulocytes: 0 %
Lymphocytes Relative: 27 %
Lymphs Abs: 1.8 10*3/uL (ref 0.7–4.0)
MCH: 26.3 pg (ref 26.0–34.0)
MCHC: 30.9 g/dL (ref 30.0–36.0)
MCV: 85.3 fL (ref 80.0–100.0)
Monocytes Absolute: 0.7 10*3/uL (ref 0.1–1.0)
Monocytes Relative: 10 %
Neutro Abs: 4 10*3/uL (ref 1.7–7.7)
Neutrophils Relative %: 61 %
Platelets: 220 10*3/uL (ref 150–400)
RBC: 3.53 MIL/uL — ABNORMAL LOW (ref 3.87–5.11)
RDW: 13.9 % (ref 11.5–15.5)
WBC: 6.6 10*3/uL (ref 4.0–10.5)
nRBC: 0 % (ref 0.0–0.2)

## 2022-08-03 LAB — COMPREHENSIVE METABOLIC PANEL
ALT: 21 U/L (ref 0–44)
AST: 21 U/L (ref 15–41)
Albumin: 3.5 g/dL (ref 3.5–5.0)
Alkaline Phosphatase: 75 U/L (ref 38–126)
Anion gap: 8 (ref 5–15)
BUN: 19 mg/dL (ref 8–23)
CO2: 27 mmol/L (ref 22–32)
Calcium: 8.6 mg/dL — ABNORMAL LOW (ref 8.9–10.3)
Chloride: 102 mmol/L (ref 98–111)
Creatinine, Ser: 1.16 mg/dL — ABNORMAL HIGH (ref 0.44–1.00)
GFR, Estimated: 52 mL/min — ABNORMAL LOW (ref >60)
Glucose, Bld: 107 mg/dL — ABNORMAL HIGH (ref 70–99)
Potassium: 4 mmol/L (ref 3.5–5.1)
Sodium: 137 mmol/L (ref 135–145)
Total Bilirubin: 0.5 mg/dL (ref 0.3–1.2)
Total Protein: 7.2 g/dL (ref 6.5–8.1)

## 2022-08-03 LAB — RESP PANEL BY RT-PCR (RSV, FLU A&B, COVID)  RVPGX2
Influenza A by PCR: NEGATIVE
Influenza B by PCR: NEGATIVE
Resp Syncytial Virus by PCR: NEGATIVE
SARS Coronavirus 2 by RT PCR: NEGATIVE

## 2022-08-03 LAB — TROPONIN I (HIGH SENSITIVITY)
Troponin I (High Sensitivity): 24 ng/L — ABNORMAL HIGH (ref <18)
Troponin I (High Sensitivity): 22 ng/L — ABNORMAL HIGH (ref <18)

## 2022-08-03 MED ORDER — LEVOFLOXACIN 750 MG PO TABS: 750.0000 mg | ORAL_TABLET | Freq: Every day | ORAL | 0 refills | Status: DC

## 2022-08-03 MED ORDER — LEVOFLOXACIN 750 MG PO TABS
750.0000 mg | ORAL_TABLET | Freq: Once | ORAL | Status: AC
  Administered 2022-08-03: 750 mg via ORAL
  Filled 2022-08-03: qty 1

## 2022-08-03 MED ORDER — IOHEXOL 350 MG/ML SOLN
80.0000 mL | Freq: Once | INTRAVENOUS | Status: AC | PRN
  Administered 2022-08-03: 80 mL via INTRAVENOUS

## 2022-08-03 NOTE — ED Triage Notes (Signed)
Pt arrives pov, to triage in wheelchair, endorses being seen 1 month pta, dx with pna.. PT endorses continued shob and cough

## 2022-08-03 NOTE — ED Provider Notes (Signed)
[place] [facility] Provider Note  CSN: [id number] Arrival date & time: 08/03/22 1252  Chief Complaint(s) Shortness of Breath  HPI Joan [name] is a 67 y.o. female with history of diabetes, hypertension, prior DVT/PE on Eliquis,, sleep apnea presenting to the emergency department with cough.  Patient reports productive cough for the past week.  She was seen here in December for similar episodes and diagnosed with pneumonia, completed antibiotics but reports that symptoms may be improved a little bit but are persistent.  No fevers or chills.  No chest pain.  She reports mild shortness of breath.  No nausea or vomiting.  No back pain.  No abdominal pain.  No urinary symptoms.  Her husband has similar symptoms.   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   Class 3 severe obesity without serious comorbidity with body mass index (BMI) of 50.0 to 59.9 in adult (HCC) 01/26/2020   Cough 01/26/2020   Acute pulmonary embolism (HCC) 01/21/2020   Fall from slipping on ice 07/26/2019   Abnormal glucose 05/26/2019   Mixed hyperlipidemia 05/26/2019   Chronic pain of both knees 11/02/2018   Breast lump on left side at 4 o'clock position 06/28/2018   Functional urinary incontinence 06/28/2018   Essential hypertension 06/28/2018   OBSTRUCTIVE SLEEP APNEA 10/10/2009   Home Medication(s) Prior to Admission medications   Medication Sig Start Date End Date Taking? Authorizing Provider  levofloxacin (LEVAQUIN) 750 MG tablet Take 1 tablet (750 mg total) by mouth daily for 7 days. 08/03/22 08/10/22 Yes Lonell Grandchild, MD  albuterol (VENTOLIN HFA) 108 (90 Base) MCG/ACT inhaler Inhale 1-2 puffs into  the lungs every 6 (six) hours as needed for wheezing or shortness of breath. 06/30/22   Long, Arlyss Repress, MD  apixaban (ELIQUIS) 5 MG TABS tablet Take 1 tablet (5 mg total) by mouth 2 (two) times daily. 11/20/20   Serena Croissant, MD  ASCORBIC ACID PO Take 1 tablet by mouth daily.    [provider]  cholecalciferol (VITAMIN D3) 25 MCG (1000 UT) tablet Take 1,000 Units by mouth daily.    [provider]  CYCLOBENZAPRINE HCL PO Take 1 tablet by mouth at bedtime as needed. Patient not taking: Reported on 05/20/2022    [provider]  diclofenac sodium (VOLTAREN) 1 % GEL Apply 2 g topically 4 (four) times daily. Patient not taking: Reported on 05/20/2022 11/02/18   [name], FNP  ezetimibe-simvastatin (VYTORIN) 10-40 MG tablet Take 1 tablet by mouth daily. 04/27/20   [name], FNP  Fe Fum-FePoly-FA-Vit C-Vit B3 (INTEGRA F) 125-1 MG CAPS Take 1 tablet by mouth daily. 12/07/20   [name], FNP  GEMTESA 75 MG TABS Take 1 tablet by mouth daily.    [provider]  hydrochlorothiazide (HYDRODIURIL) 25 MG tablet Take 1 tablet (25 mg total) by mouth daily. 11/29/20   [name], FNP  Insulin Pen Needle (PEN NEEDLES) 32G X 4 MM MISC 1 each by Does not apply route daily. Use as directed daily with saxenda Patient not taking: Reported on 05/20/2022 08/03/19   [name], FNP  lidocaine (LIDODERM) 5 % Place 1 patch onto the skin  daily. Remove & Discard patch within 12 hours or as directed by MD Patient not taking: Reported on 05/20/2022 05/30/21   Palumbo, April, MD  Liraglutide -Weight Management (SAXENDA) 18 MG/3ML SOPN Inject 3 mg into the skin daily. 04/27/20   Arnette FeltsMoore, [name], FNP  lisinopril-hydrochlorothiazide (ZESTORETIC) 20-25 MG tablet Take 1 tablet by mouth daily. 11/29/20   Arnette FeltsMoore, [name], FNP  methocarbamol (ROBAXIN) 500 MG tablet Take 1 tablet (500 mg total) by mouth 2 (two) times daily. Patient not taking: Reported on 05/20/2022 05/30/21   Palumbo, April, MD   methocarbamol (ROBAXIN) 750 MG tablet Take 750 mg by mouth 2 (two) times daily as needed for muscle spasms.    [provider]  Multiple Vitamins-Minerals (MULTIVITAMIN PO) Take 1 tablet by mouth daily.    [provider]  MYRBETRIQ 50 MG TB24 tablet Take 50 mg by mouth daily. Patient not taking: Reported on 05/20/2022 11/08/19   [provider]  omeprazole (PRILOSEC) 40 MG capsule Take 40 mg by mouth daily.    [provider]  predniSONE (DELTASONE) 20 MG tablet 2 tabs po daily x 3 days Patient not taking: Reported on 05/20/2022 05/30/21   Palumbo, April, MD  solifenacin (VESICARE) 10 MG tablet Take 1 tablet by mouth once daily 08/05/19   Arnette FeltsMoore, [name], FNP  traMADol (ULTRAM) 50 MG tablet Take 1 tablet (50 mg total) by mouth every 6 (six) hours as needed. 11/20/20   Serena CroissantGudena, Vinay, MD  Vitamin D, Ergocalciferol, (DRISDOL) 1.25 MG (50000 UNIT) CAPS capsule Take 1 capsule (50,000 Units total) by mouth every 7 (seven) days. 12/07/20   Arnette FeltsMoore, [name], FNP                                                                                                                                    Past Surgical History Past Surgical History:  Procedure Laterality Date   ABDOMINAL HYSTERECTOMY     TONSILECTOMY, ADENOIDECTOMY, BILATERAL MYRINGOTOMY AND TUBES     Family History Family History  Problem Relation Age of Onset   Heart disease Mother    Heart disease Father    Prostate cancer Father     Social History Social History   Tobacco Use   Smoking status: Never   Smokeless tobacco: Never  Substance Use Topics   Alcohol use: No   Drug use: No   Allergies Penicillins  Review of Systems Review of Systems  All other systems reviewed and are negative.   Physical Exam Vital Signs  I have reviewed the triage vital signs BP (!) 186/108   Pulse 84   Temp 98.3 F (36.8 C) (Oral)   Resp 20   Wt (!) 166 kg   SpO2 98%   BMI 59.07 kg/m  Physical Exam Vitals and  nursing note reviewed.  Constitutional:      General: She is not in acute distress.    Appearance: She is well-developed. She is  obese.  HENT:     Head: Normocephalic and atraumatic.     Mouth/Throat:     Mouth: Mucous membranes are moist.  Eyes:     Pupils: Pupils are equal, round, and reactive to light.  Cardiovascular:     Rate and Rhythm: Normal rate and regular rhythm.     Heart sounds: No murmur heard. Pulmonary:     Effort: Pulmonary effort is normal. No respiratory distress.     Breath sounds: Examination of the left-middle field reveals wheezing. Wheezing present.  Abdominal:     General: Abdomen is flat.     Palpations: Abdomen is soft.     Tenderness: There is no abdominal tenderness.  Musculoskeletal:        General: No tenderness.     Right lower leg: No edema.     Left lower leg: No edema.  Skin:    General: Skin is warm and dry.  Neurological:     General: No focal deficit present.     Mental Status: She is alert. Mental status is at baseline.  Psychiatric:        Mood and Affect: Mood normal.        Behavior: Behavior normal.     ED Results and Treatments Labs (all labs ordered are listed, but only abnormal results are displayed) Labs Reviewed  COMPREHENSIVE METABOLIC PANEL - Abnormal; Notable for the following components:      Result Value   Glucose, Bld 107 (*)    Creatinine, Ser 1.16 (*)    Calcium 8.6 (*)    GFR, Estimated 52 (*)    All other components within normal limits  CBC WITH DIFFERENTIAL/PLATELET - Abnormal; Notable for the following components:   RBC 3.53 (*)    Hemoglobin 9.3 (*)    HCT 30.1 (*)    All other components within normal limits  I-STAT VENOUS BLOOD GAS, ED - Abnormal; Notable for the following components:   pO2, Ven 24 (*)    Bicarbonate 28.1 (*)    HCT 29.0 (*)    Hemoglobin 9.9 (*)    All other components within normal limits  TROPONIN I (HIGH SENSITIVITY) - Abnormal; Notable for the following components:    Troponin I (High Sensitivity) 24 (*)    All other components within normal limits  TROPONIN I (HIGH SENSITIVITY) - Abnormal; Notable for the following components:   Troponin I (High Sensitivity) 22 (*)    All other components within normal limits  RESP PANEL BY RT-PCR (RSV, FLU A&B, COVID)  RVPGX2                                                                                                                          Radiology CT Angio Chest PE W/Cm &/Or Wo Cm  Result Date: 08/03/2022 CLINICAL DATA:  Pneumonia, persistent cough and shortness of breath, clinical suspicion of pulmonary embolism EXAM: CT ANGIOGRAPHY CHEST WITH CONTRAST TECHNIQUE: Multidetector CT imaging of the chest was  performed using the standard protocol during bolus administration of intravenous contrast. Multiplanar CT image reconstructions and MIPs were obtained to evaluate the vascular anatomy. RADIATION DOSE REDUCTION: This exam was performed according to the departmental dose-optimization program which includes automated exposure control, adjustment of the mA and/or kV according to patient size and/or use of iterative reconstruction technique. CONTRAST:  17mL OMNIPAQUE IOHEXOL 350 MG/ML SOLN IV COMPARISON:  01/21/2020 FINDINGS: Cardiovascular: Aorta normal caliber without aneurysm or dissection. Question mild enlargement of cardiac chambers. No pericardial effusion. Pulmonary arteries adequately opacified and patent. No evidence of pulmonary embolism. Mediastinum/Nodes: Avis hiatal hernia. Esophagus unremarkable. Base of cervical region normal appearance. Scattered normal to minimally enlarged mediastinal lymph nodes, nonspecific. Lungs/Pleura: [place] focus of infiltrate in lingula. Mild dependent atelectasis at lung bases. No pleural effusion or pneumothorax. Upper Abdomen: Unremarkable Musculoskeletal: No osseous abnormalities. Review of the MIP images confirms the above findings. IMPRESSION: No evidence of pulmonary embolism.  Focus of infiltrate in lingula consistent with pneumonia. Hiatal hernia. Electronically Signed   By: Ulyses Southward M.D.   On: 08/03/2022 17:58   DG Chest 2 View  Result Date: 08/03/2022 CLINICAL DATA:  Recent pneumonia, continued shortness of breath and cough EXAM: CHEST - 2 VIEW COMPARISON:  06/30/2022 FINDINGS: Upper normal heart size. Mediastinal contours and pulmonary vascularity normal. Improved RIGHT basilar infiltrate though mild persistent infiltrate and atelectasis remain. Increased infiltrate in mid to lower LEFT lung. No pleural effusion or pneumothorax. No osseous abnormalities. IMPRESSION: Increased infiltrate at mid to lower LEFT lung with improved infiltrate at RIGHT base. Electronically Signed   By: Ulyses Southward M.D.   On: 08/03/2022 13:24    Pertinent labs & imaging results that were available during my care of the patient were reviewed by me and considered in my medical decision making (see MDM for details).  Medications Ordered in ED Medications  levofloxacin (LEVAQUIN) tablet 750 mg (has no administration in time range)  iohexol (OMNIPAQUE) 350 MG/ML injection 80 mL (80 mLs Intravenous Contrast Given 08/03/22 1721)                                                                                                                                     Procedures Procedures  (including critical care time)  Medical Decision Making / ED Course   MDM:  67 year old female presenting to the emergency department with productive cough.  Patient well-appearing, physical exam with trace wheezing in the left midlung field.  Chest x-ray demonstrates improved previous pneumonia but now left-sided pneumonia.  This would correlate with her physical exam.  Previous CT scan recommended obtaining repeat imaging, given possibility of recurrent pneumonia and unclear picture will obtain CTA chest to evaluate as patient also has history of PE.  Could also be caused by upper respiratory tract infection  as husband has similar cough.  Chest x-ray without evidence of pneumothorax, pneumonia.  Patient has no diffuse wheezing to suggest COPD/asthma.  No crackles to suggest pulmonary edema/CHF.  Will reassess.  If CT scan shows pneumonia given patient's clinical well appearance likely discharge with recurrent trial of outpatient antibiotics and follow-up with her pulmonologist.  Clinical Course as of 08/03/22 1955  Sat Aug 03, 2022  1744 Troponin elevated but appears elevated chronically.  Will check EKG.  Patient has no chest pain. [WS]  1953 Patient CT scan does show pneumonia.  Her curb 65 score is 1.  She should be safe for outpatient management.  Will prescribe Levaquin.  Patient does have a pulmonologist.  Advise close outpatient follow-up.  Discussed return precautions. Will discharge patient to home. All questions answered. Patient comfortable with plan of discharge. Return precautions discussed with patient and specified on the after visit summary.  [WS]    Clinical Course User Index [WS] Cristie Hem, MD     Additional history obtained: -Additional history obtained from family -External records from outside source obtained and reviewed including: Chart review including previous notes, labs, imaging, consultation notes including ED visit December for pneumonia   Lab Tests: -I ordered, reviewed, and interpreted labs.   The pertinent results include:   Labs Reviewed  COMPREHENSIVE METABOLIC PANEL - Abnormal; Notable for the following components:      Result Value   Glucose, Bld 107 (*)    Creatinine, Ser 1.16 (*)    Calcium 8.6 (*)    GFR, Estimated 52 (*)    All other components within normal limits  CBC WITH DIFFERENTIAL/PLATELET - Abnormal; Notable for the following components:   RBC 3.53 (*)    Hemoglobin 9.3 (*)    HCT 30.1 (*)    All other components within normal limits  I-STAT VENOUS BLOOD GAS, ED - Abnormal; Notable for the following components:   pO2, Ven 24 (*)     Bicarbonate 28.1 (*)    HCT 29.0 (*)    Hemoglobin 9.9 (*)    All other components within normal limits  TROPONIN I (HIGH SENSITIVITY) - Abnormal; Notable for the following components:   Troponin I (High Sensitivity) 24 (*)    All other components within normal limits  TROPONIN I (HIGH SENSITIVITY) - Abnormal; Notable for the following components:   Troponin I (High Sensitivity) 22 (*)    All other components within normal limits  RESP PANEL BY RT-PCR (RSV, FLU A&B, COVID)  RVPGX2    Notable for mild chronic troponin elevation.  No leukocytosis.  Imaging Studies ordered: I ordered imaging studies including CTA chest On my interpretation imaging demonstrates left pneumonia I independently visualized and interpreted imaging. I agree with the radiologist interpretation   Medicines ordered and prescription drug management: Meds ordered this encounter  Medications   iohexol (OMNIPAQUE) 350 MG/ML injection 80 mL   levofloxacin (LEVAQUIN) 750 MG tablet    Sig: Take 1 tablet (750 mg total) by mouth daily for 7 days.    Dispense:  7 tablet    Refill:  0   levofloxacin (LEVAQUIN) tablet 750 mg    -I have reviewed the patients home medicines and have made adjustments as needed   Social Determinants of Health:  Diagnosis or treatment significantly limited by social determinants of health: obesity   Reevaluation: After the interventions noted above, I reevaluated the patient and found that they have improved  Co morbidities that complicate the patient evaluation  Past Medical History:  Diagnosis Date   Acid reflux    Diabetes mellitus without complication ([place])    Pt states  she is prediabetic   Hypertension    OSA (obstructive sleep apnea)       Dispostion: Disposition decision including need for hospitalization was considered, and patient discharged from emergency department.    Final Clinical Impression(s) / ED Diagnoses Final diagnoses:  Community acquired  pneumonia of left lung, unspecified part of lung     This chart was dictated using voice recognition software.  Despite best efforts to proofread,  errors can occur which can change the documentation meaning.    Cristie Hem, MD 08/03/22 Karl Bales

## 2022-08-03 NOTE — Discharge Instructions (Addendum)
We evaluated you for your cough.  Your CT scan showed a new a pneumonia.  We have given you antibiotics to take for this at home.  Please follow-up closely with your pulmonologist.  Please return if you develop any difficulty breathing or worsening symptoms.

## 2022-08-04 ENCOUNTER — Ambulatory Visit (HOSPITAL_BASED_OUTPATIENT_CLINIC_OR_DEPARTMENT_OTHER): Payer: Medicare PPO | Admitting: Pulmonary Disease

## 2022-08-05 ENCOUNTER — Encounter: Payer: Self-pay | Admitting: Nurse Practitioner

## 2022-08-05 ENCOUNTER — Ambulatory Visit: Payer: Medicare PPO | Admitting: Nurse Practitioner

## 2022-08-05 VITALS — HR 83 | Ht 66.0 in | Wt 367.4 lb

## 2022-08-05 DIAGNOSIS — D649 Anemia, unspecified: Secondary | ICD-10-CM | POA: Diagnosis not present

## 2022-08-05 DIAGNOSIS — J209 Acute bronchitis, unspecified: Secondary | ICD-10-CM

## 2022-08-05 DIAGNOSIS — J189 Pneumonia, unspecified organism: Secondary | ICD-10-CM | POA: Diagnosis not present

## 2022-08-05 HISTORY — DX: Pneumonia, unspecified organism: J18.9

## 2022-08-05 HISTORY — DX: Acute bronchitis, unspecified: J20.9

## 2022-08-05 HISTORY — DX: Anemia, unspecified: D64.9

## 2022-08-05 MED ORDER — PREDNISONE 20 MG PO TABS: 40.0000 mg | ORAL_TABLET | Freq: Every day | ORAL | 0 refills | Status: AC

## 2022-08-05 MED ORDER — BENZONATATE 200 MG PO CAPS: 200.0000 mg | ORAL_CAPSULE | Freq: Three times a day (TID) | ORAL | 1 refills | Status: DC | PRN

## 2022-08-05 MED ORDER — GUAIFENESIN-DM 100-10 MG/5ML PO SYRP: 5.0000 mL | ORAL_SOLUTION | Freq: Four times a day (QID) | ORAL | 0 refills | Status: DC | PRN

## 2022-08-05 MED ORDER — LEVOFLOXACIN 750 MG PO TABS: 750.0000 mg | ORAL_TABLET | Freq: Every day | ORAL | 0 refills | Status: AC

## 2022-08-05 NOTE — Assessment & Plan Note (Addendum)
She has bronchospasm on exam and bronchitic cough. We will treat her with prednisone burst and cough control measures.

## 2022-08-05 NOTE — Assessment & Plan Note (Addendum)
Appears to have an unresolved/worsening pneumonia on imaging from 1/20. She never received levaquin course. Contacted the pharmacy and they do not have this available. We will treat her with empiric levofloxacin 7 day course - rx sent today. Plan for repeat imaging 6 weeks after she completes abx course. Continue SABA for bronchodilation. VS stable on room air and non-toxic appearing. Strict return/ED precautions.   Patient Instructions  Continue Albuterol inhaler 2 puffs every 6 hours as needed for shortness of breath or wheezing. Notify if symptoms persist despite rescue inhaler/neb use.  Guaifenesin dextromethorphan 5 mL every 6 hours for cough Benzonatate 1 capsule 3 times daily for cough Levofloxacin 750 mg once daily for 7 days.  Take with food.  Notify immediately if you develop tendon pain or 3-4 watery bowel movements. Prednisone 20 mg daily for 5 days.  Take in a.m. with food. Use over-the-counter Chloraseptic spray for sore throat  Follow-up in 2 weeks with Dr. Ander Slade or Roxan Diesel, NP. If symptoms do not improve or worsen, please contact office for sooner follow up or seek emergency care.

## 2022-08-05 NOTE — Progress Notes (Addendum)
@Patient  ID: Joan [name], female    DOB: March 25, 1956, 67 y.o.   MRN: [id number]  Chief Complaint  Patient presents with   Follow-up    Pt ED f/u she states the ED dx w/ PNA she has not started antibiotics because she didn't have the funds at the time. She is also wanting some cough syrup. She reports she has been taking mucinex in the mean time. Denies fevers    Referring provider: No ref. provider found  HPI: 67 year old female, never smoker followed for OSA not currently on CPAP. She is a patient of Dr. Judson Roch and last seen in office 05/20/2022. Past medical history significant for HTN, hx of PE and DVT on Eliquis, HLD, obesity.  She was seen in the ED on 06/30/2022 for worsening cough and congestion. She was diagnosed with pneumonia based on chest x ray and treated with 7 day course of doxycycline. She then returned 08/03/2021 with recurrent symptoms.  She had CTA of her chest with infiltrate in the lingula of the left lung, consistent with pneumonia.  She was prescribed 7-day course of levofloxacin and discharged home.  TEST/EVENTS:  06/30/2022 CXR 2 view: Airspace consolidation in the perihilar aspect of the right lung, most likely represent pneumonia.  Given the masslike fullness in the right hilar region, underlying neoplasm not excluded.  Linear opacities in the left midlung, unchanged 08/03/2022 CXR 2 view: Improved right basilar infiltrate though mild persistent infiltrate and atelectasis remain.  Increased infiltrate in mid to lower left lung. 08/03/2022 CTA chest: No evidence of PE.  [place] hiatal hernia.  Scattered normal to minimally enlarged mediastinal lymph nodes.  [place] focus of infiltrate in the lingula.  Mild dependent atelectasis at the lung bases.  08/05/2022: Today-Hospital follow-up Patient presents today for hospital follow-up with her husband.  She unfortunately was unable to start levofloxacin since she was seen in the ED on Saturday.  She could not afford this.   She tells me today that she is still experiencing a persistent, productive cough with yellow phlegm.  She feels like she has some chest discomfort with each cough and her throat has become sore.  She does feel like she is a little more short winded than she usually is.  She is also noticed wheezing.  She denies any fevers, chills, hemoptysis.  She is eating and drinking well.  She is taking Mucinex over-the-counter.  No cough suppressants.  She does have an albuterol rescue inhaler available to her, which she has used a few times a day since becoming sick.  Does feel like this helps.  Allergies  Allergen Reactions   Penicillins     REACTION: hives    Immunization History  Administered Date(s) Administered   Influenza Split 07/18/2011   Influenza,inj,Quad PF,6+ Mos 12/04/2011, 06/29/2020   PFIZER(Purple Top)SARS-COV-2 Vaccination 12/17/2019, 01/12/2020   Pneumococcal Polysaccharide-23 06/29/2020    Past Medical History:  Diagnosis Date   Acid reflux    Diabetes mellitus without complication ([place])    Pt states she is prediabetic   Hypertension    OSA (obstructive sleep apnea)     Tobacco History: Social History   Tobacco Use  Smoking Status Never  Smokeless Tobacco Never   Counseling given: Not Answered   Outpatient Medications Prior to Visit  Medication Sig Dispense Refill   albuterol (VENTOLIN HFA) 108 (90 Base) MCG/ACT inhaler Inhale 1-2 puffs into the lungs every 6 (six) hours as needed for wheezing or shortness of breath. 6.7 g  0   apixaban (ELIQUIS) 5 MG TABS tablet Take 1 tablet (5 mg total) by mouth 2 (two) times daily. 60 tablet 11   cholecalciferol (VITAMIN D3) 25 MCG (1000 UT) tablet Take 1,000 Units by mouth daily.     ezetimibe-simvastatin (VYTORIN) 10-40 MG tablet Take 1 tablet by mouth daily. 90 tablet 1   Fe Fum-FePoly-FA-Vit C-Vit B3 (INTEGRA F) 125-1 MG CAPS Take 1 tablet by mouth daily. 30 capsule 2   furosemide (LASIX) 20 MG tablet Take 20 mg by mouth  daily.     GEMTESA 75 MG TABS Take 1 tablet by mouth daily.     hydrochlorothiazide (HYDRODIURIL) 25 MG tablet Take 1 tablet (25 mg total) by mouth daily. 90 tablet 1   Insulin Pen Needle (PEN NEEDLES) 32G X 4 MM MISC 1 each by Does not apply route daily. Use as directed daily with saxenda 100 each 3   Liraglutide -Weight Management (SAXENDA) 18 MG/3ML SOPN Inject 3 mg into the skin daily. 15 mL 0   lisinopril-hydrochlorothiazide (ZESTORETIC) 20-25 MG tablet Take 1 tablet by mouth daily. 90 tablet 1   Multiple Vitamins-Minerals (MULTIVITAMIN PO) Take 1 tablet by mouth daily.     omeprazole (PRILOSEC) 40 MG capsule Take 40 mg by mouth daily.     solifenacin (VESICARE) 10 MG tablet Take 1 tablet by mouth once daily 30 tablet 0   traMADol (ULTRAM) 50 MG tablet Take 1 tablet (50 mg total) by mouth every 6 (six) hours as needed. 30 tablet    Vitamin D, Ergocalciferol, (DRISDOL) 1.25 MG (50000 UNIT) CAPS capsule Take 1 capsule (50,000 Units total) by mouth every 7 (seven) days. 12 capsule 0   ASCORBIC ACID PO Take 1 tablet by mouth daily.     CYCLOBENZAPRINE HCL PO Take 1 tablet by mouth at bedtime as needed. (Patient not taking: Reported on 05/20/2022)     diclofenac sodium (VOLTAREN) 1 % GEL Apply 2 g topically 4 (four) times daily. (Patient not taking: Reported on 05/20/2022) 100 g 2   lidocaine (LIDODERM) 5 % Place 1 patch onto the skin daily. Remove & Discard patch within 12 hours or as directed by MD (Patient not taking: Reported on 05/20/2022) 30 patch 0   methocarbamol (ROBAXIN) 500 MG tablet Take 1 tablet (500 mg total) by mouth 2 (two) times daily. (Patient not taking: Reported on 05/20/2022) 20 tablet 0   methocarbamol (ROBAXIN) 750 MG tablet Take 750 mg by mouth 2 (two) times daily as needed for muscle spasms. (Patient not taking: Reported on 08/05/2022)     MYRBETRIQ 50 MG TB24 tablet Take 50 mg by mouth daily. (Patient not taking: Reported on 05/20/2022)     levofloxacin (LEVAQUIN) 750 MG tablet  Take 1 tablet (750 mg total) by mouth daily for 7 days. (Patient not taking: Reported on 08/05/2022) 7 tablet 0   predniSONE (DELTASONE) 20 MG tablet 2 tabs po daily x 3 days (Patient not taking: Reported on 05/20/2022) 6 tablet 0   No facility-administered medications prior to visit.     Review of Systems:   Constitutional: No weight loss or gain, night sweats, fevers, chills. + fatigue, lassitude. HEENT: No headaches, difficulty swallowing, tooth/dental problems. No sneezing, itching, ear ache, nasal congestion, or post nasal drip. +sore throat CV:  No chest pain, orthopnea, PND, swelling in lower extremities, anasarca, dizziness, palpitations, syncope Resp: +shortness of breath with exertion; chest discomfort/tightness with coughing; productive cough; wheeze. No hemoptysis. No chest wall deformity GI:  No heartburn,  indigestion, abdominal pain, nausea, vomiting, diarrhea, change in bowel habits, loss of appetite, bloody stools.  GU: No dysuria, change in color of urine, urgency or frequency.   Skin: No rash, lesions, ulcerations MSK:  No joint pain or swelling.   Neuro: No dizziness or lightheadedness.  Psych: No depression or anxiety. Mood stable.     Physical Exam:  Pulse 83   Ht 5\' 6"  (1.676 m)   Wt (!) 367 lb 6.4 oz (166.7 kg)   SpO2 99%   BMI 59.30 kg/m   GEN: Pleasant, interactive, well-kempt; morbidly obese; in no acute distress. HEENT:  Normocephalic and atraumatic. PERRLA. Sclera white. Nasal turbinates pink, moist and patent bilaterally. No rhinorrhea present. Oropharynx erythematous and moist, without exudate or edema. No lesions, ulcerations NECK:  Supple w/ fair ROM. No JVD present. Normal carotid impulses w/o bruits. Thyroid symmetrical with no goiter or nodules palpated. No lymphadenopathy.   CV: RRR, no m/r/g, no peripheral edema. Pulses intact, +2 bilaterally. No cyanosis, pallor or clubbing. PULMONARY:  Unlabored, regular breathing. Scattered rhonchi with end  expiratory wheeze bilaterally A&P. No accessory muscle use.  GI: BS present and normoactive. Soft, non-tender to palpation. No organomegaly or masses detected.  MSK: No erythema, warmth or tenderness. Cap refil <2 sec all extrem. No deformities or joint swelling noted.  Neuro: A/Ox3. No focal deficits noted.   Skin: Warm, no lesions or rashe Psych: Normal affect and behavior. Judgement and thought content appropriate.     Lab Results:  CBC    Component Value Date/Time   WBC 6.6 08/03/2022 1632   RBC 3.53 (L) 08/03/2022 1632   HGB 9.9 (L) 08/03/2022 1640   HGB 10.8 (L) 11/29/2020 1654   HCT 29.0 (L) 08/03/2022 1640   HCT 34.0 11/29/2020 1654   PLT 220 08/03/2022 1632   PLT 246 11/29/2020 1654   MCV 85.3 08/03/2022 1632   MCV 83 11/29/2020 1654   MCH 26.3 08/03/2022 1632   MCHC 30.9 08/03/2022 1632   RDW 13.9 08/03/2022 1632   RDW 12.6 11/29/2020 1654   LYMPHSABS 1.8 08/03/2022 1632   LYMPHSABS 1.8 11/02/2018 1333   MONOABS 0.7 08/03/2022 1632   EOSABS 0.2 08/03/2022 1632   EOSABS 0.2 11/02/2018 1333   BASOSABS 0.0 08/03/2022 1632   BASOSABS 0.0 11/02/2018 1333    BMET    Component Value Date/Time   NA 139 08/03/2022 1640   NA 139 12/21/2021 1304   K 4.0 08/03/2022 1640   CL 102 08/03/2022 1632   CO2 27 08/03/2022 1632   GLUCOSE 107 (H) 08/03/2022 1632   BUN 19 08/03/2022 1632   BUN 24 12/21/2021 1304   CREATININE 1.16 (H) 08/03/2022 1632   CALCIUM 8.6 (L) 08/03/2022 1632   GFRNONAA 52 (L) 08/03/2022 1632   GFRAA 76 04/27/2020 1653    BNP    Component Value Date/Time   BNP 50.8 01/21/2020 1542     Imaging:  CT Angio Chest PE W/Cm &/Or Wo Cm  Result Date: 08/03/2022 CLINICAL DATA:  Pneumonia, persistent cough and shortness of breath, clinical suspicion of pulmonary embolism EXAM: CT ANGIOGRAPHY CHEST WITH CONTRAST TECHNIQUE: Multidetector CT imaging of the chest was performed using the standard protocol during bolus administration of intravenous contrast.  Multiplanar CT image reconstructions and MIPs were obtained to evaluate the vascular anatomy. RADIATION DOSE REDUCTION: This exam was performed according to the departmental dose-optimization program which includes automated exposure control, adjustment of the mA and/or kV according to patient size and/or use of  iterative reconstruction technique. CONTRAST:  65mL OMNIPAQUE IOHEXOL 350 MG/ML SOLN IV COMPARISON:  01/21/2020 FINDINGS: Cardiovascular: Aorta normal caliber without aneurysm or dissection. Question mild enlargement of cardiac chambers. No pericardial effusion. Pulmonary arteries adequately opacified and patent. No evidence of pulmonary embolism. Mediastinum/Nodes: [place] hiatal hernia. Esophagus unremarkable. Base of cervical region normal appearance. Scattered normal to minimally enlarged mediastinal lymph nodes, nonspecific. Lungs/Pleura: [place] focus of infiltrate in lingula. Mild dependent atelectasis at lung bases. No pleural effusion or pneumothorax. Upper Abdomen: Unremarkable Musculoskeletal: No osseous abnormalities. Review of the MIP images confirms the above findings. IMPRESSION: No evidence of pulmonary embolism. Focus of infiltrate in lingula consistent with pneumonia. Hiatal hernia. Electronically Signed   By: Ulyses Southward M.D.   On: 08/03/2022 17:58   DG Chest 2 View  Result Date: 08/03/2022 CLINICAL DATA:  Recent pneumonia, continued shortness of breath and cough EXAM: CHEST - 2 VIEW COMPARISON:  06/30/2022 FINDINGS: Upper normal heart size. Mediastinal contours and pulmonary vascularity normal. Improved RIGHT basilar infiltrate though mild persistent infiltrate and atelectasis remain. Increased infiltrate in mid to lower LEFT lung. No pleural effusion or pneumothorax. No osseous abnormalities. IMPRESSION: Increased infiltrate at mid to lower LEFT lung with improved infiltrate at RIGHT base. Electronically Signed   By: Ulyses Southward M.D.   On: 08/03/2022 13:24          No data to  display          No results found for: "NITRICOXIDE"      Assessment & Plan:   CAP (community acquired pneumonia) Appears to have an unresolved/worsening pneumonia on imaging from 1/20. She never received levaquin course. Contacted the pharmacy and they do not have this available. We will treat her with empiric levofloxacin 7 day course - rx sent today. Plan for repeat imaging 6 weeks after she completes abx course. Continue SABA for bronchodilation. VS stable on room air and non-toxic appearing. Strict return/ED precautions.   Patient Instructions  Continue Albuterol inhaler 2 puffs every 6 hours as needed for shortness of breath or wheezing. Notify if symptoms persist despite rescue inhaler/neb use.  Guaifenesin dextromethorphan 5 mL every 6 hours for cough Benzonatate 1 capsule 3 times daily for cough Levofloxacin 750 mg once daily for 7 days.  Take with food.  Notify immediately if you develop tendon pain or 3-4 watery bowel movements. Prednisone 20 mg daily for 5 days.  Take in a.m. with food. Use over-the-counter Chloraseptic spray for sore throat  Follow-up in 2 weeks with Dr. Wynona Neat or Rhunette Croft, NP. If symptoms do not improve or worsen, please contact office for sooner follow up or seek emergency care.   Acute bronchitis She has bronchospasm on exam and bronchitic cough. We will treat her with prednisone burst and cough control measures.   Anemia She had a drop in her hemoglobin at her ED visit from 10 in May 2023 to 9.2. No obvious signs of bleeding per her report. She is on Eliquis. Advised her to monitor her stools for any signs of potential GI bleed. Strict ED precautions. Plan to recheck CBC upon her return.    I spent 38 minutes of dedicated to the care of this patient on the date of this encounter to include pre-visit review of records, face-to-face time with the patient discussing conditions above, post visit ordering of testing, clinical documentation with the  electronic health record, making appropriate referrals as documented, and communicating necessary findings to members of the patients care team.  Ruby Cola  Magdelyn Roebuck, NP 08/05/2022  Pt aware and understands NP's role.

## 2022-08-05 NOTE — Patient Instructions (Signed)
Continue Albuterol inhaler 2 puffs every 6 hours as needed for shortness of breath or wheezing. Notify if symptoms persist despite rescue inhaler/neb use.  Guaifenesin dextromethorphan 5 mL every 6 hours for cough Benzonatate 1 capsule 3 times daily for cough Levofloxacin 750 mg once daily for 7 days.  Take with food.  Notify immediately if you develop tendon pain or 3-4 watery bowel movements. Prednisone 20 mg daily for 5 days.  Take in a.m. with food. Use over-the-counter Chloraseptic spray for sore throat  Follow-up in 2 weeks with Dr. Ander Slade or Roxan Diesel, NP. If symptoms do not improve or worsen, please contact office for sooner follow up or seek emergency care.

## 2022-08-05 NOTE — Assessment & Plan Note (Signed)
She had a drop in her hemoglobin at her ED visit from 10 in May 2023 to 9.2. No obvious signs of bleeding per her report. She is on Eliquis. Advised her to monitor her stools for any signs of potential GI bleed. Strict ED precautions. Plan to recheck CBC upon her return.

## 2022-08-08 NOTE — Telephone Encounter (Signed)
nfn 

## 2022-08-28 ENCOUNTER — Telehealth: Payer: Self-pay | Admitting: Pulmonary Disease

## 2022-08-28 DIAGNOSIS — J209 Acute bronchitis, unspecified: Secondary | ICD-10-CM

## 2022-08-28 DIAGNOSIS — J189 Pneumonia, unspecified organism: Secondary | ICD-10-CM

## 2022-08-28 NOTE — Telephone Encounter (Signed)
Patient would like the doctor or nurse to call her regarding an x-ray she said the doctor wants her to have before her sleep appt. On 2/18 at 8pm.  She stated the doctor told her she would need an x-ray before this test.  Please advise and call patient to confirm.  CB# [phone]

## 2022-08-28 NOTE — Telephone Encounter (Signed)
Spoke with patient got her scheduled for a f/u appointment with Dr. Jenetta Downer. She was supposed to see him after last appointment with [facility]. She also was due to have a repeat cxr after completing antibiotic. Order has been placed today. Patient is aware. Nothing further needed.

## 2022-09-01 ENCOUNTER — Ambulatory Visit (HOSPITAL_BASED_OUTPATIENT_CLINIC_OR_DEPARTMENT_OTHER): Payer: Medicare PPO | Attending: Pulmonary Disease | Admitting: Pulmonary Disease

## 2022-09-01 DIAGNOSIS — G4730 Sleep apnea, unspecified: Secondary | ICD-10-CM | POA: Diagnosis present

## 2022-09-01 DIAGNOSIS — Z6841 Body Mass Index (BMI) 40.0 and over, adult: Secondary | ICD-10-CM | POA: Diagnosis not present

## 2022-09-01 DIAGNOSIS — E669 Obesity, unspecified: Secondary | ICD-10-CM | POA: Diagnosis present

## 2022-09-01 DIAGNOSIS — G4733 Obstructive sleep apnea (adult) (pediatric): Secondary | ICD-10-CM | POA: Diagnosis not present

## 2022-09-01 DIAGNOSIS — G4709 Other insomnia: Secondary | ICD-10-CM | POA: Diagnosis not present

## 2022-09-01 DIAGNOSIS — G4736 Sleep related hypoventilation in conditions classified elsewhere: Secondary | ICD-10-CM | POA: Insufficient documentation

## 2022-09-01 DIAGNOSIS — R0683 Snoring: Secondary | ICD-10-CM | POA: Diagnosis present

## 2022-09-08 ENCOUNTER — Telehealth: Payer: Self-pay | Admitting: Pulmonary Disease

## 2022-09-08 DIAGNOSIS — G4733 Obstructive sleep apnea (adult) (pediatric): Secondary | ICD-10-CM

## 2022-09-08 NOTE — Telephone Encounter (Signed)
Call patient  Sleep study result  Date of study: 09/01/2022  Impression: Severe obstructive sleep apnea Severe oxygen desaturations Sleep maintenance insomnia  Recommendation: Recommend in lab titration study secondary to very severe obstructive sleep apnea with severe oxygen desaturations  Encourage aggressive weight loss measures  Follow-up as previously scheduled

## 2022-09-08 NOTE — Procedures (Signed)
POLYSOMNOGRAPHY  Last, FirstKylah, [name] MRN: [id number] Gender: <br> Age (years): 40 Weight (lbs): 368 DOB: 11-20-1955 BMI: 59 Primary Care: No PCP Epworth Score: 9 Referring: Laurin Coder MD Technician: Baxter Flattery Interpreting: Laurin Coder MD Study Type: NPSG Ordered Study Type: Split Night CPAP Study date: 09/01/2022 Location: [place] CLINICAL INFORMATION Joan [name] is a 67 year old and was referred to the sleep center for evaluation of N/A. Indications include Obesity, Snoring, Witnesses Apnea / Gasping During Sleep.  MEDICATIONS Patient self administered medications include: N/A. Medications administered during study include No sleep medicine administered.  SLEEP STUDY TECHNIQUE A multi-channel overnight Polysomnography study was performed. The channels recorded and monitored were central and occipital EEG, electrooculogram (EOG), submentalis EMG (chin), nasal and oral airflow, thoracic and abdominal wall motion, anterior tibialis EMG, snore microphone, electrocardiogram, and a pulse oximetry. TECHNICIAN COMMENTS Comments added by Technician: Patient had difficulty initiating sleep. Patient was restless all through the night. Comments added by Scorer: N/A SLEEP ARCHITECTURE The study was initiated at 11:06:43 PM and terminated at 5:07:40 AM. The total recorded time was 360.9 minutes. EEG confirmed total sleep time was 167 minutes yielding a sleep efficiency of 46.3%. Sleep onset after lights out was 11.5 minutes with a REM latency of N/A minutes. The patient spent 2.1% of the night in stage N1 sleep, 97.9% in stage N2 sleep, 0.0% in stage N3 and 0% in REM. Wake after sleep onset (WASO) was 182.4 minutes. The Arousal Index was 2.5/hour. RESPIRATORY PARAMETERS There were a total of 157 respiratory disturbances out of which 66 were apneas ( 66 obstructive, 0 mixed, 0 central) and 91 hypopneas. The apnea/hypopnea index (AHI) was 56.4 events/hour. The  central sleep apnea index was 0 events/hour. The REM AHI was N/A events/hour and NREM AHI was 56.4 events/hour. The supine AHI was N/A events/hour and the non supine AHI was 56.4 supine during 0.00% of sleep. Respiratory disturbances were associated with oxygen desaturation down to a nadir of 65.0% during sleep. The mean oxygen saturation during the study was 90.1%. The cumulative time under 88% oxygen saturation was 5.5 minutes.  LEG MOVEMENT DATA The total leg movements were 4 with a resulting leg movement index of 1.4/hr .Associated arousal with leg movement index was 0.0/hr.  CARDIAC DATA The underlying cardiac rhythm was most consistent with sinus rhythm. Mean heart rate during sleep was 79.4 bpm. Additional rhythm abnormalities include None.  IMPRESSIONS - Severe Obstructive Sleep apnea(OSA) - EKG showed no cardiac abnormalities. - Severe Oxygen Desaturation - The patient snored with moderate snoring volume. - No significant periodic leg movements(PLMs) during sleep. However, no significant associated arousals.  DIAGNOSIS - Severe Obstructive Sleep Apnea (G47.33) - Nocturnal Hypoxemia (G47.36) - Sleep maintenance insomnia  RECOMMENDATIONS - Therapeutic CPAP titration to determine optimal pressure required to alleviate sleep disordered breathing. - Avoid alcohol, sedatives and other CNS depressants that may worsen sleep apnea and disrupt normal sleep architecture. - Sleep hygiene should be reviewed to assess factors that may improve sleep quality. - Weight management and regular exercise should be initiated or continued.  [Electronically signed] 09/08/2022 03:18 PM  Sherrilyn Rist MD NPI: [id number]

## 2022-09-10 NOTE — Telephone Encounter (Signed)
Patient is returning phone call. Patient phone number is [phone].

## 2022-09-10 NOTE — Telephone Encounter (Signed)
ATC pt unable to LVM

## 2022-09-12 ENCOUNTER — Encounter: Payer: Self-pay | Admitting: *Deleted

## 2022-09-12 NOTE — Telephone Encounter (Signed)
Called the pt, no answer and VM still full  Mailed letter to call back for results

## 2022-09-13 ENCOUNTER — Ambulatory Visit: Payer: Medicare PPO | Admitting: Pulmonary Disease

## 2022-09-18 ENCOUNTER — Ambulatory Visit (INDEPENDENT_AMBULATORY_CARE_PROVIDER_SITE_OTHER): Payer: Medicare PPO | Admitting: Pulmonary Disease

## 2022-09-18 ENCOUNTER — Encounter: Payer: Self-pay | Admitting: Pulmonary Disease

## 2022-09-18 ENCOUNTER — Ambulatory Visit (INDEPENDENT_AMBULATORY_CARE_PROVIDER_SITE_OTHER): Payer: Medicare PPO

## 2022-09-18 VITALS — BP 152/88 | HR 74 | Ht 66.0 in | Wt 370.0 lb

## 2022-09-18 DIAGNOSIS — J189 Pneumonia, unspecified organism: Secondary | ICD-10-CM

## 2022-09-18 DIAGNOSIS — J209 Acute bronchitis, unspecified: Secondary | ICD-10-CM | POA: Diagnosis not present

## 2022-09-18 DIAGNOSIS — G4733 Obstructive sleep apnea (adult) (pediatric): Secondary | ICD-10-CM

## 2022-09-18 NOTE — Addendum Note (Signed)
Addended by: June Leap on: 09/18/2022 04:04 PM   Modules accepted: Orders

## 2022-09-18 NOTE — Progress Notes (Signed)
Joan [name]    MC:[id number]    11-22-1955  Primary Care Physician:Pcp, No  Referring Physician: No referring provider defined for this encounter.  Chief complaint:   Patient being seen for obstructive sleep apnea  HPI:  Recently treated for pneumonia  She is feeling relatively well  Had a sleep study done recently showing severe obstructive sleep apnea with severe oxygen desaturations Has not had a titration study yet  Breathing has been steady  Denies any cough or significant chest pain or discomfort  She is limited with activities of daily living She does have significant pain and discomfort in back and knee  Some fluid retention  History of obstructive sleep apnea for which she was on CPAP previously  Having increased symptoms of daytime sleepiness and fatigue Continuing weight gain  She tolerated CPAP well without significant difficulty when she got used to using it  Outpatient Encounter Medications as of 09/18/2022  Medication Sig   albuterol (VENTOLIN HFA) 108 (90 Base) MCG/ACT inhaler Inhale 1-2 puffs into the lungs every 6 (six) hours as needed for wheezing or shortness of breath.   apixaban (ELIQUIS) 5 MG TABS tablet Take 1 tablet (5 mg total) by mouth 2 (two) times daily.   ASCORBIC ACID PO Take 1 tablet by mouth daily.   benzonatate (TESSALON) 200 MG capsule Take 1 capsule (200 mg total) by mouth 3 (three) times daily as needed for cough.   cholecalciferol (VITAMIN D3) 25 MCG (1000 UT) tablet Take 1,000 Units by mouth daily.   CYCLOBENZAPRINE HCL PO Take 1 tablet by mouth at bedtime as needed.   diclofenac sodium (VOLTAREN) 1 % GEL Apply 2 g topically 4 (four) times daily.   ezetimibe-simvastatin (VYTORIN) 10-40 MG tablet Take 1 tablet by mouth daily.   Fe Fum-FePoly-FA-Vit C-Vit B3 (INTEGRA F) 125-1 MG CAPS Take 1 tablet by mouth daily.   furosemide (LASIX) 20 MG tablet Take 20 mg by mouth daily.   GEMTESA 75 MG TABS Take 1 tablet by mouth  daily.   guaiFENesin-dextromethorphan (ROBITUSSIN DM) 100-10 MG/5ML syrup Take 5 mLs by mouth every 6 (six) hours as needed for cough.   hydrochlorothiazide (HYDRODIURIL) 25 MG tablet Take 1 tablet (25 mg total) by mouth daily.   Insulin Pen Needle (PEN NEEDLES) 32G X 4 MM MISC 1 each by Does not apply route daily. Use as directed daily with saxenda   lidocaine (LIDODERM) 5 % Place 1 patch onto the skin daily. Remove & Discard patch within 12 hours or as directed by MD   Liraglutide -Weight Management (SAXENDA) 18 MG/3ML SOPN Inject 3 mg into the skin daily.   lisinopril-hydrochlorothiazide (ZESTORETIC) 20-25 MG tablet Take 1 tablet by mouth daily.   methocarbamol (ROBAXIN) 500 MG tablet Take 1 tablet (500 mg total) by mouth 2 (two) times daily.   methocarbamol (ROBAXIN) 750 MG tablet Take 750 mg by mouth 2 (two) times daily as needed for muscle spasms.   Multiple Vitamins-Minerals (MULTIVITAMIN PO) Take 1 tablet by mouth daily.   omeprazole (PRILOSEC) 40 MG capsule Take 40 mg by mouth daily.   solifenacin (VESICARE) 10 MG tablet Take 1 tablet by mouth once daily   traMADol (ULTRAM) 50 MG tablet Take 1 tablet (50 mg total) by mouth every 6 (six) hours as needed.   Vitamin D, Ergocalciferol, (DRISDOL) 1.25 MG (50000 UNIT) CAPS capsule Take 1 capsule (50,000 Units total) by mouth every 7 (seven) days.   MYRBETRIQ 50 MG  TB24 tablet Take 50 mg by mouth daily. (Patient not taking: Reported on 09/18/2022)   No facility-administered encounter medications on file as of 09/18/2022.    Allergies as of 09/18/2022 - Review Complete 09/18/2022  Allergen Reaction Noted   Penicillins Other (See Comments) 09/18/2022    Past Medical History:  Diagnosis Date   Acid reflux    Diabetes mellitus without complication (HCC)    Pt states she is prediabetic   Hypertension    OSA (obstructive sleep apnea)     Past Surgical History:  Procedure Laterality Date   ABDOMINAL HYSTERECTOMY     TONSILECTOMY,  ADENOIDECTOMY, BILATERAL MYRINGOTOMY AND TUBES      Family History  Problem Relation Age of Onset   Heart disease Mother    Heart disease Father    Prostate cancer Father     Social History   Socioeconomic History   Marital status: Single    Spouse name: Not on file   Number of children: Not on file   Years of education: Not on file   Highest education level: Not on file  Occupational History   Occupation: CNA  Tobacco Use   Smoking status: Never   Smokeless tobacco: Never  Substance and Sexual Activity   Alcohol use: No   Drug use: No   Sexual activity: Not on file  Other Topics Concern   Not on file  Social History Narrative   ** Merged History Encounter **       Social Determinants of Health   Financial Resource Strain: Not on file  Food Insecurity: Not on file  Transportation Needs: Not on file  Physical Activity: Not on file  Stress: Not on file  Social Connections: Not on file  Intimate Partner Violence: Not on file    Review of Systems  Respiratory:  Positive for apnea and shortness of breath.   Psychiatric/Behavioral:  Positive for sleep disturbance.     Vitals:   09/18/22 1445  BP: (!) 152/88  Pulse: 74  SpO2: 95%     Physical Exam Constitutional:      Appearance: She is obese.  HENT:     Head: Normocephalic.     Mouth/Throat:     Mouth: Mucous membranes are moist.  Eyes:     General: No scleral icterus.    Pupils: Pupils are equal, round, and reactive to light.  Cardiovascular:     Rate and Rhythm: Normal rate and regular rhythm.     Heart sounds: No murmur heard.    No friction rub.  Pulmonary:     Effort: No respiratory distress.     Breath sounds: No stridor. No wheezing or rhonchi.  Musculoskeletal:        General: Swelling present.     Cervical back: No rigidity or tenderness.  Neurological:     Mental Status: She is alert.  Psychiatric:        Mood and Affect: Mood normal.    Data Reviewed: Previous records  reviewed  Recent sleep study was discussed with the patient showing severe obstructive sleep apnea with severe oxygen desaturations  Most recent chest x-ray shows improvement in left lung infiltrate  Assessment:  Severe obstructive sleep apnea  Recent pneumonia -Symptoms have resolved  Class III obesity  Pathophysiology and options of treatment for sleep disordered breathing discussed with the patient   Plan/Recommendations: Schedule patient for titration study  Graded activities as tolerated  Encouraged to get more active  I will see her back  in about 6 weeks    Sherrilyn Rist MD [facility] 09/18/2022, 3:16 PM  CC: No ref. provider found

## 2022-09-18 NOTE — Patient Instructions (Signed)
Graded exercise as tolerated  Weight loss efforts  Your chest x-ray shows the area of pneumonia has cleared  We will schedule you for a CPAP titration study  I will see you back in about 6 weeks

## 2022-12-18 ENCOUNTER — Encounter (HOSPITAL_BASED_OUTPATIENT_CLINIC_OR_DEPARTMENT_OTHER): Payer: Medicare PPO | Admitting: Pulmonary Disease

## 2022-12-22 ENCOUNTER — Ambulatory Visit (HOSPITAL_BASED_OUTPATIENT_CLINIC_OR_DEPARTMENT_OTHER): Payer: Medicare PPO | Attending: Pulmonary Disease | Admitting: Pulmonary Disease

## 2022-12-22 DIAGNOSIS — G4733 Obstructive sleep apnea (adult) (pediatric): Secondary | ICD-10-CM | POA: Insufficient documentation

## 2023-01-05 ENCOUNTER — Telehealth: Payer: Self-pay | Admitting: Pulmonary Disease

## 2023-01-05 DIAGNOSIS — G4733 Obstructive sleep apnea (adult) (pediatric): Secondary | ICD-10-CM

## 2023-01-05 NOTE — Procedures (Signed)
POLYSOMNOGRAPHY  Last, FirstGlory, [name] MRN: [id number] Gender: <br> Age (years): 67 Weight (lbs): 370 DOB: 31-Aug-1955 BMI: 59 Primary Care: No PCP Epworth Score: <br> Referring: Tomma Lightning MD Technician: Cherylann Parr Interpreting: Tomma Lightning MD Study Type: CPAP Ordered Study Type: CPAP Study date: 12/22/2022 Location: [place] CLINICAL INFORMATION Joan [name] is a 67 year old and was referred to the sleep center for evaluation of N/A. Indications include Obesity, Snoring, Weight Gain, Witnesses Apnea / Gasping During Sleep.   Most recent polysomnogram was on 09/01/2022. Most recent titration study was on 12/22/2022. MEDICATIONS Patient self administered medications include: N/A. Medications administered during study include No sleep medicine administered.  SLEEP STUDY TECHNIQUE The patient underwent an attended overnight polysomnography titration to assess the effects of CPAP therapy. The following variables were monitored: EEG(C4-A1, C3-A2, O1-A2, O2-A1), EOG, submental and leg EMG, ECG, oxyhemoglobin saturation by pulse oximetry, thoracic and abdominal respiratory effort belts, nasal/oral airflow by pressure sensor, body position sensor and snoring sensor. CPAP pressure was titrated to eliminate apneas, hypopneas and oxygen desaturation. Hypopneas were scored per AASM definition IB (4% desaturation)  TECHNICIAN COMMENTS Comments added by Technician: Patient had difficulty initiating sleep. Comments added by Scorer: N/A SLEEP ARCHITECTURE The study was initiated at 10:24:48 PM and terminated at 4:29:20 AM. Total recorded time was 364.5 minutes. EEG confirmed total sleep time was 319.9 minutes yielding a sleep efficiency of 87.8%. Sleep onset after lights out was 1.1 minutes with a REM latency of 63.5 minutes. The patient spent 0.6% of the night in stage N1 sleep, 83.7% in stage N2 sleep, 0.0% in stage N3 and 15.6% in REM. The Arousal Index was  7.1/hour. RESPIRATORY PARAMETERS The overall AHI was 9.9 per hour, and the RDI was 10.3 events/hour with a central apnea index of 0.9per hour. The most appropriate setting of CPAP was 15 cm H2O. At this setting, the sleep efficiency was 96% and the patient was supine for 100%. The AHI was 5.6 events per hour, and the RDI was 5.6 events/hour (with 0.9 central events) and the arousal index was 8.4 per hour.The oxygen nadir was 91.0% during sleep.    LEG MOVEMENT DATA The total leg movements were 0 with a resulting leg movement index of 0.0/hr. Associated arousal with leg movement index was 0.0/hr. CARDIAC DATA The underlying cardiac rhythm was most consistent with sinus rhythm. Mean heart rate during sleep was 73.4 bpm. Additional rhythm abnormalities include None.  IMPRESSIONS - EKG showed no cardiac abnormalities. - No snoring was audible during this study. - Severe Oxygen Desaturation - Mild Obstructive Sleep apnea(OSA) Optimal pressure attained. - No Significant Central Sleep Apnea (CSA) - No significant periodic leg movements(PLMs) during sleep. However, no significant associated arousals. - Normal sleep efficiency, short primary sleep latency, short REM sleep latency and long slow wave latency.  DIAGNOSIS - Obstructive Sleep Apnea (G47.33)  RECOMMENDATIONS - Trial of CPAP therapy on 13 cm H2O with a [place] size Fisher&Paykel Full Face Simplus mask and heated humidification. - Avoid alcohol, sedatives and other CNS depressants that may worsen sleep apnea and disrupt normal sleep architecture. - Sleep hygiene should be reviewed to assess factors that may improve sleep quality. - Weight management and regular exercise should be initiated or continued. - Return to Sleep Center for re-evaluation after 4 weeks of therapy  [Electronically signed] 01/05/2023 08:00 AM  Virl Diamond MD NPI: [id number]

## 2023-01-05 NOTE — Telephone Encounter (Signed)
Call patient  Sleep study result  Date of study: 12/22/2022  Impression: Severe obstructive sleep apnea  Recommendation: DME referral  Trial of CPAP therapy on 13 cm H2O with a [place] size Fisher&Paykel Full Face Simplus mask and heated humidification.  Encourage weight loss measures  Follow-up in the office 4 to 6 weeks following initiation of treatment

## 2023-01-06 ENCOUNTER — Other Ambulatory Visit: Payer: Self-pay

## 2023-01-06 DIAGNOSIS — G4733 Obstructive sleep apnea (adult) (pediatric): Secondary | ICD-10-CM

## 2023-01-06 NOTE — Telephone Encounter (Signed)
Spoke with patient regarding sleep study result's   Sleep study result   Date of study: 12/22/2022   Impression: Severe obstructive sleep apnea   Recommendation: DME referral   Trial of CPAP therapy on 13 cm H2O with a [place] size Fisher&Paykel Full Face Simplus mask and heated humidification.   Encourage weight loss measures   Follow-up in the office 4 to 6 weeks following initiation of treatment    Order was placed for CPAP.  Patient does have a follow up and would like to talk about inspire.  Patient's voice was understanding.Nothing else further needed.

## 2023-01-17 ENCOUNTER — Telehealth: Payer: Self-pay | Admitting: Pulmonary Disease

## 2023-01-17 ENCOUNTER — Other Ambulatory Visit: Payer: Self-pay | Admitting: Pulmonary Disease

## 2023-01-17 MED ORDER — AZITHROMYCIN 250 MG PO TABS: ORAL_TABLET | ORAL | 0 refills | Status: DC

## 2023-01-17 MED ORDER — PREDNISONE 20 MG PO TABS: 40.0000 mg | ORAL_TABLET | Freq: Every day | ORAL | 0 refills | Status: DC

## 2023-01-17 NOTE — Telephone Encounter (Signed)
Pt. Is sick coughing up phlem and asking if Dr. Evaristo Bury send in some meds to her pharmacy her cell #[phone]

## 2023-01-17 NOTE — Telephone Encounter (Signed)
Prescription for prednisone and azithromycin sent to pharmacy

## 2023-01-17 NOTE — Telephone Encounter (Signed)
Spoke with patient. Advised prednisone and antibiotic have been sent to pharmacy. NFN

## 2023-01-17 NOTE — Telephone Encounter (Signed)
Spoke with patient. She complains of productive cough with yellowish/green phlegm, runny nose and nasal congestion Symptoms started Sunday, no fever  Patient has take OTC cough syrup/ tablets  Pharmacy [facility] [place] [facility] Patient is requesting prednisone and antibiotic if possible   Dr. Val Eagle please advise?

## 2023-03-05 ENCOUNTER — Encounter: Payer: Self-pay | Admitting: Pulmonary Disease

## 2023-03-05 ENCOUNTER — Ambulatory Visit: Payer: Medicare PPO | Admitting: Pulmonary Disease

## 2023-03-05 VITALS — BP 136/78 | HR 88 | Temp 98.0°F | Ht 66.0 in | Wt 356.0 lb

## 2023-03-05 DIAGNOSIS — G4733 Obstructive sleep apnea (adult) (pediatric): Secondary | ICD-10-CM

## 2023-03-05 NOTE — Progress Notes (Signed)
Joan [name]    [id number]    01/29/1956  Primary Care Physician:Pcp, No  Referring Physician: No referring provider defined for this encounter.  Chief complaint:   Patient being seen for obstructive sleep apnea  HPI:  Recently had a sleep study done showing severe obstructive sleep apnea with severe oxygen desaturations, had a titration study revealing to require CPAP of 13  She declined CPAP therapy and wanted to talk about an inspire device  I did inform her about the weight limitation with an inspire device during the visit today She is willing to at least give CPAP a try  Breathing has been steady Still does have a cough from a recent respiratory infection  She is limited with activities of daily living She does have significant pain and discomfort in back and knee  Some fluid retention  History of obstructive sleep apnea for which she was on CPAP previously  Having increased symptoms of daytime sleepiness and fatigue Continuing weight gain  She tolerated CPAP well without significant difficulty when she got used to using it previously  Outpatient Encounter Medications as of 03/05/2023  Medication Sig   albuterol (VENTOLIN HFA) 108 (90 Base) MCG/ACT inhaler Inhale 1-2 puffs into the lungs every 6 (six) hours as needed for wheezing or shortness of breath.   apixaban (ELIQUIS) 5 MG TABS tablet Take 1 tablet (5 mg total) by mouth 2 (two) times daily.   ASCORBIC ACID PO Take 1 tablet by mouth daily.   benzonatate (TESSALON) 200 MG capsule Take 1 capsule (200 mg total) by mouth 3 (three) times daily as needed for cough.   cholecalciferol (VITAMIN D3) 25 MCG (1000 UT) tablet Take 1,000 Units by mouth daily.   CYCLOBENZAPRINE HCL PO Take 1 tablet by mouth at bedtime as needed.   diclofenac sodium (VOLTAREN) 1 % GEL Apply 2 g topically 4 (four) times daily.   ezetimibe-simvastatin (VYTORIN) 10-40 MG tablet Take 1 tablet by mouth daily.   Fe  Fum-FePoly-FA-Vit C-Vit B3 (INTEGRA F) 125-1 MG CAPS Take 1 tablet by mouth daily.   furosemide (LASIX) 20 MG tablet Take 20 mg by mouth daily.   GEMTESA 75 MG TABS Take 1 tablet by mouth daily.   guaiFENesin-dextromethorphan (ROBITUSSIN DM) 100-10 MG/5ML syrup Take 5 mLs by mouth every 6 (six) hours as needed for cough.   hydrochlorothiazide (HYDRODIURIL) 25 MG tablet Take 1 tablet (25 mg total) by mouth daily.   Insulin Pen Needle (PEN NEEDLES) 32G X 4 MM MISC 1 each by Does not apply route daily. Use as directed daily with saxenda   lidocaine (LIDODERM) 5 % Place 1 patch onto the skin daily. Remove & Discard patch within 12 hours or as directed by MD   Liraglutide -Weight Management (SAXENDA) 18 MG/3ML SOPN Inject 3 mg into the skin daily.   lisinopril-hydrochlorothiazide (ZESTORETIC) 20-25 MG tablet Take 1 tablet by mouth daily.   methocarbamol (ROBAXIN) 500 MG tablet Take 1 tablet (500 mg total) by mouth 2 (two) times daily.   methocarbamol (ROBAXIN) 750 MG tablet Take 750 mg by mouth 2 (two) times daily as needed for muscle spasms.   Multiple Vitamins-Minerals (MULTIVITAMIN PO) Take 1 tablet by mouth daily.   MYRBETRIQ 50 MG TB24 tablet Take 50 mg by mouth daily.   omeprazole (PRILOSEC) 40 MG capsule Take 40 mg by mouth daily.   solifenacin (VESICARE) 10 MG tablet Take 1 tablet by mouth once daily   traMADol (ULTRAM)  50 MG tablet Take 1 tablet (50 mg total) by mouth every 6 (six) hours as needed.   Vitamin D, Ergocalciferol, (DRISDOL) 1.25 MG (50000 UNIT) CAPS capsule Take 1 capsule (50,000 Units total) by mouth every 7 (seven) days.   azithromycin (ZITHROMAX Z-PAK) 250 MG tablet Take 2 tablets day 1 and then 1 daily for 4 days (Patient not taking: Reported on 03/05/2023)   predniSONE (DELTASONE) 20 MG tablet Take 2 tablets (40 mg total) by mouth daily with breakfast. (Patient not taking: Reported on 03/05/2023)   No facility-administered encounter medications on file as of 03/05/2023.     Allergies as of 03/05/2023 - Review Complete 03/05/2023  Allergen Reaction Noted   Penicillins Other (See Comments) 09/18/2022    Past Medical History:  Diagnosis Date   Acid reflux    Diabetes mellitus without complication (HCC)    Pt states she is prediabetic   Hypertension    OSA (obstructive sleep apnea)     Past Surgical History:  Procedure Laterality Date   ABDOMINAL HYSTERECTOMY     TONSILECTOMY, ADENOIDECTOMY, BILATERAL MYRINGOTOMY AND TUBES      Family History  Problem Relation Age of Onset   Heart disease Mother    Heart disease Father    Prostate cancer Father     Social History   Socioeconomic History   Marital status: Single    Spouse name: Not on file   Number of children: Not on file   Years of education: Not on file   Highest education level: Not on file  Occupational History   Occupation: CNA  Tobacco Use   Smoking status: Never   Smokeless tobacco: Never  Substance and Sexual Activity   Alcohol use: No   Drug use: No   Sexual activity: Not on file  Other Topics Concern   Not on file  Social History Narrative   ** Merged History Encounter **       Social Determinants of Health   Financial Resource Strain: Low Risk  (10/02/2022)   Received from [facility], [facility]   Overall Financial Resource Strain (CARDIA)    Difficulty of Paying Living Expenses: Not hard at all  Food Insecurity: No Food Insecurity (11/27/2022)   Received from [facility], [facility]   Hunger Vital Sign    Worried About Running Out of Food in the Last Year: Never true    Ran Out of Food in the Last Year: Never true  Transportation Needs: No Transportation Needs (11/27/2022)   Received from [facility], [facility]   PRAPARE - Transportation    Lack of Transportation (Medical): No    Lack of Transportation (Non-Medical): No  Physical Activity: Not on file  Stress: Not on file  Social Connections: Not on file  Intimate Partner  Violence: Not At Risk (11/27/2022)   Received from [facility], [facility]   Humiliation, Afraid, Rape, and Kick questionnaire    Fear of Current or Ex-Partner: No    Emotionally Abused: No    Physically Abused: No    Sexually Abused: No    Review of Systems  Respiratory:  Positive for apnea and shortness of breath.   Psychiatric/Behavioral:  Positive for sleep disturbance.     Vitals:   03/05/23 1557  BP: 136/78  Pulse: 88  Temp: 98 F (36.7 C)  SpO2: 93%     Physical Exam Constitutional:      Appearance: She is obese.  HENT:     Head: Normocephalic.     Mouth/Throat:     Mouth: Mucous membranes are moist.  Eyes:     General: No scleral icterus.    Pupils: Pupils are equal, round, and reactive to light.  Cardiovascular:     Rate and Rhythm: Normal rate and regular rhythm.     Heart sounds: No murmur heard.    No friction rub.  Pulmonary:     Effort: No respiratory distress.     Breath sounds: No stridor. No wheezing or rhonchi.  Musculoskeletal:        General: Swelling present.     Cervical back: No rigidity or tenderness.  Neurological:     Mental Status: She is alert.  Psychiatric:        Mood and Affect: Mood normal.    Data Reviewed: Previous records reviewed  Recent sleep study was discussed with the patient showing severe obstructive sleep apnea with severe oxygen desaturations  Most recent sleep study reviewed Titration study reviewed  Assessment:  Severe obstructive sleep apnea  Cough  Class III obesity  Pathophysiology and options of treatment for sleep disordered breathing discussed with the patient  Qualification rules for hypoglossal nerve stimulation discussed with her today   Plan/Recommendations: DME referral for CPAP  CPAP of 13 with patient's mask of choice  Encouraged to call with significant concerns  Encouraged to continue to focus on weight loss efforts  Virl Diamond MD [facility] 03/05/2023, 4:16 PM  CC: No ref. provider found

## 2023-03-05 NOTE — Addendum Note (Signed)
Addended by: Lanna Poche on: 03/05/2023 04:44 PM   Modules accepted: Orders

## 2023-03-05 NOTE — Patient Instructions (Addendum)
DME referral for CPAP  Trial of CPAP therapy on 13 cm H2O with a [place] size Fisher[employer]Paykel Full Face Simplus mask and heated humidification.   I will see you in about 3 months  Call us with significant concerns  To qualify for inspire device, you will need to continue to focus on weight loss efforts

## 2023-04-04 DIAGNOSIS — R2689 Other abnormalities of gait and mobility: Secondary | ICD-10-CM

## 2023-04-04 HISTORY — DX: Other abnormalities of gait and mobility: R26.89

## 2023-05-20 ENCOUNTER — Telehealth: Payer: Self-pay | Admitting: Pulmonary Disease

## 2023-05-20 NOTE — Telephone Encounter (Signed)
Patient needs a z-pack to be called in for her sickness. She has congestion and yellow mucus.  [facility]er, site on [address] in [place]   Or  [facility].

## 2023-05-21 NOTE — Telephone Encounter (Signed)
Spoke with patient regarding prior message . Patient stated she started to cough,Nasal congestion and chest congestion.0 fever . Patient would like antibiotics and prednisone call into her pharmacy. [facility] [place].  Dr.Olalere can you please advise .  Thank you

## 2023-05-25 ENCOUNTER — Other Ambulatory Visit: Payer: Self-pay | Admitting: Pulmonary Disease

## 2023-05-25 MED ORDER — AZITHROMYCIN 250 MG PO TABS: ORAL_TABLET | ORAL | 0 refills | Status: DC

## 2023-05-25 MED ORDER — PREDNISONE 20 MG PO TABS: 20.0000 mg | ORAL_TABLET | Freq: Every day | ORAL | 0 refills | Status: DC

## 2023-05-25 NOTE — Telephone Encounter (Signed)
Azithromycin and prednisone called to pharmacy.

## 2023-05-26 NOTE — Telephone Encounter (Signed)
Spoke to patient. She stated that she is going to pickup Rx from [facility].  Nothing further needed.

## 2023-05-26 NOTE — Telephone Encounter (Signed)
Lm x1 for patient.   Medication can only be sent to one pharmacy.

## 2023-05-26 NOTE — Telephone Encounter (Signed)
Pt is aware rx's were sent to [facility]. She is asking if it can also be sent to [facility] shared pharmacy. That is where she gets meds when she can't afford them.

## 2023-05-26 NOTE — Telephone Encounter (Signed)
Patient is going to get the medication at [facility]. Patient phone number is [phone].

## 2023-05-26 NOTE — Telephone Encounter (Signed)
Lm x1 for patient.  

## 2023-05-27 ENCOUNTER — Ambulatory Visit: Payer: Medicare PPO | Admitting: Primary Care

## 2023-05-27 NOTE — Progress Notes (Deleted)
@Patient  ID: Joan [name], female    DOB: 07/09/1956, 67 y.o.   MRN: [id number]  No chief complaint on file.   Referring provider: No ref. provider found  HPI: 67 year old female, never smoked.  Past medical history significant for hypertension, DVT/PE, sleep apnea, pneumonia, obesity.  Patient of Dr. Wynona Neat, last seen in August 2024.  05/27/2023 Patient presents today for follow-up for OSA. During patient's last office visit in August she was started on CPAP pressure setting of 13cm h20\ for severe sleep apnea with severe oxygen desaturations      Allergies  Allergen Reactions   Penicillins Other (See Comments)    REACTION: hives    Immunization History  Administered Date(s) Administered   Influenza Split 07/18/2011   Influenza,inj,Quad PF,6+ Mos 12/04/2011, 06/29/2020   PFIZER(Purple Top)SARS-COV-2 Vaccination 12/17/2019, 01/12/2020   Pneumococcal Polysaccharide-23 06/29/2020    Past Medical History:  Diagnosis Date   Acid reflux    Diabetes mellitus without complication (HCC)    Pt states she is prediabetic   Hypertension    OSA (obstructive sleep apnea)     Tobacco History: Social History   Tobacco Use  Smoking Status Never  Smokeless Tobacco Never   Counseling given: Not Answered   Outpatient Medications Prior to Visit  Medication Sig Dispense Refill   albuterol (VENTOLIN HFA) 108 (90 Base) MCG/ACT inhaler Inhale 1-2 puffs into the lungs every 6 (six) hours as needed for wheezing or shortness of breath. 6.7 g 0   apixaban (ELIQUIS) 5 MG TABS tablet Take 1 tablet (5 mg total) by mouth 2 (two) times daily. 60 tablet 11   ASCORBIC ACID PO Take 1 tablet by mouth daily.     azithromycin (ZITHROMAX Z-PAK) 250 MG tablet Take 2 tablets day 1 and then 1 daily for 4 days (Patient not taking: Reported on 03/05/2023) 6 each 0   azithromycin (ZITHROMAX Z-PAK) 250 MG tablet Take 2 tablets day 1 and then 1 daily for 4 days 6 each 0   benzonatate (TESSALON) 200  MG capsule Take 1 capsule (200 mg total) by mouth 3 (three) times daily as needed for cough. 30 capsule 1   cholecalciferol (VITAMIN D3) 25 MCG (1000 UT) tablet Take 1,000 Units by mouth daily.     CYCLOBENZAPRINE HCL PO Take 1 tablet by mouth at bedtime as needed.     diclofenac sodium (VOLTAREN) 1 % GEL Apply 2 g topically 4 (four) times daily. 100 g 2   ezetimibe-simvastatin (VYTORIN) 10-40 MG tablet Take 1 tablet by mouth daily. 90 tablet 1   Fe Fum-FePoly-FA-Vit C-Vit B3 (INTEGRA F) 125-1 MG CAPS Take 1 tablet by mouth daily. 30 capsule 2   furosemide (LASIX) 20 MG tablet Take 20 mg by mouth daily.     GEMTESA 75 MG TABS Take 1 tablet by mouth daily.     guaiFENesin-dextromethorphan (ROBITUSSIN DM) 100-10 MG/5ML syrup Take 5 mLs by mouth every 6 (six) hours as needed for cough. 118 mL 0   hydrochlorothiazide (HYDRODIURIL) 25 MG tablet Take 1 tablet (25 mg total) by mouth daily. 90 tablet 1   Insulin Pen Needle (PEN NEEDLES) 32G X 4 MM MISC 1 each by Does not apply route daily. Use as directed daily with saxenda 100 each 3   lidocaine (LIDODERM) 5 % Place 1 patch onto the skin daily. Remove & Discard patch within 12 hours or as directed by MD 30 patch 0   Liraglutide -Weight Management (SAXENDA) 18 MG/3ML SOPN Inject 3 mg  into the skin daily. 15 mL 0   lisinopril-hydrochlorothiazide (ZESTORETIC) 20-25 MG tablet Take 1 tablet by mouth daily. 90 tablet 1   methocarbamol (ROBAXIN) 500 MG tablet Take 1 tablet (500 mg total) by mouth 2 (two) times daily. 20 tablet 0   methocarbamol (ROBAXIN) 750 MG tablet Take 750 mg by mouth 2 (two) times daily as needed for muscle spasms.     Multiple Vitamins-Minerals (MULTIVITAMIN PO) Take 1 tablet by mouth daily.     MYRBETRIQ 50 MG TB24 tablet Take 50 mg by mouth daily.     omeprazole (PRILOSEC) 40 MG capsule Take 40 mg by mouth daily.     predniSONE (DELTASONE) 20 MG tablet Take 2 tablets (40 mg total) by mouth daily with breakfast. (Patient not taking:  Reported on 03/05/2023) 10 tablet 0   predniSONE (DELTASONE) 20 MG tablet Take 1 tablet (20 mg total) by mouth daily with breakfast. 7 tablet 0   solifenacin (VESICARE) 10 MG tablet Take 1 tablet by mouth once daily 30 tablet 0   traMADol (ULTRAM) 50 MG tablet Take 1 tablet (50 mg total) by mouth every 6 (six) hours as needed. 30 tablet    Vitamin D, Ergocalciferol, (DRISDOL) 1.25 MG (50000 UNIT) CAPS capsule Take 1 capsule (50,000 Units total) by mouth every 7 (seven) days. 12 capsule 0   No facility-administered medications prior to visit.      Review of Systems  Review of Systems   Physical Exam  There were no vitals taken for this visit. Physical Exam   Lab Results:  CBC    Component Value Date/Time   WBC 6.6 08/03/2022 1632   RBC 3.53 (L) 08/03/2022 1632   HGB 9.9 (L) 08/03/2022 1640   HGB 10.8 (L) 11/29/2020 1654   HCT 29.0 (L) 08/03/2022 1640   HCT 34.0 11/29/2020 1654   PLT 220 08/03/2022 1632   PLT 246 11/29/2020 1654   MCV 85.3 08/03/2022 1632   MCV 83 11/29/2020 1654   MCH 26.3 08/03/2022 1632   MCHC 30.9 08/03/2022 1632   RDW 13.9 08/03/2022 1632   RDW 12.6 11/29/2020 1654   LYMPHSABS 1.8 08/03/2022 1632   LYMPHSABS 1.8 11/02/2018 1333   MONOABS 0.7 08/03/2022 1632   EOSABS 0.2 08/03/2022 1632   EOSABS 0.2 11/02/2018 1333   BASOSABS 0.0 08/03/2022 1632   BASOSABS 0.0 11/02/2018 1333    BMET    Component Value Date/Time   NA 139 08/03/2022 1640   NA 139 12/21/2021 1304   K 4.0 08/03/2022 1640   CL 102 08/03/2022 1632   CO2 27 08/03/2022 1632   GLUCOSE 107 (H) 08/03/2022 1632   BUN 19 08/03/2022 1632   BUN 24 12/21/2021 1304   CREATININE 1.16 (H) 08/03/2022 1632   CALCIUM 8.6 (L) 08/03/2022 1632   GFRNONAA 52 (L) 08/03/2022 1632   GFRAA 76 04/27/2020 1653    BNP    Component Value Date/Time   BNP 50.8 01/21/2020 1542    ProBNP    Component Value Date/Time   PROBNP <30.0 09/16/2009 0430    Imaging: No results  found.   Assessment & Plan:   No problem-specific Assessment & Plan notes found for this encounter.     Glenford Bayley, NP 05/27/2023

## 2023-07-17 ENCOUNTER — Telehealth: Payer: Self-pay | Admitting: Pulmonary Disease

## 2023-07-17 DIAGNOSIS — G4733 Obstructive sleep apnea (adult) (pediatric): Secondary | ICD-10-CM

## 2023-07-17 NOTE — Telephone Encounter (Signed)
 Answering Service:  Patient is having a hard time breathing, oxygen levels are low and feeling drowsy.

## 2023-07-17 NOTE — Telephone Encounter (Signed)
 Pt states she is having trouble with her CPAP due to leakage. Pt states she is needing a new CPAP machine. Pt has only used it for about 90 days. Pt needs a new mask due to it not fitting. Pt states her CPAP machine will not cut on and her insurance company told her she needed a new Rx for a new one. Pt states she ihad had SOB for 2 weeks and is using inhaler. Only SOB when exertion. Pt states she felt drowsy after she eats, but states she has been like this for a while. Pt gets very sleepy after eating and drinks 5 hour energy's to help stay awake. Is using albuterol  as needed. Please advise. Not Urgent

## 2023-07-21 NOTE — Telephone Encounter (Signed)
 DME referral for new mask  CPAP still the best treatment for severe obstructive sleep apnea  Not a candidate for hypoglossal nerve because of the weight, may be a candidate if she is able to lose a lot of weight.   If struggling significantly, needs to be physically seen in the office

## 2023-07-23 NOTE — Telephone Encounter (Signed)
 Pt needs a new order for a new machine

## 2023-07-23 NOTE — Telephone Encounter (Signed)
 I placed order for new CPAP mask  I have called the pt and there was no answer- no voicemail was set up yet  Will try back later

## 2023-07-24 NOTE — Telephone Encounter (Signed)
 Per Nida Boatman with [employer]:    We need face to face notes showing usage and benefit for this patient. I do not see any ov notes since her setup.  Notes in her account show she is non-compliant due to needed OV face 2 face notes for her follow up after setup.

## 2023-07-24 NOTE — Telephone Encounter (Signed)
 I called and spoke with the pt  She states that [employer] told her that she "did not meet requirement" and needs to have a new CPAP ordered  I think she may have meant sleep study?   PCC's will you please find out what DME is needing, thanks!

## 2023-07-26 NOTE — Telephone Encounter (Signed)
 Pt has an appt scheduled 1/14 with Beth.

## 2023-07-27 ENCOUNTER — Telehealth: Payer: Self-pay | Admitting: Emergency Medicine

## 2023-07-27 NOTE — Telephone Encounter (Signed)
 Patient called regarding cough and requested prescription cough syrup  She developed URI sx beginning 1/9. She started pred and azithro that she already had at home. Still with cough. I've recommended that she try Delsym OTC to see if she gets relief. She has an OV in our office next week and can discuss how she is doing at that time. Did ask her to wear a mask as long as she has symptoms.

## 2023-07-29 ENCOUNTER — Ambulatory Visit: Payer: Medicare PPO | Admitting: Primary Care

## 2023-07-29 NOTE — Telephone Encounter (Signed)
 I tried calling pt. Unable to reach, lvm asking for call back. Nothing can be sent in without being seen first. Pt is seeing Dr Wynona Neat tomorrow, and this can be discussed then. NFN.

## 2023-07-29 NOTE — Telephone Encounter (Signed)
 Patient was running late and had to reschedule her appt to tomorrow, 1/15 with Dr. Neda. Patient was adamant about getting prednisone , zpak and something stronger than delsym for her cough sent to [facility] on [address] in [place] today. Patient was a sickie called into the on call provider this weekend. Please advise

## 2023-07-30 ENCOUNTER — Encounter: Payer: Self-pay | Admitting: Pulmonary Disease

## 2023-07-30 ENCOUNTER — Ambulatory Visit: Payer: Medicare PPO | Admitting: Pulmonary Disease

## 2023-07-30 ENCOUNTER — Ambulatory Visit: Payer: Medicare PPO

## 2023-07-30 VITALS — BP 158/98 | HR 73 | Temp 98.3°F | Ht 66.5 in | Wt 359.8 lb

## 2023-07-30 DIAGNOSIS — J410 Simple chronic bronchitis: Secondary | ICD-10-CM | POA: Diagnosis not present

## 2023-07-30 DIAGNOSIS — G4733 Obstructive sleep apnea (adult) (pediatric): Secondary | ICD-10-CM | POA: Diagnosis not present

## 2023-07-30 MED ORDER — PREDNISONE 20 MG PO TABS: 20.0000 mg | ORAL_TABLET | Freq: Every day | ORAL | 0 refills | Status: DC

## 2023-07-30 MED ORDER — DOXYCYCLINE HYCLATE 100 MG PO TABS: 100.0000 mg | ORAL_TABLET | Freq: Two times a day (BID) | ORAL | 0 refills | Status: DC

## 2023-07-30 NOTE — Patient Instructions (Signed)
 Chest x-ray today  Will send in a prescription for doxycycline  and prednisone   Will send in a new CPAP order to your medical supply company  Follow-up in 3 months  Use Mucinex  for the cough-Robitussin is the active agents  You can use Delsym at night to help cough suppression so I can get some rest  We will let you know what the chest x-ray shows  Call us  with significant concerns

## 2023-07-30 NOTE — Progress Notes (Signed)
 Joan [name]    [id number]    [birth year]  Primary Care Physician:Pcp, No  Referring Physician: No referring provider defined for this encounter.  Chief complaint:   Patient being seen for obstructive sleep apnea Cough congestion  HPI:  Has had cough and congestion for the last few weeks She finished up a course of azithromycin  that she had in hand but symptoms are not fully resolved  Still requiring cough medication to help her sleep at night  She does have severe obstructive sleep apnea Trying to get used to using CPAP but says machine is not working well Discussed about placing an order for a new machine  We have talked about inspire device during the last visit  She is limited with activities of daily living She does have significant pain and discomfort in back and knee  Some fluid retention  having increased symptoms of daytime sleepiness and fatigue Continuing weight gain  She tolerated CPAP well without significant difficulty when she got used to using it previously  Outpatient Encounter Medications as of 07/30/2023  Medication Sig   albuterol  (VENTOLIN  HFA) 108 (90 Base) MCG/ACT inhaler Inhale 1-2 puffs into the lungs every 6 (six) hours as needed for wheezing or shortness of breath.   apixaban  (ELIQUIS ) 5 MG TABS tablet Take 1 tablet (5 mg total) by mouth 2 (two) times daily.   ASCORBIC ACID PO Take 1 tablet by mouth daily.   azithromycin  (ZITHROMAX  Z-PAK) 250 MG tablet Take 2 tablets day 1 and then 1 daily for 4 days   azithromycin  (ZITHROMAX  Z-PAK) 250 MG tablet Take 2 tablets day 1 and then 1 daily for 4 days   benzonatate  (TESSALON ) 200 MG capsule Take 1 capsule (200 mg total) by mouth 3 (three) times daily as needed for cough.   cholecalciferol (VITAMIN D3) 25 MCG (1000 UT) tablet Take 1,000 Units by mouth daily.   CYCLOBENZAPRINE HCL PO Take 1 tablet by mouth at bedtime as needed.   diclofenac  sodium (VOLTAREN ) 1 % GEL Apply 2 g topically 4  (four) times daily.   ezetimibe -simvastatin  (VYTORIN ) 10-40 MG tablet Take 1 tablet by mouth daily.   Fe Fum-FePoly-FA-Vit C-Vit B3 (INTEGRA F ) 125-1 MG CAPS Take 1 tablet by mouth daily.   furosemide (LASIX) 20 MG tablet Take 20 mg by mouth daily.   GEMTESA 75 MG TABS Take 1 tablet by mouth daily.   guaiFENesin -dextromethorphan (ROBITUSSIN DM) 100-10 MG/5ML syrup Take 5 mLs by mouth every 6 (six) hours as needed for cough.   hydrALAZINE (APRESOLINE) 25 MG tablet Take by mouth.   hydrochlorothiazide  (HYDRODIURIL ) 25 MG tablet Take 1 tablet (25 mg total) by mouth daily.   Insulin Pen Needle (PEN NEEDLES) 32G X 4 MM MISC 1 each by Does not apply route daily. Use as directed daily with saxenda    lidocaine  (LIDODERM ) 5 % Place 1 patch onto the skin daily. Remove & Discard patch within 12 hours or as directed by MD   Liraglutide  -Weight Management (SAXENDA ) 18 MG/3ML SOPN Inject 3 mg into the skin daily.   lisinopril -hydrochlorothiazide  (ZESTORETIC ) 20-25 MG tablet Take 1 tablet by mouth daily.   methocarbamol  (ROBAXIN ) 500 MG tablet Take 1 tablet (500 mg total) by mouth 2 (two) times daily.   methocarbamol  (ROBAXIN ) 750 MG tablet Take 750 mg by mouth 2 (two) times daily as needed for muscle spasms.   Multiple Vitamins-Minerals (MULTIVITAMIN PO) Take 1 tablet by mouth daily.   MYRBETRIQ 50  MG TB24 tablet Take 50 mg by mouth daily.   nitrofurantoin , macrocrystal-monohydrate, (MACROBID ) 100 MG capsule Take 100 mg by mouth 2 (two) times daily.   omeprazole (PRILOSEC) 40 MG capsule Take 40 mg by mouth daily.   Semaglutide,0.25 or 0.5MG /DOS, 2 MG/3ML SOPN Inject into the skin.   solifenacin  (VESICARE ) 10 MG tablet Take 1 tablet by mouth once daily   traMADol  (ULTRAM ) 50 MG tablet Take 1 tablet (50 mg total) by mouth every 6 (six) hours as needed.   Vitamin D , Ergocalciferol , (DRISDOL ) 1.25 MG (50000 UNIT) CAPS capsule Take 1 capsule (50,000 Units total) by mouth every 7 (seven) days.   predniSONE   (DELTASONE ) 20 MG tablet Take 1 tablet (20 mg total) by mouth daily with breakfast. (Patient not taking: Reported on 07/30/2023)   [DISCONTINUED] predniSONE  (DELTASONE ) 20 MG tablet Take 2 tablets (40 mg total) by mouth daily with breakfast. (Patient not taking: Reported on 07/30/2023)   No facility-administered encounter medications on file as of 07/30/2023.    Allergies as of 07/30/2023 - Review Complete 07/30/2023  Allergen Reaction Noted   Penicillins Other (See Comments) 09/18/2022    Past Medical History:  Diagnosis Date   Acid reflux    Diabetes mellitus without complication (HCC)    Pt states she is prediabetic   Hypertension    OSA (obstructive sleep apnea)     Past Surgical History:  Procedure Laterality Date   ABDOMINAL HYSTERECTOMY     TONSILECTOMY, ADENOIDECTOMY, BILATERAL MYRINGOTOMY AND TUBES      Family History  Problem Relation Age of Onset   Heart disease Mother    Heart disease Father    Prostate cancer Father     Social History   Socioeconomic History   Marital status: Single    Spouse name: Not on file   Number of children: Not on file   Years of education: Not on file   Highest education level: Not on file  Occupational History   Occupation: CNA  Tobacco Use   Smoking status: Never   Smokeless tobacco: Never  Substance and Sexual Activity   Alcohol use: No   Drug use: No   Sexual activity: Not on file  Other Topics Concern   Not on file  Social History Narrative   ** Merged History Encounter **       Social Drivers of Health   Financial Resource Strain: Low Risk  (10/02/2022)   Received from [facility], [facility]   Overall Financial Resource Strain (CARDIA)    Difficulty of Paying Living Expenses: Not hard at all  Food Insecurity: No Food Insecurity (11/27/2022)   Received from [facility], [facility]   Hunger Vital Sign    Worried About Running Out of Food in the Last Year: Never true    Ran Out of Food in the  Last Year: Never true  Transportation Needs: No Transportation Needs (11/27/2022)   Received from [facility], [facility]   PRAPARE - Transportation    Lack of Transportation (Medical): No    Lack of Transportation (Non-Medical): No  Physical Activity: Not on file  Stress: Not on file  Social Connections: Not on file  Intimate Partner Violence: Not At Risk (11/27/2022)   Received from [facility], [facility]   Humiliation, Afraid, Rape, and Kick questionnaire    Fear of Current or Ex-Partner: No    Emotionally Abused: No    Physically Abused: No  Sexually Abused: No    Review of Systems  Constitutional:  Positive for fatigue.  Respiratory:  Positive for apnea, cough and shortness of breath.   Psychiatric/Behavioral:  Positive for sleep disturbance.     Vitals:   07/30/23 1141  BP: (!) 158/98  Pulse: 73  Temp: 98.3 F (36.8 C)  SpO2: 99%     Physical Exam Constitutional:      Appearance: She is obese.  HENT:     Head: Normocephalic.     Mouth/Throat:     Mouth: Mucous membranes are moist.  Eyes:     General: No scleral icterus.    Pupils: Pupils are equal, round, and reactive to light.  Cardiovascular:     Rate and Rhythm: Normal rate and regular rhythm.     Heart sounds: No murmur heard.    No friction rub.  Pulmonary:     Effort: No respiratory distress.     Breath sounds: No stridor. No wheezing or rhonchi.  Musculoskeletal:        General: Swelling present.     Cervical back: No rigidity or tenderness.  Neurological:     Mental Status: She is alert.  Psychiatric:        Mood and Affect: Mood normal.   Data Reviewed: Previous records reviewed  Recent sleep study was discussed with the patient showing severe obstructive sleep apnea with severe oxygen desaturations  Most recent sleep study reviewed Titration study reviewed  Assessment:  Severe obstructive sleep apnea -Not using CPAP currently -Needs a new machine  Cough and  congestion -Will call in a new course of antibiotics and steroids  Will get a chest x-ray today -Chest x-ray reviewed today Does not show any evidence of acute infection  Class III obesity  Deconditioning likely playing a role  Plan/Recommendations: DME have referral for a new CPAP -she is on a CPAP of 13  Prescription called in for doxycycline  and prednisone   Chest x-ray with no active infection  Likely bronchitis  Encourage weight loss efforts  Follow-up in about 3 months  Encouraged to use expectorants 12 management of a cough    Myer Artis MD [facility] 07/30/2023, 11:58 AM  CC: No ref. provider found

## 2023-08-19 ENCOUNTER — Telehealth: Payer: Self-pay | Admitting: Pulmonary Disease

## 2023-08-19 MED ORDER — PREDNISONE 20 MG PO TABS: ORAL_TABLET | ORAL | 0 refills | Status: AC

## 2023-08-19 NOTE — Telephone Encounter (Signed)
 Lm x1 for patient.

## 2023-08-19 NOTE — Telephone Encounter (Signed)
 CXR results already relayed via Dr. Neda 07/30/23 via MyChart - result from radiology read copied below  IMPRESSION: No active cardiopulmonary disease.   I would advise she be seen in ED or urgent care for chest imaging and exam especially since had recent course of antibiotics and steroids and symptoms have come back.  Given what sounds like viral exposure, will call in prednisone  taper.

## 2023-08-19 NOTE — Telephone Encounter (Signed)
 Patient is currently sick and is cough up yellow mucus. Se has no fever.She has been taking over the counter medications and has been using her inhaler. She would like for something to be called in. She can be reached at  [phone]  Pharmacy: [facility] [address] in [place]    Patient would also like to know the results of her chest x-ray

## 2023-08-19 NOTE — Telephone Encounter (Signed)
 Called and spoke to patient.  She reports of prod cough with yellow sputum, body aches, headache and wheezing. Sx Improved after recent course of abx and prednisone  but have worsened over the past three days. SOB is baseline. Denies f/c/s or additional sx. She completed course of prednisone  and doxy that was prescribed on 1/15.  She has tried OTC teas and delsym without relief.  she is using albuterol  HFA BID. Her son recently tested post for covid. She is going to test and call back with results.  She is also requesting CXR results from 07/30/2023.  Dr. Annella, please advise. AO is unavailable.

## 2023-08-19 NOTE — Telephone Encounter (Signed)
 Patient is aware of recommendations and voiced her understanding. Nothing further needed.

## 2023-08-19 NOTE — Telephone Encounter (Signed)
Patient is returning phone call. Patient phone number is [phone].

## 2023-08-28 ENCOUNTER — Other Ambulatory Visit: Payer: Self-pay | Admitting: Hematology and Oncology

## 2023-08-28 ENCOUNTER — Other Ambulatory Visit: Payer: Self-pay | Admitting: Nurse Practitioner

## 2023-08-29 MED ORDER — VITAMIN D (ERGOCALCIFEROL) 1.25 MG (50000 UNIT) PO CAPS: 50000.0000 [IU] | ORAL_CAPSULE | ORAL | 0 refills | Status: AC

## 2023-09-03 ENCOUNTER — Other Ambulatory Visit: Payer: Self-pay

## 2023-09-03 DIAGNOSIS — J189 Pneumonia, unspecified organism: Secondary | ICD-10-CM

## 2023-09-03 NOTE — Telephone Encounter (Signed)
LOV 07/30/23 Last Rx 08/05/22 0 rf  Please advise on refill request, thank you!

## 2023-09-04 MED ORDER — GUAIFENESIN-DM 100-10 MG/5ML PO SYRP: 5.0000 mL | ORAL_SOLUTION | Freq: Four times a day (QID) | ORAL | 0 refills | Status: DC | PRN

## 2023-11-04 ENCOUNTER — Ambulatory Visit: Payer: Medicare PPO | Admitting: Pulmonary Disease

## 2023-11-04 ENCOUNTER — Telehealth: Payer: Self-pay

## 2023-11-04 ENCOUNTER — Encounter: Payer: Self-pay | Admitting: Pulmonary Disease

## 2023-11-04 VITALS — BP 137/79 | HR 90 | Ht 66.5 in | Wt 345.0 lb

## 2023-11-04 DIAGNOSIS — R0602 Shortness of breath: Secondary | ICD-10-CM

## 2023-11-04 DIAGNOSIS — R5381 Other malaise: Secondary | ICD-10-CM | POA: Diagnosis not present

## 2023-11-04 DIAGNOSIS — G4733 Obstructive sleep apnea (adult) (pediatric): Secondary | ICD-10-CM | POA: Diagnosis not present

## 2023-11-04 NOTE — Patient Instructions (Signed)
 I will see you back in about 6 months  We will contact [employer] to make sure they have an order for your new CPAP  CPAP of 13  I ordered a breathing study  I ordered thyroid functions  Make sure you keep your appointment with the cardiologist  Call us  with significant concerns

## 2023-11-04 NOTE — Progress Notes (Signed)
 Joan [name]    [id number]    [birth year]  Primary Care Physician:Pcp, No  Referring Physician: No referring provider defined for this encounter.  Chief complaint:   Patient being seen for obstructive sleep apnea  HPI:  Continue to some shortness of breath  Fatigue Cough  Has had some leg swelling  History of severe obstructive sleep apnea Compliant with CPAP use She is awaiting a new machine, currently on CPAP at 13  Has managed to lose some weight recently but still working on weight loss  Limited actives of daily living Does have significant back and knee pain  Has been having shortness of breath with wheezing whenever she tries to get active  Daytime sleepiness and fatigue  She tolerated CPAP well without significant difficulty when she got used to using it previously  Outpatient Encounter Medications as of 11/04/2023  Medication Sig   albuterol  (VENTOLIN  HFA) 108 (90 Base) MCG/ACT inhaler Inhale 1-2 puffs into the lungs every 6 (six) hours as needed for wheezing or shortness of breath.   apixaban  (ELIQUIS ) 5 MG TABS tablet Take 1 tablet (5 mg total) by mouth 2 (two) times daily.   ASCORBIC ACID PO Take 1 tablet by mouth daily.   benzonatate  (TESSALON ) 200 MG capsule Take 1 capsule (200 mg total) by mouth 3 (three) times daily as needed for cough.   cholecalciferol (VITAMIN D3) 25 MCG (1000 UT) tablet Take 1,000 Units by mouth daily.   CYCLOBENZAPRINE HCL PO Take 1 tablet by mouth at bedtime as needed.   diclofenac  sodium (VOLTAREN ) 1 % GEL Apply 2 g topically 4 (four) times daily.   doxycycline  (VIBRA -TABS) 100 MG tablet Take 1 tablet (100 mg total) by mouth 2 (two) times daily.   ezetimibe -simvastatin  (VYTORIN ) 10-40 MG tablet Take 1 tablet by mouth daily.   Fe Fum-FePoly-FA-Vit C-Vit B3 (INTEGRA F ) 125-1 MG CAPS Take 1 tablet by mouth daily.   furosemide (LASIX) 20 MG tablet Take 20 mg by mouth daily.   GEMTESA 75 MG TABS Take 1 tablet by mouth  daily.   guaiFENesin -dextromethorphan (ROBITUSSIN DM) 100-10 MG/5ML syrup Take 5 mLs by mouth every 6 (six) hours as needed for cough.   hydrALAZINE (APRESOLINE) 25 MG tablet Take by mouth.   hydrochlorothiazide  (HYDRODIURIL ) 25 MG tablet Take 1 tablet (25 mg total) by mouth daily.   Insulin Pen Needle (PEN NEEDLES) 32G X 4 MM MISC 1 each by Does not apply route daily. Use as directed daily with saxenda    lidocaine  (LIDODERM ) 5 % Place 1 patch onto the skin daily. Remove & Discard patch within 12 hours or as directed by MD   lisinopril -hydrochlorothiazide  (ZESTORETIC ) 20-25 MG tablet Take 1 tablet by mouth daily.   methocarbamol  (ROBAXIN ) 500 MG tablet Take 1 tablet (500 mg total) by mouth 2 (two) times daily.   methocarbamol  (ROBAXIN ) 750 MG tablet Take 750 mg by mouth 2 (two) times daily as needed for muscle spasms.   Multiple Vitamins-Minerals (MULTIVITAMIN PO) Take 1 tablet by mouth daily.   MYRBETRIQ 50 MG TB24 tablet Take 50 mg by mouth daily.   omeprazole (PRILOSEC) 40 MG capsule Take 40 mg by mouth daily.   Semaglutide,0.25 or 0.5MG /DOS, 2 MG/3ML SOPN Inject into the skin.   solifenacin  (VESICARE ) 10 MG tablet Take 1 tablet by mouth once daily   traMADol  (ULTRAM ) 50 MG tablet Take 1 tablet (50 mg total) by mouth every 6 (six) hours as needed.   Vitamin  D, Ergocalciferol , (DRISDOL ) 1.25 MG (50000 UNIT) CAPS capsule Take 1 capsule (50,000 Units total) by mouth every 7 (seven) days.   azithromycin  (ZITHROMAX  Z-PAK) 250 MG tablet Take 2 tablets day 1 and then 1 daily for 4 days (Patient not taking: Reported on 11/04/2023)   azithromycin  (ZITHROMAX  Z-PAK) 250 MG tablet Take 2 tablets day 1 and then 1 daily for 4 days (Patient not taking: Reported on 11/04/2023)   Liraglutide  -Weight Management (SAXENDA ) 18 MG/3ML SOPN Inject 3 mg into the skin daily. (Patient not taking: Reported on 11/04/2023)   nitrofurantoin , macrocrystal-monohydrate, (MACROBID ) 100 MG capsule Take 100 mg by mouth 2 (two) times  daily. (Patient not taking: Reported on 11/04/2023)   No facility-administered encounter medications on file as of 11/04/2023.    Allergies as of 11/04/2023 - Review Complete 11/04/2023  Allergen Reaction Noted   Penicillins Other (See Comments) 09/18/2022    Past Medical History:  Diagnosis Date   Acid reflux    Diabetes mellitus without complication (HCC)    Pt states she is prediabetic   Hypertension    OSA (obstructive sleep apnea)     Past Surgical History:  Procedure Laterality Date   ABDOMINAL HYSTERECTOMY     TONSILECTOMY, ADENOIDECTOMY, BILATERAL MYRINGOTOMY AND TUBES      Family History  Problem Relation Age of Onset   Heart disease Mother    Heart disease Father    Prostate cancer Father     Social History   Socioeconomic History   Marital status: Single    Spouse name: Not on file   Number of children: Not on file   Years of education: Not on file   Highest education level: Not on file  Occupational History   Occupation: CNA  Tobacco Use   Smoking status: Never   Smokeless tobacco: Never  Substance and Sexual Activity   Alcohol use: No   Drug use: No   Sexual activity: Not on file  Other Topics Concern   Not on file  Social History Narrative   ** Merged History Encounter **       Social Drivers of Health   Financial Resource Strain: Low Risk  (10/02/2022)   Received from [facility], [facility]   Overall Financial Resource Strain (CARDIA)    Difficulty of Paying Living Expenses: Not hard at all  Food Insecurity: No Food Insecurity (11/27/2022)   Received from [facility], [facility]   Hunger Vital Sign    Worried About Running Out of Food in the Last Year: Never true    Ran Out of Food in the Last Year: Never true  Transportation Needs: No Transportation Needs (11/27/2022)   Received from [facility], [facility]   PRAPARE - Transportation    Lack of Transportation (Medical): No    Lack of Transportation  (Non-Medical): No  Physical Activity: Not on file  Stress: Not on file  Social Connections: Not on file  Intimate Partner Violence: Not At Risk (11/27/2022)   Received from [facility], [facility]   Humiliation, Afraid, Rape, and Kick questionnaire    Fear of Current or Ex-Partner: No    Emotionally Abused: No    Physically Abused: No    Sexually Abused: No    Review of Systems  Constitutional:  Positive for fatigue.  Respiratory:  Positive for apnea, cough and shortness of breath.   Psychiatric/Behavioral:  Positive for sleep disturbance.     Vitals:  11/04/23 1520  BP: 137/79  Pulse: 90  SpO2: 93%     Physical Exam Constitutional:      Appearance: She is obese.  HENT:     Head: Normocephalic.     Mouth/Throat:     Mouth: Mucous membranes are moist.  Eyes:     General: No scleral icterus.    Pupils: Pupils are equal, round, and reactive to light.  Cardiovascular:     Rate and Rhythm: Normal rate and regular rhythm.     Heart sounds: No murmur heard.    No friction rub.  Pulmonary:     Effort: No respiratory distress.     Breath sounds: No stridor. No wheezing or rhonchi.  Musculoskeletal:        General: Swelling present.     Cervical back: No rigidity or tenderness.  Neurological:     Mental Status: She is alert.  Psychiatric:        Mood and Affect: Mood normal.    Data Reviewed: Previous records reviewed  Recent sleep study was discussed with the patient showing severe obstructive sleep apnea with severe oxygen desaturations  Most recent sleep study reviewed Titration study reviewed  Compliance data reviewed showing 80% compliance Average use of 4 hours 12 minutes CPAP set at 13 Residual AHI of 2.3 She does have some mask leaks  Assessment:  Severe obstructive sleep apnea - Awaiting new CPAP machine  Class III obesity - Was wondering whether she could have thyroid dysfunction - Agreed to obtain thyroid function test as she has not  had one done recently  Chronic fatigue and pain - Continue to follow  Deconditioning  Wheezing  Plan/Recommendations: Ensure she has DME referral for CPAP, CPAP of 13  Continue weight loss efforts  Schedule for thyroid function test  Schedule for pulmonary function test  Graded activities as tolerated  Follow-up in about 6 months   Myer Artis MD [facility] 11/04/2023, 8:22 PM  CC: No ref. provider found

## 2023-11-04 NOTE — Telephone Encounter (Signed)
 Called pt to let her know I talked to [employer] and they are looking into the missing Cpap machine ( records state she received it) but she only received the supplies

## 2023-11-04 NOTE — Telephone Encounter (Signed)
 Called [employer] about Cpap anfd try to find out why she never recivied it

## 2023-11-10 ENCOUNTER — Encounter: Payer: Self-pay | Admitting: Cardiology

## 2023-11-10 DIAGNOSIS — E119 Type 2 diabetes mellitus without complications: Secondary | ICD-10-CM | POA: Insufficient documentation

## 2023-11-10 DIAGNOSIS — K219 Gastro-esophageal reflux disease without esophagitis: Secondary | ICD-10-CM | POA: Insufficient documentation

## 2023-11-10 DIAGNOSIS — D249 Benign neoplasm of unspecified breast: Secondary | ICD-10-CM | POA: Insufficient documentation

## 2023-11-10 DIAGNOSIS — R32 Unspecified urinary incontinence: Secondary | ICD-10-CM | POA: Insufficient documentation

## 2023-11-10 HISTORY — DX: Benign neoplasm of unspecified breast: D24.9

## 2023-11-10 HISTORY — DX: Unspecified urinary incontinence: R32

## 2023-11-12 ENCOUNTER — Encounter: Payer: Self-pay | Admitting: Cardiology

## 2023-11-12 ENCOUNTER — Ambulatory Visit: Attending: Cardiology | Admitting: Cardiology

## 2023-11-12 VITALS — BP 144/86 | HR 74 | Ht 66.0 in | Wt 386.8 lb

## 2023-11-12 DIAGNOSIS — I10 Essential (primary) hypertension: Secondary | ICD-10-CM

## 2023-11-12 DIAGNOSIS — E119 Type 2 diabetes mellitus without complications: Secondary | ICD-10-CM | POA: Diagnosis not present

## 2023-11-12 DIAGNOSIS — G4730 Sleep apnea, unspecified: Secondary | ICD-10-CM | POA: Diagnosis not present

## 2023-11-12 DIAGNOSIS — I350 Nonrheumatic aortic (valve) stenosis: Secondary | ICD-10-CM

## 2023-11-12 DIAGNOSIS — Z86711 Personal history of pulmonary embolism: Secondary | ICD-10-CM

## 2023-11-12 DIAGNOSIS — E782 Mixed hyperlipidemia: Secondary | ICD-10-CM

## 2023-11-12 HISTORY — DX: Morbid (severe) obesity due to excess calories: E66.01

## 2023-11-12 HISTORY — DX: Nonrheumatic aortic (valve) stenosis: I35.0

## 2023-11-12 HISTORY — DX: Personal history of pulmonary embolism: Z86.711

## 2023-11-12 NOTE — Patient Instructions (Addendum)
 Medication Instructions:  Your physician recommends that you continue on your current medications as directed. Please refer to the Current Medication list given to you today.  *If you need a refill on your cardiac medications before your next appointment, please call your pharmacy*   Lab Work: None Ordered If you have labs (blood work) drawn today and your tests are completely normal, you will receive your results only by: MyChart Message (if you have MyChart) OR A paper copy in the mail If you have any lab test that is abnormal or we need to change your treatment, we will call you to review the results.   Testing/Procedures: Echocardiogram An echocardiogram is a test that uses sound waves (ultrasound) to produce images of the heart. Images from an echocardiogram can provide important information about: Heart size and shape. The size and thickness and movement of your heart's walls. Heart muscle function and strength. Heart valve function or if you have stenosis. Stenosis is when the heart valves are too narrow. If blood is flowing backward through the heart valves (regurgitation). A tumor or infectious growth around the heart valves. Areas of heart muscle that are not working well because of poor blood flow or injury from a heart attack. Aneurysm detection. An aneurysm is a weak or damaged part of an artery wall. The wall bulges out from the normal force of blood pumping through the body. Tell a health care provider about: Any allergies you have. All medicines you are taking, including vitamins, herbs, eye drops, creams, and over-the-counter medicines. Any blood disorders you have. Any surgeries you have had. Any medical conditions you have. Whether you are pregnant or may be pregnant. What are the risks? Generally, this is a safe test. However, problems may occur, including an allergic reaction to dye (contrast) that may be used during the test. What happens before the test? No  specific preparation is needed. You may eat and drink normally. What happens during the test?  You will take off your clothes from the waist up and put on a hospital gown. Electrodes or electrocardiogram (ECG)patches may be placed on your chest. The electrodes or patches are then connected to a device that monitors your heart rate and rhythm. You will lie down on a table for an ultrasound exam. A gel will be applied to your chest to help sound waves pass through your skin. A handheld device, called a transducer, will be pressed against your chest and moved over your heart. The transducer produces sound waves that travel to your heart and bounce back (or "echo" back) to the transducer. These sound waves will be captured in real-time and changed into images of your heart that can be viewed on a video monitor. The images will be recorded on a computer and reviewed by your health care provider. You may be asked to change positions or hold your breath for a short time. This makes it easier to get different views or better views of your heart. In some cases, you may receive contrast through an IV in one of your veins. This can improve the quality of the pictures from your heart. The procedure may vary among health care providers and hospitals. What can I expect after the test? You may return to your normal, everyday life, including diet, activities, and medicines, unless your health care provider tells you not to do that. Follow these instructions at home: It is up to you to get the results of your test. Ask your health care provider,  or the department that is doing the test, when your results will be ready. Keep all follow-up visits. This is important. Summary An echocardiogram is a test that uses sound waves (ultrasound) to produce images of the heart. Images from an echocardiogram can provide important information about the size and shape of your heart, heart muscle function, heart valve function, and  other possible heart problems. You do not need to do anything to prepare before this test. You may eat and drink normally. After the echocardiogram is completed, you may return to your normal, everyday life, unless your health care provider tells you not to do that. This information is not intended to replace advice given to you by your health care provider. Make sure you discuss any questions you have with your health care provider. Document Revised: 03/14/2021 Document Reviewed: 02/22/2020 Elsevier Patient Education  2023 Elsevier Inc.       Follow-Up: At [facility], you and your health needs are our priority.  As part of our continuing mission to provide you with exceptional heart care, we have created designated Provider Care Teams.  These Care Teams include your primary Cardiologist (physician) and Advanced Practice Providers (APPs -  Physician Assistants and Nurse Practitioners) who all work together to provide you with the care you need, when you need it.  We recommend signing up for the patient portal called "MyChart".  Sign up information is provided on this After Visit Summary.  MyChart is used to connect with patients for Virtual Visits (Telemedicine).  Patients are able to view lab/test results, encounter notes, upcoming appointments, etc.  Non-urgent messages can be sent to your provider as well.   To learn more about what you can do with MyChart, go to [email or web address].    Your next appointment:   12 month follow u

## 2023-11-12 NOTE — Progress Notes (Signed)
 Cardiology Office Note:    Date:  11/12/2023   ID:  Joan [name], DOB [birth year], MRN [id number]  PCP:  Pcp, No  Cardiologist:  Nelia Balzarine, MD   Referring MD: No ref. provider found    ASSESSMENT:    1. Diabetes mellitus without complication (HCC)   2. Essential hypertension   3. Mild aortic stenosis   4. Sleep apnea, unspecified type   5. Diabetes mellitus type 2 without retinopathy (HCC)   6. Mixed hyperlipidemia   7. Morbid obesity (HCC)   8. History of pulmonary embolism    PLAN:    In order of problems listed above:  Primary prevention stressed with the patient.  Importance of compliance with diet medication stressed and patient verbalized standing. Essential hypertension: Blood pressure stable and diet was emphasized.  Lifestyle modification urged. Mild aortic stenosis: Murmur present and we will reevaluate with echocardiogram. Dyslipidemia: Diet was emphasized.  This is followed by primary care. Obesity: Weight reduction stressed diet emphasized and she promises to do better.  Risks of obesity explained. Sleep apnea: Sleep health issues were discussed. Patient will be seen in follow-up appointment in 12 months or earlier if the patient has any concerns.    Medication Adjustments/Labs and Tests Ordered: Current medicines are reviewed at length with the patient today.  Concerns regarding medicines are outlined above.  Orders Placed This Encounter  Procedures   EKG 12-Lead   No orders of the defined types were placed in this encounter.    No chief complaint on file.    History of Present Illness:    Joan [name] is a 68 y.o. female.  Patient has past medical history of mild aortic stenosis, essential hypertension, mixed dyslipidemia sleep apnea and morbid obesity.  Essentially she leads a sedentary lifestyle because of morbid obesity.  Her BMI is greater than 62.  She denies any chest pain orthopnea or PND.  Overall she is stable at low level.   At the time of my evaluation, the patient is alert awake oriented and in no distress.  Past Medical History:  Diagnosis Date   Abnormal glucose 05/26/2019   Acid reflux    Acute bronchitis 08/05/2022   Acute pulmonary embolism (HCC) 01/21/2020   Age-related nuclear cataract of both eyes 03/07/2017   Anemia 08/05/2022   Antalgic gait 04/04/2023   Breast lump on left side at 4 o'clock position 06/28/2018   CAP (community acquired pneumonia) 08/05/2022   Chronic pain of both knees 11/02/2018   Class 3 severe obesity without serious comorbidity with body mass index (BMI) of 50.0 to 59.9 in adult (HCC) 01/26/2020   Complete tear of right rotator cuff 11/12/2016   Cough 01/26/2020   Diabetes mellitus type 2 without retinopathy (HCC) 03/07/2017   Diabetes mellitus without complication (HCC)    Pt states she is prediabetic   DVT of deep femoral vein, left (HCC) 01/26/2020   Essential hypertension 06/28/2018   Fall from slipping on ice 07/26/2019   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   Functional urinary incontinence 06/28/2018   Hypertension    Mixed hyperlipidemia 05/26/2019   Myopia with astigmatism and presbyopia, bilateral 03/07/2017   OSA (obstructive sleep apnea)    Primary localized osteoarthrosis of the knee, right 11/12/2016   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   apixaban  (ELIQUIS ) 5 MG TABS tablet Take 1 tablet (5 mg total) by mouth 2 (two) times daily.    ASCORBIC ACID PO Take 1 tablet by mouth daily.   CYCLOBENZAPRINE HCL PO Take 1 tablet by mouth at bedtime as needed.   doxycycline  (VIBRA -TABS) 100 MG tablet Take 1 tablet (100 mg total) by mouth 2 (two) times daily.   ezetimibe -simvastatin  (VYTORIN ) 10-40 MG tablet Take 1 tablet by mouth daily.   Fe Fum-FePoly-FA-Vit C-Vit B3 (INTEGRA F ) 125-1 MG CAPS Take 1 tablet by mouth daily.   furosemide (LASIX) 20 MG tablet Take 20 mg by mouth daily.   GEMTESA 75 MG TABS Take 1 tablet by mouth daily.   guaiFENesin -dextromethorphan (ROBITUSSIN DM) 100-10 MG/5ML syrup Take 5 mLs by mouth every 6 (six) hours as needed for cough.   hydrALAZINE (APRESOLINE) 25 MG tablet Take by mouth.   Insulin Pen Needle (PEN NEEDLES) 32G X 4 MM MISC 1 each by Does not apply route daily. Use as directed daily with saxenda    lidocaine  (LIDODERM ) 5 % Place 1 patch onto the skin daily. Remove & Discard patch within 12 hours or as directed by MD   lisinopril -hydrochlorothiazide  (ZESTORETIC ) 20-25 MG tablet Take 1 tablet by mouth daily.   methocarbamol  (ROBAXIN ) 500 MG tablet Take 1 tablet (500 mg total) by mouth 2 (two) times daily.   methocarbamol  (ROBAXIN ) 750 MG tablet Take 750 mg by mouth 2 (two) times daily as needed for muscle spasms.   Multiple Vitamins-Minerals (MULTIVITAMIN PO) Take 1 tablet by mouth daily.   MYRBETRIQ 50 MG TB24 tablet Take 50 mg by mouth daily.   omeprazole (PRILOSEC) 40 MG capsule Take 40 mg by mouth daily.   solifenacin  (VESICARE ) 10 MG tablet Take 1 tablet by mouth once daily   traMADol  (ULTRAM ) 50 MG tablet Take 1 tablet (50 mg total) by mouth every 6 (six) hours as needed.   Vitamin D , Ergocalciferol , (DRISDOL ) 1.25 MG (50000 UNIT) CAPS capsule Take 1 capsule (50,000 Units total) by mouth every 7 (seven) days.     Allergies:   Penicillins   Social History   Socioeconomic History   Marital status: Single    Spouse name: Not on file   Number of children: Not on file   Years of education:  Not on file   Highest education level: Not on file  Occupational History   Occupation: CNA  Tobacco Use   Smoking status: Never   Smokeless tobacco: Never  Substance and Sexual Activity   Alcohol use: No   Drug use: No   Sexual activity: Not on file  Other Topics Concern   Not on file  Social History Narrative   ** Merged History Encounter **       Social Drivers of Health   Financial Resource Strain: Low Risk  (10/02/2022)   Received from [facility], [facility]   Overall Financial Resource Strain (CARDIA)    Difficulty of Paying Living Expenses: Not hard at all  Food Insecurity: No Food Insecurity (11/27/2022)   Received from [facility], [facility]   Hunger Vital Sign  Worried About Programme researcher, broadcasting/film/video in the Last Year: Never true    Ran Out of Food in the Last Year: Never true  Transportation Needs: No Transportation Needs (11/27/2022)   Received from [facility], [facility]   [facility] - Transportation    Lack of Transportation (Medical): No    Lack of Transportation (Non-Medical): No  Physical Activity: Not on file  Stress: Not on file  Social Connections: Not on file     Family History: The patient's family history includes Heart disease in her father and mother; Prostate cancer in her father.  ROS:   Please see the history of present illness.    All other systems reviewed and are negative.  EKGs/Labs/Other Studies Reviewed:    The following studies were reviewed today: .Aaron AasEKG Interpretation Date/Time:  Wednesday November 12 2023 15:22:46 EDT Ventricular Rate:  74 PR Interval:  192 QRS Duration:  82 QT Interval:  424 QTC Calculation: 470 R Axis:   58  Text Interpretation: Normal sinus rhythm Normal ECG When compared with ECG of 03-Aug-2022 16:50, PREVIOUS ECG IS PRESENT Confirmed by Hillis Lu [phone]) on 11/12/2023 3:29:28 PM     Recent Labs: No results found for requested labs within last 365 days.  Recent Lipid Panel     Component Value Date/Time   CHOL 177 06/29/2020 1631   TRIG 78 06/29/2020 1631   HDL 57 06/29/2020 1631   CHOLHDL 3.1 06/29/2020 1631   CHOLHDL 4.1 09/16/2009 0430   VLDL 10 09/16/2009 0430   LDLCALC 105 (H) 06/29/2020 1631    Physical Exam:    VS:  BP (!) 144/86   Pulse 74   Ht 5\' 6"  (1.676 m)   Wt (!) 386 lb 12.8 oz (175.5 kg)   SpO2 96%   BMI 62.43 kg/m     Wt Readings from Last 3 Encounters:  11/12/23 (!) 386 lb 12.8 oz (175.5 kg)  11/04/23 (!) 345 lb (156.5 kg)  07/30/23 (!) 359 lb 12.8 oz (163.2 kg)     GEN: Patient is in no acute distress HEENT: Normal NECK: No JVD; No carotid bruits LYMPHATICS: No lymphadenopathy CARDIAC: Hear sounds regular, 2/6 systolic murmur at the apex. RESPIRATORY:  Clear to auscultation without rales, wheezing or rhonchi  ABDOMEN: Soft, non-tender, non-distended MUSCULOSKELETAL:  No edema; No deformity  SKIN: Warm and dry NEUROLOGIC:  Alert and oriented x 3 PSYCHIATRIC:  Normal affect   Signed, Nelia Balzarine, MD  11/12/2023 3:38 PM    [facility]

## 2023-12-05 ENCOUNTER — Emergency Department (HOSPITAL_BASED_OUTPATIENT_CLINIC_OR_DEPARTMENT_OTHER)

## 2023-12-05 ENCOUNTER — Other Ambulatory Visit: Payer: Self-pay

## 2023-12-05 ENCOUNTER — Emergency Department (HOSPITAL_BASED_OUTPATIENT_CLINIC_OR_DEPARTMENT_OTHER)
Admission: EM | Admit: 2023-12-05 | Discharge: 2023-12-06 | Disposition: A | Discharge status: Home / Self Care | Attending: Emergency Medicine | Admitting: Emergency Medicine

## 2023-12-05 DIAGNOSIS — Z7901 Long term (current) use of anticoagulants: Secondary | ICD-10-CM | POA: Diagnosis not present

## 2023-12-05 DIAGNOSIS — Z794 Long term (current) use of insulin: Secondary | ICD-10-CM | POA: Insufficient documentation

## 2023-12-05 DIAGNOSIS — N3000 Acute cystitis without hematuria: Secondary | ICD-10-CM | POA: Diagnosis not present

## 2023-12-05 DIAGNOSIS — R600 Localized edema: Secondary | ICD-10-CM | POA: Diagnosis present

## 2023-12-05 DIAGNOSIS — I110 Hypertensive heart disease with heart failure: Secondary | ICD-10-CM | POA: Diagnosis not present

## 2023-12-05 DIAGNOSIS — Z79899 Other long term (current) drug therapy: Secondary | ICD-10-CM | POA: Insufficient documentation

## 2023-12-05 DIAGNOSIS — I509 Heart failure, unspecified: Secondary | ICD-10-CM | POA: Diagnosis not present

## 2023-12-05 DIAGNOSIS — E11319 Type 2 diabetes mellitus with unspecified diabetic retinopathy without macular edema: Secondary | ICD-10-CM | POA: Insufficient documentation

## 2023-12-05 LAB — URINALYSIS, W/ REFLEX TO CULTURE (INFECTION SUSPECTED)
Bilirubin Urine: NEGATIVE
Glucose, UA: NEGATIVE mg/dL
Hgb urine dipstick: NEGATIVE
Ketones, ur: NEGATIVE mg/dL
Leukocytes,Ua: NEGATIVE
Nitrite: POSITIVE — AB
Protein, ur: 100 mg/dL — AB
Specific Gravity, Urine: >=1.03 (ref 1.005–1.030)
pH: 5.5 (ref 5.0–8.0)

## 2023-12-05 LAB — CBC WITH DIFFERENTIAL/PLATELET
Abs Immature Granulocytes: 0.02 10*3/uL (ref 0.00–0.07)
Basophils Absolute: 0 10*3/uL (ref 0.0–0.1)
Basophils Relative: 0 %
Eosinophils Absolute: 0.2 10*3/uL (ref 0.0–0.5)
Eosinophils Relative: 3 %
HCT: 30.5 % — ABNORMAL LOW (ref 36.0–46.0)
Hemoglobin: 9.5 g/dL — ABNORMAL LOW (ref 12.0–15.0)
Immature Granulocytes: 0 %
Lymphocytes Relative: 25 %
Lymphs Abs: 1.9 10*3/uL (ref 0.7–4.0)
MCH: 26.6 pg (ref 26.0–34.0)
MCHC: 31.1 g/dL (ref 30.0–36.0)
MCV: 85.4 fL (ref 80.0–100.0)
Monocytes Absolute: 0.7 10*3/uL (ref 0.1–1.0)
Monocytes Relative: 9 %
Neutro Abs: 4.6 10*3/uL (ref 1.7–7.7)
Neutrophils Relative %: 63 %
Platelets: 237 10*3/uL (ref 150–400)
RBC: 3.57 MIL/uL — ABNORMAL LOW (ref 3.87–5.11)
RDW: 14 % (ref 11.5–15.5)
WBC: 7.5 10*3/uL (ref 4.0–10.5)
nRBC: 0 % (ref 0.0–0.2)

## 2023-12-05 LAB — BASIC METABOLIC PANEL WITH GFR
Anion gap: 13 (ref 5–15)
BUN: 31 mg/dL — ABNORMAL HIGH (ref 8–23)
CO2: 25 mmol/L (ref 22–32)
Calcium: 9.6 mg/dL (ref 8.9–10.3)
Chloride: 105 mmol/L (ref 98–111)
Creatinine, Ser: 1.25 mg/dL — ABNORMAL HIGH (ref 0.44–1.00)
GFR, Estimated: 47 mL/min — ABNORMAL LOW (ref >60)
Glucose, Bld: 91 mg/dL (ref 70–99)
Potassium: 4 mmol/L (ref 3.5–5.1)
Sodium: 143 mmol/L (ref 135–145)

## 2023-12-05 LAB — PRO BRAIN NATRIURETIC PEPTIDE: Pro Brain Natriuretic Peptide: 1478 pg/mL — ABNORMAL HIGH (ref <300.0)

## 2023-12-05 LAB — TROPONIN T, HIGH SENSITIVITY: Troponin T High Sensitivity: 31 ng/L — ABNORMAL HIGH (ref <19)

## 2023-12-05 MED ORDER — FUROSEMIDE 40 MG PO TABS: 40.0000 mg | ORAL_TABLET | Freq: Every day | ORAL | 0 refills | Status: DC

## 2023-12-05 MED ORDER — CEPHALEXIN 500 MG PO CAPS: 500.0000 mg | ORAL_CAPSULE | Freq: Four times a day (QID) | ORAL | 0 refills | Status: DC

## 2023-12-05 MED ORDER — SODIUM CHLORIDE 0.9 % IV SOLN
1.0000 g | Freq: Once | INTRAVENOUS | Status: AC
  Administered 2023-12-05: 1 g via INTRAVENOUS
  Filled 2023-12-05: qty 10

## 2023-12-05 NOTE — ED Provider Notes (Signed)
 [place] [facility] Provider Note  CSN: [id number] Arrival date & time: 12/05/23 1904  Chief Complaint(s) Leg Swelling (Bilateral)  HPI Joan [name] is a 68 y.o. female who is here today for multiple complaints.  She endorses bilateral lower extremity edema that has been ongoing for several months.  She also endorses UTI symptoms, endorsing burning.  She came in with her husband earlier who is seen as a patient, and after he was discharged, she checked in.  Past Medical History Past Medical History:  Diagnosis Date   Abnormal glucose 05/26/2019   Acid reflux    Acute bronchitis 08/05/2022   Acute pulmonary embolism (HCC) 01/21/2020   Age-related nuclear cataract of both eyes 03/07/2017   Anemia 08/05/2022   Antalgic gait 04/04/2023   Breast lump on left side at 4 o'clock position 06/28/2018   CAP (community acquired pneumonia) 08/05/2022   Chronic pain of both knees 11/02/2018   Class 3 severe obesity without serious comorbidity with body mass index (BMI) of 50.0 to 59.9 in adult 01/26/2020   Complete tear of right rotator cuff 11/12/2016   Cough 01/26/2020   Diabetes mellitus type 2 without retinopathy (HCC) 03/07/2017   Diabetes mellitus without complication (HCC)    Pt states she is prediabetic   DVT of deep femoral vein, left (HCC) 01/26/2020   Essential hypertension 06/28/2018   Fall from slipping on ice 07/26/2019   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   Functional urinary incontinence 06/28/2018   Hypertension    Mixed hyperlipidemia 05/26/2019   Myopia with astigmatism and presbyopia, bilateral 03/07/2017   OSA (obstructive sleep apnea)    Primary localized osteoarthrosis of the knee, right 11/12/2016   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   Sleep apnea 10/10/2009   PSG 5/11 >> mild OSA, predominant RERAs, RDI 13/h     IMO SNOMED Dx Update Oct 2024     Urinary incontinence 11/10/2023   Patient Active  Problem List   Diagnosis Date Noted   Mild aortic stenosis 11/12/2023   Morbid obesity (HCC) 11/12/2023   History of pulmonary embolism 11/12/2023   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   Urinary incontinence 11/10/2023   Acid reflux    Diabetes mellitus without complication (HCC)    Antalgic gait 04/04/2023   CAP (community acquired pneumonia) 08/05/2022   Acute bronchitis 08/05/2022   Anemia 08/05/2022   DVT of deep femoral vein, left (HCC) 01/26/2020   Class 3 severe obesity without serious comorbidity with body mass index (BMI) of 50.0 to 59.9 in adult 01/26/2020   Cough 01/26/2020   Acute pulmonary embolism (HCC) 01/21/2020   Fall from slipping on ice 07/26/2019   Abnormal glucose 05/26/2019   Mixed hyperlipidemia 05/26/2019   Chronic pain of both knees 11/02/2018   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 Authorizing Provider  cephALEXin (KEFLEX) 500 MG capsule Take 1 capsule (500 mg total) by mouth 4 (four) times daily. 12/05/23  Yes  Nathanael Baker, DO  furosemide (LASIX) 40 MG tablet Take 1 tablet (40 mg total) by mouth daily for 7 days. 12/05/23 12/12/23 Yes Afton Horse T, DO  albuterol  (VENTOLIN  HFA) 108 [id number] Base) MCG/ACT inhaler Inhale 1-2 puffs into the lungs every 6 (six) hours as needed for wheezing or shortness of breath. 06/30/22   Long, Shereen Dike, MD  apixaban  (ELIQUIS ) 5 MG TABS tablet Take 1  tablet (5 mg total) by mouth 2 (two) times daily. 11/20/20   Gudena, Vinay, MD  ASCORBIC ACID PO Take 1 tablet by mouth daily.    [provider]  CYCLOBENZAPRINE HCL PO Take 1 tablet by mouth at bedtime as needed.    [provider]  doxycycline  (VIBRA -TABS) 100 MG tablet Take 1 tablet (100 mg total) by mouth 2 (two) times daily. 07/30/23   Margaretann Sharper, MD  ezetimibe -simvastatin  (VYTORIN ) 10-40 MG tablet Take 1 tablet by mouth daily. 04/27/20   Susanna Epley, FNP  Fe Fum-FePoly-FA-Vit C-Vit B3 (INTEGRA F ) 125-1 MG CAPS Take 1 tablet by mouth daily. 12/07/20   Susanna Epley, FNP  GEMTESA 75 MG TABS Take 1 tablet by mouth daily.    [provider]  guaiFENesin -dextromethorphan (ROBITUSSIN DM) 100-10 MG/5ML syrup Take 5 mLs by mouth every 6 (six) hours as needed for cough. 09/04/23   Cobb, Mariah Shines, NP  hydrALAZINE (APRESOLINE) 25 MG tablet Take by mouth.    [provider]  Insulin Pen Needle (PEN NEEDLES) 32G X 4 MM MISC 1 each by Does not apply route daily. Use as directed daily with saxenda  08/03/19   Susanna Epley, FNP  lidocaine  (LIDODERM ) 5 % Place 1 patch onto the skin daily. Remove & Discard patch within 12 hours or as directed by MD 05/30/21   Maralee Senate, April, MD  lisinopril -hydrochlorothiazide  (ZESTORETIC ) 20-25 MG tablet Take 1 tablet by mouth daily. 11/29/20   Susanna Epley, FNP  methocarbamol  (ROBAXIN ) 500 MG tablet Take 1 tablet (500 mg total) by mouth 2 (two) times daily. 05/30/21   Palumbo, April, MD  methocarbamol  (ROBAXIN ) 750 MG tablet Take 750 mg by mouth 2 (two) times daily as needed for muscle spasms.    [provider]  Multiple Vitamins-Minerals (MULTIVITAMIN PO) Take 1 tablet by mouth daily.    [provider]  MYRBETRIQ 50 MG TB24 tablet Take 50 mg by mouth daily. 11/08/19   [provider]  omeprazole (PRILOSEC) 40 MG capsule Take 40 mg by mouth daily.    [provider]  solifenacin  (VESICARE ) 10 MG  tablet Take 1 tablet by mouth once daily 08/05/19   Moore, [name], FNP  traMADol  (ULTRAM ) 50 MG tablet Take 1 tablet (50 mg total) by mouth every 6 (six) hours as needed. 11/20/20   Gudena, Vinay, MD  Vitamin D , Ergocalciferol , (DRISDOL ) 1.25 MG (50000 UNIT) CAPS capsule Take 1 capsule (50,000 Units total) by mouth every 7 (seven) days. 08/29/23   Susanna Epley, FNP  Past Surgical History Past Surgical History:  Procedure Laterality Date   ABDOMINAL HYSTERECTOMY     TONSILECTOMY, ADENOIDECTOMY, BILATERAL MYRINGOTOMY AND TUBES     Family History Family History  Problem Relation Age of Onset   Heart disease Mother    Heart disease Father    Prostate cancer Father     Social History Social History   Tobacco Use   Smoking status: Never   Smokeless tobacco: Never  Substance Use Topics   Alcohol use: No   Drug use: No   Allergies Penicillins  Review of Systems Review of Systems  Physical Exam Vital Signs  I have reviewed the triage vital signs BP (!) 170/101 (BP Location: Left Arm)   Pulse 82   Temp 99.4 F (37.4 C) (Oral)   Resp (!) 30   SpO2 97%   Physical Exam Vitals and nursing note reviewed.  HENT:     Head: Normocephalic and atraumatic.  Eyes:     Pupils: Pupils are equal, round, and reactive to light.  Cardiovascular:     Rate and Rhythm: Normal rate.     Pulses: Normal pulses.  Musculoskeletal:     Cervical back: Normal range of motion.     Right lower leg: Edema present.     Left lower leg: Edema present.  Neurological:     General: No focal deficit present.     Mental Status: She is alert.     ED Results and Treatments Labs (all labs ordered are listed, but only abnormal results are displayed) Labs Reviewed  CBC WITH DIFFERENTIAL/PLATELET - Abnormal; Notable for the following components:      Result Value   RBC 3.57 (*)     Hemoglobin 9.5 (*)    HCT 30.5 (*)    All other components within normal limits  BASIC METABOLIC PANEL WITH GFR - Abnormal; Notable for the following components:   BUN 31 (*)    Creatinine, Ser 1.25 (*)    GFR, Estimated 47 (*)    All other components within normal limits  PRO BRAIN NATRIURETIC PEPTIDE - Abnormal; Notable for the following components:   Pro Brain Natriuretic Peptide 1,478.0 (*)    All other components within normal limits  URINALYSIS, W/ REFLEX TO CULTURE (INFECTION SUSPECTED) - Abnormal; Notable for the following components:   APPearance CLOUDY (*)    Protein, ur 100 (*)    Nitrite POSITIVE (*)    Bacteria, UA MANY (*)    All other components within normal limits  TROPONIN T, HIGH SENSITIVITY - Abnormal; Notable for the following components:   Troponin T High Sensitivity 31 (*)    All other components within normal limits  URINE CULTURE                                                                                                                          Radiology DG Chest 1 View Result Date: 12/05/2023 CLINICAL DATA:  Worsening bilateral leg swelling EXAM:  CHEST  1 VIEW COMPARISON:  Rib radiographs 07/30/2023 FINDINGS: Cardiomegaly. Pulmonary vascular congestion. Hazy airspace and interstitial opacities with a mid and lower lung predominance compatible with pulmonary edema. No definite pleural effusion. No pneumothorax. No displaced rib fractures. IMPRESSION: Cardiomegaly with pulmonary edema. Electronically Signed   By: Rozell Cornet M.D.   On: 12/05/2023 22:37    Pertinent labs & imaging results that were available during my care of the patient were reviewed by me and considered in my medical decision making (see MDM for details).  Medications Ordered in ED Medications  cefTRIAXone (ROCEPHIN) 1 g in sodium chloride  0.9 % 100 mL IVPB (has no administration in time range)                                                                                                                                      Procedures Procedures  (including critical care time)  Medical Decision Making / ED Course   This patient presents to the ED for concern of UTI, lower extremity edema., this involves an extensive number of treatment options, and is a complaint that carries with it a high risk of complications and morbidity.  The differential diagnosis includes lymphedema, less likely heart failure, cystitis.  MDM: Patient overall well-appearing.  She has equal bilateral lower extremity edema.  This been ongoing for several months.  She has previously been diagnosed with lymphedema.  She states that she does not use compression socks because "they hurt too much."  Patient on Eliquis .  Will check chest x-ray, BNP on the patient.  Counseled her on compression socks.  Her urine is positive for UTI.  Will give dose of Rocephin, discharged with Macrobid .  Reassessment 11 PM-patient with some pulmonary vascular congestion, elevated BNP.  She has not required any supplemental O2, is able speak in complete sentences, nontachypneic in the room.  Will have the patient increase her Lasix over the next few days and follow-up with her PCP.  Mildly elevated troponin in the setting of a an increased BNP less likely to be ACS.  No chest pain.  Nonischemic EKG.     Additional history obtained: -Additional history obtained from husband at bedside -External records from outside source obtained and reviewed including: Chart review including previous notes, labs, imaging, consultation notes   Lab Tests: -I ordered, reviewed, and interpreted labs.   The pertinent results include:   Labs Reviewed  CBC WITH DIFFERENTIAL/PLATELET - Abnormal; Notable for the following components:      Result Value   RBC 3.57 (*)    Hemoglobin 9.5 (*)    HCT 30.5 (*)    All other components within normal limits  BASIC METABOLIC PANEL WITH GFR - Abnormal; Notable for the following components:    BUN 31 (*)    Creatinine, Ser 1.25 (*)    GFR, Estimated 47 (*)    All other components within normal limits  PRO BRAIN NATRIURETIC PEPTIDE - Abnormal; Notable for the following components:   Pro Brain Natriuretic Peptide 1,478.0 (*)    All other components within normal limits  URINALYSIS, W/ REFLEX TO CULTURE (INFECTION SUSPECTED) - Abnormal; Notable for the following components:   APPearance CLOUDY (*)    Protein, ur 100 (*)    Nitrite POSITIVE (*)    Bacteria, UA MANY (*)    All other components within normal limits  TROPONIN T, HIGH SENSITIVITY - Abnormal; Notable for the following components:   Troponin T High Sensitivity 31 (*)    All other components within normal limits  URINE CULTURE      EKG   EKG Interpretation Date/Time:  Friday Dec 05 2023 22:18:09 EDT Ventricular Rate:  83 PR Interval:  213 QRS Duration:  105 QT Interval:  420 QTC Calculation: 494 R Axis:   60  Text Interpretation: Sinus rhythm Borderline prolonged PR interval Nonspecific T abnormalities, lateral leads Borderline prolonged QT interval Confirmed by Afton Horse [id number]) on 12/05/2023 10:30:56 PM         Imaging Studies ordered: I ordered imaging studies including chest x-ray I independently visualized and interpreted imaging. I agree with the radiologist interpretation   Medicines ordered and prescription drug management: Meds ordered this encounter  Medications   cefTRIAXone (ROCEPHIN) 1 g in sodium chloride  0.9 % 100 mL IVPB    Antibiotic Indication::   UTI   cephALEXin (KEFLEX) 500 MG capsule    Sig: Take 1 capsule (500 mg total) by mouth 4 (four) times daily.    Dispense:  28 capsule    Refill:  0   furosemide (LASIX) 40 MG tablet    Sig: Take 1 tablet (40 mg total) by mouth daily for 7 days.    Dispense:  7 tablet    Refill:  0    -I have reviewed the patients home medicines and have made adjustments as needed  Cardiac Monitoring: The patient was maintained on a cardiac  monitor.  I personally viewed and interpreted the cardiac monitored which showed an underlying rhythm of: Normal sinus rhythm  Social Determinants of Health:  Factors impacting patients care include: Lack of access to primary care   Reevaluation: After the interventions noted above, I reevaluated the patient and found that they have :improved  Co morbidities that complicate the patient evaluation  Past Medical History:  Diagnosis Date   Abnormal glucose 05/26/2019   Acid reflux    Acute bronchitis 08/05/2022   Acute pulmonary embolism (HCC) 01/21/2020   Age-related nuclear cataract of both eyes 03/07/2017   Anemia 08/05/2022   Antalgic gait 04/04/2023   Breast lump on left side at 4 o'clock position 06/28/2018   CAP (community acquired pneumonia) 08/05/2022   Chronic pain of both knees 11/02/2018   Class 3 severe obesity without serious comorbidity with body mass index (BMI) of 50.0 to 59.9 in adult 01/26/2020   Complete tear of right rotator cuff 11/12/2016   Cough 01/26/2020   Diabetes mellitus type 2 without retinopathy (HCC) 03/07/2017   Diabetes mellitus without complication (HCC)    Pt states she is prediabetic   DVT of deep femoral vein, left (HCC) 01/26/2020   Essential hypertension 06/28/2018   Fall from slipping on ice 07/26/2019   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   Functional urinary incontinence 06/28/2018   Hypertension    Mixed hyperlipidemia 05/26/2019   Myopia with astigmatism and presbyopia, bilateral 03/07/2017   OSA (obstructive sleep apnea)  Primary localized osteoarthrosis of the knee, right 11/12/2016   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     Final  Clinical Impression(s) / ED Diagnoses Final diagnoses:  Acute on chronic congestive heart failure, unspecified heart failure type (HCC)  Acute cystitis without hematuria     @PCDICTATION @    Afton Horse T, DO 12/05/23 2307

## 2023-12-05 NOTE — ED Triage Notes (Addendum)
 Pt reports bilateral leg swelling below the knee x 5 weeks progressively worsening. No CP/SOB. HX DVTs, reports compliance with prescribed Eliquis  and lasix.

## 2023-12-05 NOTE — Discharge Instructions (Signed)
 While you were in the emergency room, you had test done that showed that you had a little bit of extra fluid on your lungs.  This is usually related to your heart.  I am increasing your Lasix dose to 40 mg each day for the next 1 week.  Please follow-up with your cardiologist within 1 to 2 weeks to discuss continued management of your extra fluid.  You did have a urinary tract infection.  I sent you a prescription for Keflex.  You can begin taking this 4 times per day for the next 1 week.  Follow-up with your primary care doctor.  Return to the emergency room if you develop increased difficulty with your breathing fever or confusion.

## 2023-12-05 NOTE — ED Notes (Signed)
 Pt choose to go sit with her husband who is currently a pt in room 7

## 2023-12-08 LAB — URINE CULTURE: Culture: >=100000 — AB

## 2023-12-09 ENCOUNTER — Telehealth (HOSPITAL_BASED_OUTPATIENT_CLINIC_OR_DEPARTMENT_OTHER): Payer: Self-pay | Admitting: *Deleted

## 2023-12-09 NOTE — Telephone Encounter (Signed)
 Post ED Visit - Positive Culture Follow-up  Culture report reviewed by antimicrobial stewardship pharmacist: [facility] [x]  [place], [place].D. []  Skeet Duke, Pharm.D., BCPS AQ-ID []  Leslee Rase, Pharm.D., BCPS []  Garland Junk, Pharm.D., BCPS []  [place], [address].D., BCPS, AAHIVP []  Alcide Aly, Pharm.D., BCPS, AAHIVP []  Jerri Morale, PharmD, BCPS []  Graham Laws, PharmD, BCPS []  Cleda Curly, PharmD, BCPS []  Tamar Fairly, PharmD []  Ballard Levels, PharmD, BCPS []  Ollen Beverage, PharmD  [facility] []  Arlyne Bering, PharmD []  Sherryle Don, PharmD []  [place], PharmD []  Delila Felty, Rph []  [place]) Cleora Daft, PharmD []  Augustina Block, PharmD []  Arie Kurtz, PharmD []  Sharlyn Deaner, PharmD []  Agnes Hose, PharmD []  Kendall Pauls, PharmD []  Gladstone Lamer, PharmD []  Armanda Bern, PharmD []  Tera Fellows, PharmD   Positive urine culture Treated with cephalexin, organism sensitive to the same and no further patient follow-up is required at this time.  Zeb Heys 12/09/2023, 7:47 AM

## 2023-12-11 ENCOUNTER — Ambulatory Visit (HOSPITAL_BASED_OUTPATIENT_CLINIC_OR_DEPARTMENT_OTHER)
Admission: RE | Admit: 2023-12-11 | Discharge: 2023-12-11 | Disposition: A | Discharge status: Home / Self Care | Source: Ambulatory Visit | Attending: Cardiology | Admitting: Cardiology

## 2023-12-11 DIAGNOSIS — I350 Nonrheumatic aortic (valve) stenosis: Secondary | ICD-10-CM | POA: Diagnosis present

## 2023-12-12 LAB — ECHOCARDIOGRAM COMPLETE
AR max vel: 1.69 cm2
AV Area VTI: 1.89 cm2
AV Area mean vel: 1.84 cm2
AV Mean grad: 14.5 mmHg
AV Peak grad: 26.3 mmHg
Ao pk vel: 2.57 m/s
Area-P 1/2: 7.99 cm2
Calc EF: 58.7 %
MV M vel: 4.66 m/s
MV Peak grad: 86.9 mmHg
S' Lateral: 3.1 cm
Single Plane A2C EF: 59.3 %
Single Plane A4C EF: 58.3 %

## 2023-12-15 ENCOUNTER — Telehealth: Payer: Self-pay

## 2023-12-15 ENCOUNTER — Ambulatory Visit: Payer: Self-pay | Admitting: Cardiology

## 2023-12-15 NOTE — Telephone Encounter (Signed)
 Copied from CRM [id number]. Topic: Clinical - Order For Equipment >> Dec 15, 2023  1:58 PM Isabell A wrote: Reason for CRM: Patient states [facility] has not received the CPAP machine order.   Correct fax number: [phone]  Patient requesting a call back with an update.   Cpap order states it was received in January 2025. Sending community message to [employer].

## 2023-12-25 NOTE — Telephone Encounter (Signed)
Results reviewed with pt as per Dr. Revankar's note.  Pt verbalized understanding and had no additional questions. Routed to PCP.  

## 2024-03-26 ENCOUNTER — Ambulatory Visit: Admitting: Pulmonary Disease

## 2024-03-26 ENCOUNTER — Encounter: Payer: Self-pay | Admitting: Pulmonary Disease

## 2024-03-29 NOTE — Progress Notes (Signed)
 [facility] Established Patient Clinic Note  Assessment/Plan: Ms.Haire is a 68 y.o.female  Coti was seen today for follow-up.  Diagnoses and all orders for this visit:  Stage 3b chronic kidney disease (CMS-HCC) -     Nephrology; Future -     Basic metabolic panel; Future  Essential hypertension  Heart failure with preserved ejection fraction, unspecified HF chronicity    (CMS-HCC)  OSA (obstructive sleep apnea)  Other orders -     empagliflozin (JARDIANCE) 10 mg tablet; Take 1 tablet (10 mg total) by mouth daily. -     tirzepatide, weight loss, 5 mg/0.5 mL Soln; Inject 5 mg under the skin once a week. -     losartan-hydroCHLOROthiazide  (HYZAAR) 100-25 mg per tablet; Take 1 tablet by mouth daily.     Assessment & Plan Chronic congestive heart failure Her chronic congestive heart failure is managed with medications, including furosemide , and natural supplements to improve circulation. Symptoms have improved, but some congestion persists. She uses a natural supplement, Circular Flow, which she believes aids circulation and reduces arterial plaque.  HTN Reports dry cough with lisinopril . BP controlled at todays appointmnet (129/79) - SWITCH from Prinzide  to Hyzaar (100-25mg  daily)  Edema Edema is likely related to chronic congestive heart failure versus obesity Furosemide  has improved symptoms, allowing easier toe movement. She uses a balm cream for pain relief and natural supplements for circulation.  - Continue furosemide  40 mg oral daily - Encourage elevation of legs to reduce swelling - Continue weight loss with Mounjaro  Stage 3b CKD Continues to have increased SCr, BUN and eGFR. Patient would benefit from consultation with nephrology for management of her decreased kidney function in the context of CHF. - Start empagliflozin 10mg  daily - Nephrology consultation  OSA History of CPAP but does not have a current machine. Working with  her pulmonologist to get it replaced has been unsuccessful.  - Care management messaged to help obtain CPAP  Urinary incontinence Urinary incontinence occurs primarily at night due to fluid redistribution when supine. She uses multiple incontinence products, including pull-ups and poise pads, using about six pads during the day and more at night. - Continue use of incontinence products as needed  Diabetes mellitus type 2 Diabetes is managed with Mounjaro, recently started. A1c increased slightly from 5.5 to 5.7, but the new medication is expected to stabilize blood glucose levels. Monitoring kidney function is necessary due to diuretic use. - INCREASE Mounjaro to 5mg  from 2.5mg  - Recheck A1c in future visits  General Health Maintenance She has not received her flu shot this year and is considering a COVID-19 booster. She has concerns about previous adverse reactions to the COVID-19 vaccine, including blood clots, and is advised to weigh the risks and benefits.  Follow-up Follow-up is required for chronic conditions and to monitor treatment effectiveness. Coordination with the pharmacy is necessary for medication refills and supplies. - Schedule follow-up appointment to reassess chronic conditions - Coordinate with pharmacy for medication refills and supplies   Results for orders placed or performed in visit on 01/08/24  BUN  Result Value Ref Range   BUN 32 (H) 9 - 23 mg/dL  Creatinine  Result Value Ref Range   Creatinine 1.63 (H) 0.55 - 1.02 mg/dL   eGFR CKD-EPI (7978) Female 35 (L) >=60 mL/min/1.60m2      Subjective Ms. Homer is a 68 y.o. female  coming to clinic today for the following issues:  Chief Complaint  Patient presents with  .  Follow-up    Follow up on fluid in both legs    HPI:  History of Present Illness Zelphia Glover is a 68 year old female who presents with edema and ongoing cough. She is accompanied by a family member, Mr. Ubaldo.  She has been  experiencing edema of lower extremities which has been going on for months, patient describes a waxing/waning tightness in the tissue, but no change in the overall size of the swelling. She takes multiple supplements (circular flow - tumeric, peppers; oil of oregano and pumpkin seed oil. Additionally, she uses balm cream for pain relief and to prevent stinging. Her toes are more mobile, and she applies frankincense for redness.  Patient experiences urinary incontinence, mostly at night due to not having enough time to make it to the bathroom. She take her furosemide  in the AM but doesn't have the urinary urgency until evening. She uses heavy-duty overnight poise pads, size six or eight, to manage the incontinence.   She reports a persistent cough with yellow sputum despite previous treatment with prednisone  and a Z-Pak. The cough worsens when lying down or turning in bed and she describes it as a dry cough.    Results LABS Cholesterol: 188 mg/dL LDL: 877 mg/dL Hemoglobin: 10 g/dL Hemoglobin J8r: 4.2%   Review of Systems  All other systems reviewed and are negative.    I have reviewed the problem list, medications, and allergies and have updated/reconciled them if needed.  Ms. Caraveo  reports that she has never smoked. She has never used smokeless tobacco. Health Maintenance  Topic Date Due  . Medicare Annual Wellness Visit (AWV)  Never done  . DEXA Scan  Never done  . DTaP/Tdap/Td Vaccines (1 - Tdap) Never done  . Zoster Vaccines (1 of 2) Never done  . Pneumococcal Vaccine 50+ (2 of 2 - PCV) 06/29/2021  . Mammogram  01/02/2022  . Foot Exam  03/22/2022  . Colon Cancer Screening  11/22/2022  . Retinal Eye Exam  11/22/2023  . COVID-19 Vaccine (3 - 2025-26 season) 03/15/2024  . Influenza Vaccine (1) 03/15/2024  . Hemoglobin A1c  06/25/2024  . Urine Albumin/Creatinine Ratio  12/24/2024  . Potassium Monitoring  12/24/2024  . Serum Creatinine Monitoring  01/07/2025  . Hepatitis C  Screen  Completed    Objective  VITALS: BP 129/79 (BP Position: Sitting, BP Cuff Size: Thigh)   Pulse 82   Temp 36.6 C (97.8 F) (Temporal)   Wt (!) 175.5 kg (387 lb)   LMP  (LMP Unknown)   BMI 61.49 kg/m   Physical Exam Vitals and nursing note reviewed.  Constitutional:      Appearance: Normal appearance. She is obese. She is not ill-appearing.  Cardiovascular:     Rate and Rhythm: Normal rate and regular rhythm.  Pulmonary:     Effort: Pulmonary effort is normal.     Breath sounds: Normal breath sounds. No rales.  Musculoskeletal:     Right lower leg: Edema present.     Left lower leg: Edema present.  Neurological:     Mental Status: She is alert.     LABS/IMAGING I have reviewed pertinent recent labs and imaging in Epic  [facility]  at [facility] [address] [place], [place] [address] . Telephone [phone] . Fax [phone] [email or web address]

## 2024-04-01 NOTE — Progress Notes (Addendum)
 [facility] Care Management Progress Note  Date: 04/01/2024 Outcome:  1st outreach attempt, left voicemail. CM will make further attempts at outreach.   Is this patient in [place] ? Yes  Purpose of contact:         Following up on request by provider on 03/29/24 to provide CPAP for patient. CM did chart review before contacting pt and noticed patient has been in contact with [facility] in [name], [place]. Pt states that [facility] (CPAP providers) never received order despite records from [place] showing the orders had been received in January 2025. CM called pt to follow up and learn of any new information and potentially provide support in receiving a new CPAP. CM was unable to leave voicemail and will attempt contact again.   MyChart message was sent on 9/19.    Health Maintenance: Health Maintenance Due  Topic Date Due  . Medicare Annual Wellness Visit (AWV)  Never done  . DEXA Scan  Never done  . DTaP/Tdap/Td Vaccines (1 - Tdap) Never done  . Zoster Vaccines (1 of 2) Never done  . Pneumococcal Vaccine 50+ (2 of 2 - PCV) 06/29/2021  . Mammogram  01/02/2022  . Foot Exam  03/22/2022  . Colon Cancer Screening  11/22/2022  . Retinal Eye Exam  11/22/2023  . COVID-19 Vaccine (3 - 2025-26 season) 03/15/2024  . Influenza Vaccine (1) 03/15/2024    Additional Information/Plan: Patient provided my direct contact information and encouraged to contact me should additional needs arise.  Time Spent Per Day: Chart review was completed prior to outreach attempt.  04/01/2024: 20 04/02/2024: 10   Horacio Lecher, CMA [facility] [place]

## 2024-04-20 ENCOUNTER — Ambulatory Visit: Payer: Self-pay | Admitting: Pulmonary Disease

## 2024-04-20 ENCOUNTER — Telehealth: Payer: Self-pay | Admitting: *Deleted

## 2024-04-20 NOTE — Telephone Encounter (Signed)
 ATC 1x Left VM

## 2024-04-20 NOTE — Telephone Encounter (Signed)
 Patient has not had her CPAP since February--the one she has doesn't work Patient states [facility] is not getting it fixed and every time Dr Voncile sends in for CPAP they dont send her one.  Worse for about a month  FYI Only or Action Required?: Action required by provider: clinical question for provider, update on patient condition, and help getting another CPAP to use---hasn't had one working since Feb.  Patient is followed in Pulmonology for shortness of breath, OSA, last seen on 11/04/2023 by Neda Jennet LABOR, MD.  Called Nurse Triage reporting Shortness of Breath.  Symptoms began about a month ago.  Interventions attempted: OTC medications: delsym, Prescription medications: albuterol , and Rescue inhaler.  Symptoms are: gradually worsening.  Triage Disposition: See PCP When Office is Open (Within 3 Days)  Patient/caregiver understands and will follow disposition?: No, wishes to speak with PCP            Copied from CRM #[id number]. Topic: Clinical - Red Word Triage >> Apr 20, 2024  8:28 AM Isabell A wrote: Red Word that prompted transfer to Nurse Triage: Patient states she's having breathing problems while laying down - Also, legs are swelling (spoke with PCP about this). Reason for Disposition  [1] MODERATE longstanding difficulty breathing (e.g., speaks in phrases, SOB even at rest, pulse 100-120) AND [2] SAME as normal  Answer Assessment - Initial Assessment Questions Shortness of Breath Patient has tried positioning better Patient denies chest pain Patient saw her PCP on 03/29/2024 and nothing is different or worse since then Patient states she is just calling because she is without her CPAP machine and hasn't had it since Feb Patient speaking in full complete sentences during triage  Used inhaler this morning PCP is having surgery Bethene is supposed to speak with Dr MALVA She is trying to get the patient a CPAP machine [facility] is not following  through Pt has been to Dr Voncile twice  Swelling in both legs 2 months ago her furosemide  dose was increased Patient is no longer on lisinopril   Patient has not had her CPAP since February--the one she has doesn't work Patient states [facility] is not getting it fixed and every time Dr Voncile sends in for CPAP they dont send her one. Patient is advised to call us  back if anything changes or if she has any questions or concerns Patient is given the phone number to [facility] and she states she is going to try to call them and she would like the pulmonary office's assistance on getting a CPAP to help her. She was coughing at night but she got some delsym & states this resolved  Patient is advised to call us  back with any questions/concerns/changes She is also advised that if she gets worse to call us  or go to the Emergency Room She verbalized understanding  E2C2 Pulmonary Triage - Initial Assessment Questions  Have you tested for COVID or Flu? Note: If not, ask patient if a home test can be taken. If so, instruct patient to call back for positive results. No  MEDICINES:   Have you used any OTC meds to help with symptoms? Yes If yes, ask What medications? Delsym  Have you used your inhalers/maintenance medication? Yes If yes, What medications? Albuterol  Inhaler--Inhale 1-2 puffs into the lungs every 6 (six) hours as needed for wheezing or shortness of breath.   If inhaler, ask How many puffs and how often? Note: Review instructions on medication in the chart. Albuterol  Inhaler--Inhale 1-2  puffs into the lungs every 6 (six) hours as needed for wheezing or shortness of breath.   OXYGEN: Do you wear supplemental oxygen? No If yes, How many liters are you supposed to use? N/a  Do you monitor your oxygen levels? No If yes, What is your reading (oxygen level) today? N/a ---pt thought she had a portable pulse ox but couldn't find it  What is your usual  oxygen saturation reading?  (Note: Pulmonary O2 sats should be 90% or greater) N/a     1. RESPIRATORY STATUS: Describe your breathing? (e.g., wheezing, shortness of breath, unable to speak, severe coughing)      Shortness of breath, fluid in both legs 2. ONSET: When did this breathing problem begin?      About a month ago 3. PATTERN Does the difficult breathing come and go, or has it been constant since it started?      Worse with certain positions 4. SEVERITY: How bad is your breathing? (e.g., mild, moderate, severe)      Patient states it is worse 5. RECURRENT SYMPTOM: Have you had difficulty breathing before? If Yes, ask: When was the last time? and What happened that time?      yes 6. CARDIAC HISTORY: Do you have any history of heart disease? (e.g., heart attack, angina, bypass surgery, angioplasty)      significant 7. LUNG HISTORY: Do you have any history of lung disease?  (e.g., pulmonary embolus, asthma, emphysema)     significant 8. CAUSE: What do you think is causing the breathing problem?      Not having cpap 9. OTHER SYMPTOMS: Do you have any other symptoms? (e.g., chest pain, cough, dizziness, fever, runny nose)     Swelling in both lower extremities---put on medication  Protocols used: Breathing Difficulty-A-AH

## 2024-04-20 NOTE — Telephone Encounter (Signed)
 Patient is concerned about her cpap order and that the lack of a cpap is causing her to be tired in the day and experience shortness of breath. She declined an acute appointment for th shortness of breath.

## 2024-04-20 NOTE — Telephone Encounter (Signed)
 ATC Brad ([employer]) to find out why I cannot pull a DL from [facility] for the past 30 days, the data stops at 07/02/23.  Says no days of data.  Asked that he return my call and left my direct number.

## 2024-04-20 NOTE — Telephone Encounter (Signed)
ATC patient x1.  LVM to return call. 

## 2024-04-21 NOTE — Telephone Encounter (Signed)
 Called and spoke with patient, she states she got her machine and something was wrong with it, they provided a part for it and it still was not working.  She has still not been able to wear it.  She said she is trying to get [facility] to fix it so she can wear it.  She said she either got it the end of 2024 or the beginning of 2025.  Advised I would call [employer] to see what needs to be done and then call her back.  She verbalized understanding.  I called [employer] and spoke with Brad.  He states that they need to do trouble shooting to see what is wrong with the machine.  He said as long as the machine is still under warranty it should not be a problem.  He said he is sending a message to the [employer] branch and they should be contacting the patient regarding the trouble shooting.  I called and spoke with the patient, provided information per [facility].  She verbalized understanding.  Patient was set up on 03/26/23, the last data available is from 07/02/2023.  Nothing further needed.

## 2024-05-05 ENCOUNTER — Ambulatory Visit

## 2024-05-05 DIAGNOSIS — R0602 Shortness of breath: Secondary | ICD-10-CM

## 2024-05-05 LAB — PULMONARY FUNCTION TEST
DL/VA % pred: 100 %
DL/VA: 4.2 ml/min/mmHg/L
DLCO unc % pred: 71 %
DLCO unc: 14.18 ml/min/mmHg
FEF 25-75 Post: 1.05 L/s
FEF 25-75 Pre: 0.66 L/s
FEF2575-%Change-Post: 59 %
FEF2575-%Pred-Post: 50 %
FEF2575-%Pred-Pre: 31 %
FEV1-%Change-Post: 11 %
FEV1-%Pred-Post: 57 %
FEV1-%Pred-Pre: 51 %
FEV1-Post: 1.37 L
FEV1-Pre: 1.22 L
FEV1FVC-%Change-Post: 10 %
FEV1FVC-%Pred-Pre: 88 %
FEV6-%Change-Post: 1 %
FEV6-%Pred-Post: 60 %
FEV6-%Pred-Pre: 59 %
FEV6-Post: 1.81 L
FEV6-Pre: 1.79 L
FEV6FVC-%Change-Post: 0 %
FEV6FVC-%Pred-Post: 104 %
FEV6FVC-%Pred-Pre: 103 %
FVC-%Change-Post: 0 %
FVC-%Pred-Post: 57 %
FVC-%Pred-Pre: 57 %
FVC-Post: 1.81 L
FVC-Pre: 1.8 L
Post FEV1/FVC ratio: 75 %
Post FEV6/FVC ratio: 100 %
Pre FEV1/FVC ratio: 68 %
Pre FEV6/FVC Ratio: 100 %

## 2024-05-05 NOTE — Progress Notes (Signed)
 Full pft w/o pleth. Pleth could not be completed.

## 2024-05-05 NOTE — Patient Instructions (Signed)
 Full pft w/o pleth. Pleth could not be completed.

## 2024-05-11 ENCOUNTER — Other Ambulatory Visit: Payer: Self-pay

## 2024-05-11 ENCOUNTER — Emergency Department (HOSPITAL_BASED_OUTPATIENT_CLINIC_OR_DEPARTMENT_OTHER)

## 2024-05-11 ENCOUNTER — Inpatient Hospital Stay (HOSPITAL_BASED_OUTPATIENT_CLINIC_OR_DEPARTMENT_OTHER)
Admission: EM | Admit: 2024-05-11 | Discharge: 2024-05-21 | DRG: 291 | Disposition: A | Discharge status: Home Health | Attending: Hospitalist | Admitting: Hospitalist

## 2024-05-11 DIAGNOSIS — E782 Mixed hyperlipidemia: Secondary | ICD-10-CM | POA: Diagnosis present

## 2024-05-11 DIAGNOSIS — E66813 Obesity, class 3: Secondary | ICD-10-CM | POA: Diagnosis present

## 2024-05-11 DIAGNOSIS — N1832 Chronic kidney disease, stage 3b: Secondary | ICD-10-CM | POA: Diagnosis present

## 2024-05-11 DIAGNOSIS — I441 Atrioventricular block, second degree: Secondary | ICD-10-CM | POA: Diagnosis present

## 2024-05-11 DIAGNOSIS — I5030 Unspecified diastolic (congestive) heart failure: Secondary | ICD-10-CM | POA: Diagnosis present

## 2024-05-11 DIAGNOSIS — Z86711 Personal history of pulmonary embolism: Secondary | ICD-10-CM

## 2024-05-11 DIAGNOSIS — I5043 Acute on chronic combined systolic (congestive) and diastolic (congestive) heart failure: Secondary | ICD-10-CM | POA: Diagnosis present

## 2024-05-11 DIAGNOSIS — N183 Chronic kidney disease, stage 3 unspecified: Secondary | ICD-10-CM | POA: Diagnosis present

## 2024-05-11 DIAGNOSIS — I509 Heart failure, unspecified: Secondary | ICD-10-CM | POA: Diagnosis not present

## 2024-05-11 DIAGNOSIS — I350 Nonrheumatic aortic (valve) stenosis: Secondary | ICD-10-CM | POA: Diagnosis present

## 2024-05-11 DIAGNOSIS — K219 Gastro-esophageal reflux disease without esophagitis: Secondary | ICD-10-CM | POA: Diagnosis present

## 2024-05-11 DIAGNOSIS — Z7985 Long-term (current) use of injectable non-insulin antidiabetic drugs: Secondary | ICD-10-CM

## 2024-05-11 DIAGNOSIS — I5031 Acute diastolic (congestive) heart failure: Secondary | ICD-10-CM | POA: Diagnosis present

## 2024-05-11 DIAGNOSIS — E113591 Type 2 diabetes mellitus with proliferative diabetic retinopathy without macular edema, right eye: Secondary | ICD-10-CM | POA: Diagnosis present

## 2024-05-11 DIAGNOSIS — Z8249 Family history of ischemic heart disease and other diseases of the circulatory system: Secondary | ICD-10-CM

## 2024-05-11 DIAGNOSIS — Q2112 Patent foramen ovale: Secondary | ICD-10-CM

## 2024-05-11 DIAGNOSIS — E119 Type 2 diabetes mellitus without complications: Secondary | ICD-10-CM

## 2024-05-11 DIAGNOSIS — I5033 Acute on chronic diastolic (congestive) heart failure: Principal | ICD-10-CM | POA: Diagnosis present

## 2024-05-11 DIAGNOSIS — Z6841 Body Mass Index (BMI) 40.0 and over, adult: Secondary | ICD-10-CM

## 2024-05-11 DIAGNOSIS — I2489 Other forms of acute ischemic heart disease: Secondary | ICD-10-CM | POA: Diagnosis not present

## 2024-05-11 DIAGNOSIS — E1122 Type 2 diabetes mellitus with diabetic chronic kidney disease: Secondary | ICD-10-CM | POA: Diagnosis present

## 2024-05-11 DIAGNOSIS — Z7984 Long term (current) use of oral hypoglycemic drugs: Secondary | ICD-10-CM

## 2024-05-11 DIAGNOSIS — N1831 Chronic kidney disease, stage 3a: Secondary | ICD-10-CM | POA: Diagnosis present

## 2024-05-11 DIAGNOSIS — E861 Hypovolemia: Secondary | ICD-10-CM

## 2024-05-11 DIAGNOSIS — I130 Hypertensive heart and chronic kidney disease with heart failure and stage 1 through stage 4 chronic kidney disease, or unspecified chronic kidney disease: Principal | ICD-10-CM | POA: Diagnosis present

## 2024-05-11 DIAGNOSIS — N179 Acute kidney failure, unspecified: Secondary | ICD-10-CM | POA: Diagnosis present

## 2024-05-11 DIAGNOSIS — G4733 Obstructive sleep apnea (adult) (pediatric): Secondary | ICD-10-CM | POA: Diagnosis present

## 2024-05-11 DIAGNOSIS — I160 Hypertensive urgency: Secondary | ICD-10-CM | POA: Diagnosis present

## 2024-05-11 DIAGNOSIS — R0902 Hypoxemia: Secondary | ICD-10-CM | POA: Diagnosis present

## 2024-05-11 DIAGNOSIS — I272 Pulmonary hypertension, unspecified: Secondary | ICD-10-CM | POA: Diagnosis present

## 2024-05-11 DIAGNOSIS — D631 Anemia in chronic kidney disease: Secondary | ICD-10-CM | POA: Diagnosis present

## 2024-05-11 DIAGNOSIS — G4730 Sleep apnea, unspecified: Secondary | ICD-10-CM | POA: Diagnosis present

## 2024-05-11 DIAGNOSIS — Z880 Allergy status to penicillin: Secondary | ICD-10-CM

## 2024-05-11 DIAGNOSIS — I9589 Other hypotension: Secondary | ICD-10-CM | POA: Diagnosis present

## 2024-05-11 DIAGNOSIS — Z7189 Other specified counseling: Secondary | ICD-10-CM

## 2024-05-11 DIAGNOSIS — I4719 Other supraventricular tachycardia: Secondary | ICD-10-CM | POA: Diagnosis present

## 2024-05-11 DIAGNOSIS — Z7901 Long term (current) use of anticoagulants: Secondary | ICD-10-CM

## 2024-05-11 DIAGNOSIS — R9431 Abnormal electrocardiogram [ECG] [EKG]: Secondary | ICD-10-CM | POA: Diagnosis present

## 2024-05-11 DIAGNOSIS — Z888 Allergy status to other drugs, medicaments and biological substances status: Secondary | ICD-10-CM

## 2024-05-11 DIAGNOSIS — I77810 Thoracic aortic ectasia: Secondary | ICD-10-CM | POA: Diagnosis present

## 2024-05-11 DIAGNOSIS — Z86718 Personal history of other venous thrombosis and embolism: Secondary | ICD-10-CM

## 2024-05-11 DIAGNOSIS — R0989 Other specified symptoms and signs involving the circulatory and respiratory systems: Secondary | ICD-10-CM

## 2024-05-11 DIAGNOSIS — Z79899 Other long term (current) drug therapy: Secondary | ICD-10-CM

## 2024-05-11 LAB — CBC WITH DIFFERENTIAL/PLATELET
Abs Immature Granulocytes: 0.02 K/uL (ref 0.00–0.07)
Basophils Absolute: 0 K/uL (ref 0.0–0.1)
Basophils Relative: 0 %
Eosinophils Absolute: 0.2 K/uL (ref 0.0–0.5)
Eosinophils Relative: 2 %
HCT: 28.9 % — ABNORMAL LOW (ref 36.0–46.0)
Hemoglobin: 8.9 g/dL — ABNORMAL LOW (ref 12.0–15.0)
Immature Granulocytes: 0 %
Lymphocytes Relative: 21 %
Lymphs Abs: 1.5 K/uL (ref 0.7–4.0)
MCH: 25.9 pg — ABNORMAL LOW (ref 26.0–34.0)
MCHC: 30.8 g/dL (ref 30.0–36.0)
MCV: 84.3 fL (ref 80.0–100.0)
Monocytes Absolute: 0.6 K/uL (ref 0.1–1.0)
Monocytes Relative: 9 %
Neutro Abs: 4.8 K/uL (ref 1.7–7.7)
Neutrophils Relative %: 68 %
Platelets: 216 K/uL (ref 150–400)
RBC: 3.43 MIL/uL — ABNORMAL LOW (ref 3.87–5.11)
RDW: 14.7 % (ref 11.5–15.5)
WBC: 7.1 K/uL (ref 4.0–10.5)
nRBC: 0 % (ref 0.0–0.2)

## 2024-05-11 LAB — TROPONIN T, HIGH SENSITIVITY
Troponin T High Sensitivity: 43 ng/L — ABNORMAL HIGH (ref 0–19)
Troponin T High Sensitivity: 43 ng/L — ABNORMAL HIGH (ref 0–19)

## 2024-05-11 LAB — COMPREHENSIVE METABOLIC PANEL WITH GFR
ALT: 19 U/L (ref 0–44)
AST: 24 U/L (ref 15–41)
Albumin: 3.7 g/dL (ref 3.5–5.0)
Alkaline Phosphatase: 94 U/L (ref 38–126)
Anion gap: 11 (ref 5–15)
BUN: 20 mg/dL (ref 8–23)
CO2: 27 mmol/L (ref 22–32)
Calcium: 9.1 mg/dL (ref 8.9–10.3)
Chloride: 106 mmol/L (ref 98–111)
Creatinine, Ser: 1.42 mg/dL — ABNORMAL HIGH (ref 0.44–1.00)
GFR, Estimated: 41 mL/min — ABNORMAL LOW (ref >60)
Glucose, Bld: 127 mg/dL — ABNORMAL HIGH (ref 70–99)
Potassium: 3.5 mmol/L (ref 3.5–5.1)
Sodium: 144 mmol/L (ref 135–145)
Total Bilirubin: 0.4 mg/dL (ref 0.0–1.2)
Total Protein: 6.6 g/dL (ref 6.5–8.1)

## 2024-05-11 LAB — PRO BRAIN NATRIURETIC PEPTIDE: Pro Brain Natriuretic Peptide: 4971 pg/mL — ABNORMAL HIGH (ref <300.0)

## 2024-05-11 MED ORDER — IOHEXOL 350 MG/ML SOLN
80.0000 mL | Freq: Once | INTRAVENOUS | Status: AC | PRN
  Administered 2024-05-11: 80 mL via INTRAVENOUS

## 2024-05-11 MED ORDER — SODIUM CHLORIDE 0.9% FLUSH
3.0000 mL | Freq: Once | INTRAVENOUS | Status: DC
  Filled 2024-05-11: qty 3

## 2024-05-11 MED ORDER — FUROSEMIDE 10 MG/ML IJ SOLN
80.0000 mg | Freq: Once | INTRAMUSCULAR | Status: AC
  Administered 2024-05-11: 80 mg via INTRAVENOUS
  Filled 2024-05-11: qty 8

## 2024-05-11 NOTE — ED Notes (Signed)
 Pt transferred from WR to ED RM 5. Assuming pt care at this time.

## 2024-05-11 NOTE — ED Notes (Signed)
 Pt transported to CT at this time.

## 2024-05-11 NOTE — ED Provider Notes (Signed)
 [place] [facility] Provider Note   CSN: [id number] Arrival date & time: 05/11/24  2003     Patient presents with: Shortness of Breath   Joan [name] is a 68 y.o. female history of DVT on Eliquis , diastolic heart failure, here presenting with shortness of breath and leg swelling.  Patient states that for the last 2 weeks she has been having progressively worsening shortness of breath.  She states that she gets very short of breath with minimal ambulation.  Patient also noticed increasing leg swelling.  Patient has been taking her Lasix  40 mg daily for the last several days with no relief.  Patient is concerned that she may have a blood clot or worsening fluid in her lungs.  Denies any fevers or chills or cough.   The history is provided by the patient.       Prior to Admission medications   Medication Sig Start Date End Date Taking? Authorizing Provider  albuterol  (VENTOLIN  HFA) 108 (90 Base) MCG/ACT inhaler Inhale 1-2 puffs into the lungs every 6 (six) hours as needed for wheezing or shortness of breath. 06/30/22   Long, Fonda MATSU, MD  apixaban  (ELIQUIS ) 5 MG TABS tablet Take 1 tablet (5 mg total) by mouth 2 (two) times daily. 11/20/20   Gudena, Vinay, MD  ASCORBIC ACID PO Take 1 tablet by mouth daily.    [provider]  cephALEXin  (KEFLEX ) 500 MG capsule Take 1 capsule (500 mg total) by mouth 4 (four) times daily. 12/05/23   Mannie Pac T, DO  CYCLOBENZAPRINE HCL PO Take 1 tablet by mouth at bedtime as needed.    [provider]  doxycycline  (VIBRA -TABS) 100 MG tablet Take 1 tablet (100 mg total) by mouth 2 (two) times daily. 07/30/23   Neda Jennet LABOR, MD  ezetimibe -simvastatin  (VYTORIN ) 10-40 MG tablet Take 1 tablet by mouth daily. 04/27/20   Georgina Speaks, FNP  Fe Fum-FePoly-FA-Vit C-Vit B3 (INTEGRA F ) 125-1 MG CAPS Take 1 tablet by mouth daily. 12/07/20   Georgina Speaks, FNP  furosemide  (LASIX ) 40 MG tablet Take 1  tablet (40 mg total) by mouth daily for 7 days. 12/05/23 12/12/23  Mannie Pac T, DO  GEMTESA 75 MG TABS Take 1 tablet by mouth daily.    [provider]  guaiFENesin -dextromethorphan (ROBITUSSIN DM) 100-10 MG/5ML syrup Take 5 mLs by mouth every 6 (six) hours as needed for cough. 09/04/23   Cobb, Comer GAILS, NP  hydrALAZINE (APRESOLINE) 25 MG tablet Take by mouth.    [provider]  Insulin Pen Needle (PEN NEEDLES) 32G X 4 MM MISC 1 each by Does not apply route daily. Use as directed daily with saxenda  08/03/19   Georgina Speaks, FNP  lidocaine  (LIDODERM ) 5 % Place 1 patch onto the skin daily. Remove & Discard patch within 12 hours or as directed by MD 05/30/21   Nettie, April, MD  lisinopril -hydrochlorothiazide  (ZESTORETIC ) 20-25 MG tablet Take 1 tablet by mouth daily. 11/29/20   Georgina Speaks, FNP  methocarbamol  (ROBAXIN ) 500 MG tablet Take 1 tablet (500 mg total) by mouth 2 (two) times daily. 05/30/21   Palumbo, April, MD  methocarbamol  (ROBAXIN ) 750 MG tablet Take 750 mg by mouth 2 (two) times daily as needed for muscle spasms.    [provider]  Multiple Vitamins-Minerals (MULTIVITAMIN PO) Take 1 tablet by mouth daily.    [provider]  MYRBETRIQ 50 MG TB24 tablet Take 50 mg by mouth daily. 11/08/19   [provider]  omeprazole (PRILOSEC) 40 MG capsule Take 40 mg by mouth daily.    [provider]  solifenacin  (VESICARE ) 10 MG tablet Take 1 tablet by mouth once daily 08/05/19   Moore, [name], FNP  traMADol  (ULTRAM ) 50 MG tablet Take 1 tablet (50 mg total) by mouth every 6 (six) hours as needed. 11/20/20   Gudena, Vinay, MD  Vitamin D , Ergocalciferol , (DRISDOL ) 1.25 MG (50000 UNIT) CAPS capsule Take 1 capsule (50,000 Units total) by mouth every 7 (seven) days. 08/29/23   Georgina Speaks, FNP    Allergies: Lisinopril  and Penicillins    Review of Systems  Respiratory:  Positive for shortness of breath.   All other systems reviewed and are  negative.   Updated Vital Signs BP (!) 154/102 (BP Location: Right Arm)   Pulse 85   Temp 98.4 F (36.9 C) (Oral)   Resp (!) 30   Ht 5' 6 (1.676 m)   Wt (!) 179.2 kg   SpO2 99%   BMI 63.75 kg/m   Physical Exam Vitals and nursing note reviewed.  Constitutional:      Appearance: She is well-developed.  HENT:     Head: Normocephalic.     Mouth/Throat:     Mouth: Mucous membranes are moist.  Eyes:     Extraocular Movements: Extraocular movements intact.     Pupils: Pupils are equal, round, and reactive to light.  Cardiovascular:     Rate and Rhythm: Normal rate and regular rhythm.  Pulmonary:     Comments: Diminished bilaterally  Abdominal:     Palpations: Abdomen is soft.  Musculoskeletal:     Cervical back: Normal range of motion and neck supple.     Comments: Lymphedema chronically, 1+ edema   Skin:    General: Skin is warm.     Capillary Refill: Capillary refill takes less than 2 seconds.  Neurological:     General: No focal deficit present.     Mental Status: She is alert and oriented to person, place, and time.  Psychiatric:        Mood and Affect: Mood normal.        Behavior: Behavior normal.     (all labs ordered are listed, but only abnormal results are displayed) Labs Reviewed  CBC WITH DIFFERENTIAL/PLATELET - Abnormal; Notable for the following components:      Result Value   RBC 3.43 (*)    Hemoglobin 8.9 (*)    HCT 28.9 (*)    MCH 25.9 (*)    All other components within normal limits  COMPREHENSIVE METABOLIC PANEL WITH GFR - Abnormal; Notable for the following components:   Glucose, Bld 127 (*)    Creatinine, Ser 1.42 (*)    GFR, Estimated 41 (*)    All other components within normal limits  PRO BRAIN NATRIURETIC PEPTIDE - Abnormal; Notable for the following components:   Pro Brain Natriuretic Peptide 4,971.0 (*)    All other components within normal limits  TROPONIN T, HIGH SENSITIVITY - Abnormal; Notable for the following components:    Troponin T High Sensitivity 43 (*)    All other components within normal limits  CBC    EKG: EKG Interpretation Date/Time:  Tuesday May 11 2024 20:21:01 EDT Ventricular Rate:  87 PR Interval:  202 QRS Duration:  99 QT Interval:  447 QTC Calculation: 538 R Axis:   79  Text Interpretation: Sinus rhythm Nonspecific T abnormalities, lateral leads Prolonged QT interval QT more prolonged than previous Confirmed  by Patt Alm DEL [id number]) on 05/11/2024 8:22:47 PM  Radiology: DG Chest Port 1 View Result Date: 05/11/2024 EXAM: 1 VIEW XRAY OF THE CHEST 05/11/2024 08:38:00 PM COMPARISON: Chest x-ray 12/05/2023. CLINICAL HISTORY: SOB. Patient has had worsening SOB x 2 weeks. Is prescribed lasix  40mg  and taking it x 2 days with no improvement. Reports that her legs are swollen and tight. FINDINGS: LUNGS AND PLEURA: No focal pulmonary opacity. No pulmonary edema. No pleural effusion. No pneumothorax. HEART AND MEDIASTINUM: The heart is enlarged. BONES AND SOFT TISSUES: No acute osseous abnormality. IMPRESSION: 1. No acute findings. 2. Stable cardiomegaly. Electronically signed by: Greig Pique MD 05/11/2024 08:42 PM EDT RP Workstation: [id number]     Procedures   Medications Ordered in the ED  sodium chloride  flush (NS) 0.9 % injection 3 mL ( Intravenous Canceled Entry 05/11/24 2030)  furosemide  (LASIX ) injection 80 mg (has no administration in time range)                                    Medical Decision Making ELAISHA ZAHNISER is a 68 y.o. female here with shortness of breath.  Likely of heart failure exacerbation versus AKI versus PE.  Plan to get CBC CMP and BNP and chest x-ray and CTA chest.  11:06 PM Troponin is 43 and BNP elevated at 5000.  CTA showed no PE.  Patient will be admitted for heart failure exacerbation.  Given 80 mg of IV Lasix   Problems Addressed: Acute on chronic diastolic congestive heart failure (HCC): acute illness or injury  Amount and/or Complexity of Data  Reviewed Labs: ordered. Decision-making details documented in ED Course. Radiology: ordered and independent interpretation performed. Decision-making details documented in ED Course. ECG/medicine tests: ordered and independent interpretation performed. Decision-making details documented in ED Course.  Risk Prescription drug management.     Final diagnoses:  None    ED Discharge Orders     None          Patt Alm Macho, MD 05/11/24 2307

## 2024-05-11 NOTE — ED Triage Notes (Signed)
 Patient has had worsening SOB x 2 weeks.  Is prescribed lasix  40mg  and taking it x 2 days with no improvement. Reports that her legs are swollen and tight.

## 2024-05-12 ENCOUNTER — Observation Stay (HOSPITAL_COMMUNITY)

## 2024-05-12 DIAGNOSIS — I350 Nonrheumatic aortic (valve) stenosis: Secondary | ICD-10-CM | POA: Diagnosis present

## 2024-05-12 DIAGNOSIS — I5030 Unspecified diastolic (congestive) heart failure: Secondary | ICD-10-CM | POA: Diagnosis present

## 2024-05-12 DIAGNOSIS — R0902 Hypoxemia: Secondary | ICD-10-CM | POA: Diagnosis present

## 2024-05-12 DIAGNOSIS — Z7189 Other specified counseling: Secondary | ICD-10-CM | POA: Diagnosis not present

## 2024-05-12 DIAGNOSIS — J9601 Acute respiratory failure with hypoxia: Secondary | ICD-10-CM | POA: Diagnosis not present

## 2024-05-12 DIAGNOSIS — I5031 Acute diastolic (congestive) heart failure: Secondary | ICD-10-CM | POA: Diagnosis present

## 2024-05-12 DIAGNOSIS — N1831 Chronic kidney disease, stage 3a: Secondary | ICD-10-CM

## 2024-05-12 DIAGNOSIS — Z86718 Personal history of other venous thrombosis and embolism: Secondary | ICD-10-CM

## 2024-05-12 DIAGNOSIS — E113591 Type 2 diabetes mellitus with proliferative diabetic retinopathy without macular edema, right eye: Secondary | ICD-10-CM | POA: Diagnosis present

## 2024-05-12 DIAGNOSIS — N183 Chronic kidney disease, stage 3 unspecified: Secondary | ICD-10-CM | POA: Diagnosis present

## 2024-05-12 DIAGNOSIS — N1832 Chronic kidney disease, stage 3b: Secondary | ICD-10-CM

## 2024-05-12 DIAGNOSIS — I160 Hypertensive urgency: Secondary | ICD-10-CM | POA: Diagnosis present

## 2024-05-12 DIAGNOSIS — K219 Gastro-esophageal reflux disease without esophagitis: Secondary | ICD-10-CM | POA: Diagnosis present

## 2024-05-12 DIAGNOSIS — E782 Mixed hyperlipidemia: Secondary | ICD-10-CM

## 2024-05-12 DIAGNOSIS — I2489 Other forms of acute ischemic heart disease: Secondary | ICD-10-CM | POA: Diagnosis not present

## 2024-05-12 DIAGNOSIS — E1122 Type 2 diabetes mellitus with diabetic chronic kidney disease: Secondary | ICD-10-CM | POA: Diagnosis present

## 2024-05-12 DIAGNOSIS — I5043 Acute on chronic combined systolic (congestive) and diastolic (congestive) heart failure: Secondary | ICD-10-CM | POA: Diagnosis present

## 2024-05-12 DIAGNOSIS — Z86711 Personal history of pulmonary embolism: Secondary | ICD-10-CM | POA: Diagnosis not present

## 2024-05-12 DIAGNOSIS — Z6841 Body Mass Index (BMI) 40.0 and over, adult: Secondary | ICD-10-CM

## 2024-05-12 DIAGNOSIS — Z7901 Long term (current) use of anticoagulants: Secondary | ICD-10-CM

## 2024-05-12 DIAGNOSIS — R7989 Other specified abnormal findings of blood chemistry: Secondary | ICD-10-CM | POA: Diagnosis not present

## 2024-05-12 DIAGNOSIS — G4730 Sleep apnea, unspecified: Secondary | ICD-10-CM

## 2024-05-12 DIAGNOSIS — I130 Hypertensive heart and chronic kidney disease with heart failure and stage 1 through stage 4 chronic kidney disease, or unspecified chronic kidney disease: Secondary | ICD-10-CM | POA: Diagnosis present

## 2024-05-12 DIAGNOSIS — R000 Tachycardia, unspecified: Secondary | ICD-10-CM

## 2024-05-12 DIAGNOSIS — E119 Type 2 diabetes mellitus without complications: Secondary | ICD-10-CM

## 2024-05-12 DIAGNOSIS — E861 Hypovolemia: Secondary | ICD-10-CM | POA: Diagnosis not present

## 2024-05-12 DIAGNOSIS — Z7984 Long term (current) use of oral hypoglycemic drugs: Secondary | ICD-10-CM | POA: Diagnosis not present

## 2024-05-12 DIAGNOSIS — I10 Essential (primary) hypertension: Secondary | ICD-10-CM | POA: Diagnosis not present

## 2024-05-12 DIAGNOSIS — R0989 Other specified symptoms and signs involving the circulatory and respiratory systems: Secondary | ICD-10-CM | POA: Diagnosis not present

## 2024-05-12 DIAGNOSIS — I5033 Acute on chronic diastolic (congestive) heart failure: Secondary | ICD-10-CM

## 2024-05-12 DIAGNOSIS — I272 Pulmonary hypertension, unspecified: Secondary | ICD-10-CM | POA: Diagnosis present

## 2024-05-12 DIAGNOSIS — R9431 Abnormal electrocardiogram [ECG] [EKG]: Secondary | ICD-10-CM | POA: Diagnosis present

## 2024-05-12 DIAGNOSIS — Q2112 Patent foramen ovale: Secondary | ICD-10-CM | POA: Diagnosis not present

## 2024-05-12 DIAGNOSIS — Z7985 Long-term (current) use of injectable non-insulin antidiabetic drugs: Secondary | ICD-10-CM | POA: Diagnosis not present

## 2024-05-12 DIAGNOSIS — N179 Acute kidney failure, unspecified: Secondary | ICD-10-CM | POA: Diagnosis present

## 2024-05-12 DIAGNOSIS — E66813 Obesity, class 3: Secondary | ICD-10-CM | POA: Diagnosis present

## 2024-05-12 DIAGNOSIS — I4719 Other supraventricular tachycardia: Secondary | ICD-10-CM | POA: Diagnosis present

## 2024-05-12 DIAGNOSIS — I509 Heart failure, unspecified: Secondary | ICD-10-CM | POA: Diagnosis present

## 2024-05-12 DIAGNOSIS — I77810 Thoracic aortic ectasia: Secondary | ICD-10-CM | POA: Diagnosis present

## 2024-05-12 DIAGNOSIS — I441 Atrioventricular block, second degree: Secondary | ICD-10-CM | POA: Diagnosis present

## 2024-05-12 DIAGNOSIS — D631 Anemia in chronic kidney disease: Secondary | ICD-10-CM | POA: Diagnosis present

## 2024-05-12 DIAGNOSIS — Z79899 Other long term (current) drug therapy: Secondary | ICD-10-CM | POA: Diagnosis not present

## 2024-05-12 HISTORY — DX: Acute on chronic diastolic (congestive) heart failure: I50.33

## 2024-05-12 HISTORY — DX: Personal history of other venous thrombosis and embolism: Z86.718

## 2024-05-12 HISTORY — DX: Abnormal electrocardiogram (ECG) (EKG): R94.31

## 2024-05-12 HISTORY — DX: Long term (current) use of anticoagulants: Z79.01

## 2024-05-12 HISTORY — DX: Hypertensive urgency: I16.0

## 2024-05-12 HISTORY — DX: Chronic kidney disease, stage 3a: N18.31

## 2024-05-12 LAB — ECHOCARDIOGRAM COMPLETE
AR max vel: 1.88 cm2
AV Area VTI: 1.91 cm2
AV Area mean vel: 1.94 cm2
AV Mean grad: 12 mmHg
AV Peak grad: 22.3 mmHg
Ao pk vel: 2.36 m/s
Area-P 1/2: 3.95 cm2
Calc EF: 47.5 %
Height: 66 in
MV VTI: 1.78 cm2
S' Lateral: 4.2 cm
Single Plane A2C EF: 44.7 %
Single Plane A4C EF: 48.4 %
Weight: 6201.1 [oz_av]

## 2024-05-12 LAB — BASIC METABOLIC PANEL WITH GFR
Anion gap: 16 — ABNORMAL HIGH (ref 5–15)
BUN: 16 mg/dL (ref 8–23)
CO2: 26 mmol/L (ref 22–32)
Calcium: 8.5 mg/dL — ABNORMAL LOW (ref 8.9–10.3)
Chloride: 100 mmol/L (ref 98–111)
Creatinine, Ser: 1.27 mg/dL — ABNORMAL HIGH (ref 0.44–1.00)
GFR, Estimated: 47 mL/min — ABNORMAL LOW (ref >60)
Glucose, Bld: 96 mg/dL (ref 70–99)
Potassium: 3.5 mmol/L (ref 3.5–5.1)
Sodium: 142 mmol/L (ref 135–145)

## 2024-05-12 LAB — HIV ANTIBODY (ROUTINE TESTING W REFLEX): HIV Screen 4th Generation wRfx: NONREACTIVE

## 2024-05-12 MED ORDER — CARVEDILOL 6.25 MG PO TABS: 6.2500 mg | ORAL_TABLET | Freq: Two times a day (BID) | ORAL | Status: DC

## 2024-05-12 MED ORDER — METOPROLOL SUCCINATE ER 25 MG PO TB24
25.0000 mg | ORAL_TABLET | Freq: Every day | ORAL | Status: DC
  Administered 2024-05-12: 25 mg via ORAL
  Filled 2024-05-12: qty 1

## 2024-05-12 MED ORDER — PANTOPRAZOLE SODIUM 40 MG PO TBEC
40.0000 mg | DELAYED_RELEASE_TABLET | Freq: Every day | ORAL | Status: DC
  Administered 2024-05-13 – 2024-05-21 (×9): 40 mg via ORAL
  Filled 2024-05-12 (×9): qty 1

## 2024-05-12 MED ORDER — SPIRONOLACTONE 25 MG PO TABS
25.0000 mg | ORAL_TABLET | Freq: Every day | ORAL | Status: DC
  Administered 2024-05-12 – 2024-05-18 (×7): 25 mg via ORAL
  Filled 2024-05-12 (×7): qty 1

## 2024-05-12 MED ORDER — ACETAMINOPHEN 325 MG PO TABS
650.0000 mg | ORAL_TABLET | Freq: Four times a day (QID) | ORAL | Status: DC | PRN
Start: 2024-05-12 — End: 2024-05-21

## 2024-05-12 MED ORDER — HYDRALAZINE HCL 25 MG PO TABS
25.0000 mg | ORAL_TABLET | Freq: Three times a day (TID) | ORAL | Status: DC
  Administered 2024-05-12: 25 mg via ORAL
  Filled 2024-05-12: qty 1

## 2024-05-12 MED ORDER — POTASSIUM CHLORIDE CRYS ER 20 MEQ PO TBCR
40.0000 meq | EXTENDED_RELEASE_TABLET | Freq: Two times a day (BID) | ORAL | Status: DC
  Administered 2024-05-12 – 2024-05-17 (×10): 40 meq via ORAL
  Filled 2024-05-12 (×10): qty 2

## 2024-05-12 MED ORDER — ALBUTEROL SULFATE (2.5 MG/3ML) 0.083% IN NEBU: 2.5000 mg | INHALATION_SOLUTION | Freq: Four times a day (QID) | RESPIRATORY_TRACT | Status: DC | PRN

## 2024-05-12 MED ORDER — ACETAMINOPHEN 650 MG RE SUPP: 650.0000 mg | Freq: Four times a day (QID) | RECTAL | Status: DC | PRN

## 2024-05-12 MED ORDER — TRIMETHOBENZAMIDE HCL 100 MG/ML IM SOLN
200.0000 mg | Freq: Four times a day (QID) | INTRAMUSCULAR | Status: DC | PRN
  Administered 2024-05-13: 200 mg via INTRAMUSCULAR
  Filled 2024-05-12 (×3): qty 2

## 2024-05-12 MED ORDER — ENOXAPARIN SODIUM 40 MG/0.4ML IJ SOSY: 40.0000 mg | PREFILLED_SYRINGE | INTRAMUSCULAR | Status: DC

## 2024-05-12 MED ORDER — FUROSEMIDE 10 MG/ML IJ SOLN
40.0000 mg | Freq: Two times a day (BID) | INTRAMUSCULAR | Status: DC
Start: 2024-05-12 — End: 2024-05-12
  Administered 2024-05-12: 40 mg via INTRAVENOUS
  Filled 2024-05-12: qty 4

## 2024-05-12 MED ORDER — SIMVASTATIN 20 MG PO TABS
40.0000 mg | ORAL_TABLET | Freq: Every day | ORAL | Status: DC
  Administered 2024-05-12 – 2024-05-20 (×9): 40 mg via ORAL
  Filled 2024-05-12 (×9): qty 2

## 2024-05-12 MED ORDER — CARVEDILOL 6.25 MG PO TABS
6.2500 mg | ORAL_TABLET | Freq: Two times a day (BID) | ORAL | Status: DC
  Administered 2024-05-13: 6.25 mg via ORAL
  Filled 2024-05-12: qty 1

## 2024-05-12 MED ORDER — APIXABAN 5 MG PO TABS
5.0000 mg | ORAL_TABLET | Freq: Two times a day (BID) | ORAL | Status: DC
  Administered 2024-05-12 – 2024-05-21 (×18): 5 mg via ORAL
  Filled 2024-05-12 (×18): qty 1

## 2024-05-12 MED ORDER — FUROSEMIDE 10 MG/ML IJ SOLN
80.0000 mg | Freq: Two times a day (BID) | INTRAMUSCULAR | Status: DC
  Administered 2024-05-12 – 2024-05-16 (×8): 80 mg via INTRAVENOUS
  Filled 2024-05-12 (×8): qty 8

## 2024-05-12 MED ORDER — EMPAGLIFLOZIN 10 MG PO TABS
10.0000 mg | ORAL_TABLET | Freq: Every day | ORAL | Status: DC
Start: 2024-05-13 — End: 2024-05-18
  Administered 2024-05-13 – 2024-05-18 (×6): 10 mg via ORAL
  Filled 2024-05-12 (×6): qty 1

## 2024-05-12 MED ORDER — EZETIMIBE-SIMVASTATIN 10-40 MG PO TABS: 1.0000 | ORAL_TABLET | Freq: Every day | ORAL | Status: DC

## 2024-05-12 MED ORDER — LOSARTAN POTASSIUM 50 MG PO TABS
100.0000 mg | ORAL_TABLET | Freq: Every day | ORAL | Status: DC
  Administered 2024-05-12 – 2024-05-15 (×4): 100 mg via ORAL
  Filled 2024-05-12 (×6): qty 2

## 2024-05-12 MED ORDER — SODIUM CHLORIDE 0.9% FLUSH
3.0000 mL | Freq: Two times a day (BID) | INTRAVENOUS | Status: DC
  Administered 2024-05-12 – 2024-05-21 (×17): 3 mL via INTRAVENOUS

## 2024-05-12 MED ORDER — ALUM & MAG HYDROXIDE-SIMETH 200-200-20 MG/5ML PO SUSP
30.0000 mL | Freq: Four times a day (QID) | ORAL | Status: DC | PRN
  Administered 2024-05-12 – 2024-05-13 (×3): 30 mL via ORAL
  Filled 2024-05-12 (×3): qty 30

## 2024-05-12 MED ORDER — EZETIMIBE 10 MG PO TABS
10.0000 mg | ORAL_TABLET | Freq: Every day | ORAL | Status: DC
  Administered 2024-05-12 – 2024-05-20 (×9): 10 mg via ORAL
  Filled 2024-05-12 (×9): qty 1

## 2024-05-12 MED ORDER — OMEPRAZOLE 20 MG PO TBDD: 40.0000 mg | DELAYED_RELEASE_TABLET | Freq: Every day | ORAL | Status: DC

## 2024-05-12 NOTE — ED Notes (Signed)
 Unable to measure urine output due to purewick not in the correct position. Pt cleaned and linen changed.

## 2024-05-12 NOTE — Consult Note (Signed)
 Cardiology Consultation   Patient ID: Joan [name] MRN: [id number]; DOB: 1955-07-19  Admit date: 05/11/2024 Date of Consult: 05/12/2024  PCP:  Freddrick No   [facility] Providers Cardiologist:  Jennifer JONELLE Crape, MD   Patient Profile: Joan [name] is a 68 y.o. 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 Claudene.  History of Present Illness: Joan [name] has no significant cardiac history.  Joan [name] has echocardiogram earlier this year in May 2025 with preserved biventricular function with mild aortic stenosis.  Last seen 10/2023 by cardiology without any acute complaints.  Encouraged to have 69-month follow-up.  Currently patient being evaluated for shortness of breath going on for the past 2 to 3 weeks.  proBNP almost 5000, troponin 43-43.  CTA with no PE.  Had a large main pulmonary artery 3.3 cm consistent with pulmonary hypertension.  Given 1 dose of IV Lasix  80 mg and then resumed on 40 twice daily.  Echocardiogram pending.    Joan [name] is accompanied with her daughter today.  Reports that Joan [name] has been compliant with her Lasix  and was having worsening shortness of breath and peripheral edema.  Joan [name] has been trying to lose weight with Mounjaro so even though her weight has been stable Joan [name] feels like Joan [name] has been gaining some back.  However, Joan [name] does admit to missing doses of her Eliquis  sometimes and just forgets to take this.  Otherwise without any other acute concerns.  Joan [name] is laying flat in bed that any significant shortness of breath.  Joan [name] feels that urinary output on 80 of Lasix  was much better than the 40.  Potassium 3.5.SABRA  Creatinine 1.27.  Hemoglobin 8.9.  Past Medical History:  Diagnosis Date   Abnormal glucose 05/26/2019   Acid reflux    Acute bronchitis 08/05/2022   Acute pulmonary embolism (HCC) 01/21/2020    Age-related nuclear cataract of both eyes 03/07/2017   Anemia 08/05/2022   Antalgic gait 04/04/2023   Breast lump on left side at 4 o'clock position 06/28/2018   CAP (community acquired pneumonia) 08/05/2022   Chronic pain of both knees 11/02/2018   Class 3 severe obesity without serious comorbidity with body mass index (BMI) of 50.0 to 59.9 in adult 01/26/2020   Complete tear of right rotator cuff 11/12/2016   Cough 01/26/2020   Diabetes mellitus type 2 without retinopathy (HCC) 03/07/2017   Diabetes mellitus without complication (HCC)    Pt states Joan [name] is prediabetic   DVT of deep femoral vein, left (HCC) 01/26/2020   Essential hypertension 06/28/2018   Fall from slipping on ice 07/26/2019   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   Functional urinary incontinence 06/28/2018   Hypertension    Mixed hyperlipidemia 05/26/2019   Myopia with astigmatism and presbyopia, bilateral 03/07/2017   OSA (obstructive sleep apnea)    Primary localized osteoarthrosis of the knee, right 11/12/2016   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   Sleep apnea 10/10/2009   PSG 5/11 >> mild OSA, predominant RERAs, RDI 13/h     IMO SNOMED Dx Update Oct 2024     Urinary incontinence 11/10/2023    Past Surgical History:  Procedure Laterality Date   ABDOMINAL HYSTERECTOMY     TONSILECTOMY, ADENOIDECTOMY, BILATERAL MYRINGOTOMY AND TUBES       Scheduled Meds:  apixaban   5 mg Oral BID   furosemide   80 mg Intravenous  BID   hydrALAZINE  25 mg Oral Q8H   losartan  100 mg Oral Daily   metoprolol succinate  25 mg Oral Daily   [START ON 05/13/2024] pantoprazole   40 mg Oral Daily   sodium chloride  flush  3 mL Intravenous Once   sodium chloride  flush  3 mL Intravenous Q12H   spironolactone  25 mg Oral Daily   Continuous Infusions:  PRN Meds: acetaminophen **OR** acetaminophen, albuterol   Allergies:    Allergies  Allergen Reactions   Lisinopril  Cough   Penicillins Other (See Comments)     REACTION: hives    Social History:   Social History   Socioeconomic History   Marital status: Single    Spouse name: Not on file   Number of children: Not on file   Years of education: Not on file   Highest education level: Not on file  Occupational History   Occupation: CNA  Tobacco Use   Smoking status: Never   Smokeless tobacco: Never  Substance and Sexual Activity   Alcohol use: No   Drug use: No   Sexual activity: Not on file  Other Topics Concern   Not on file  Social History Narrative   ** Merged History Encounter **       Social Drivers of Health   Financial Resource Strain: Low Risk  (10/02/2022)   Received from [facility]   Overall Financial Resource Strain (CARDIA)    Difficulty of Paying Living Expenses: Not hard at all  Food Insecurity: No Food Insecurity (11/27/2022)   Received from [facility]   Hunger Vital Sign    Within the past 12 months, you worried that your food would run out before you got the money to buy more.: Never true    Within the past 12 months, the food you bought just didn't last and you didn't have money to get more.: Never true  Transportation Needs: No Transportation Needs (11/27/2022)   Received from [facility]   PRAPARE - Transportation    Lack of Transportation (Medical): No    Lack of Transportation (Non-Medical): No  Physical Activity: Not on file  Stress: Not on file  Social Connections: Not on file  Intimate Partner Violence: Not At Risk (11/27/2022)   Received from [facility]   Humiliation, Afraid, Rape, and Kick questionnaire    Within the last year, have you been afraid of your partner or ex-partner?: No    Within the last year, have you been humiliated or emotionally abused in other ways by your partner or ex-partner?: No    Within the last year, have you been kicked, hit, slapped, or otherwise physically hurt by your partner or ex-partner?: No    Within the last year, have you been raped or forced to  have any kind of sexual activity by your partner or ex-partner?: No    Family History:   Family History  Problem Relation Age of Onset   Heart disease Mother    Heart disease Father    Prostate cancer Father      ROS:  Please see the history of present illness.  All other ROS reviewed and negative.     Physical Exam/Data: Vitals:   05/12/24 1000 05/12/24 1110 05/12/24 1230 05/12/24 1451  BP: (!) 170/102 (!) 153/96 (!) 155/92 (!) 156/106  Pulse: 82 79  85  Resp: (!) 26 19 20 17   Temp:  98.3 F (36.8 C) 98 F (36.7 C)   TempSrc:  Oral Oral   SpO2: 97% 100%  100%  Weight:  (!) 175.8 kg    Height:  5' 6 (1.676 m)      Intake/Output Summary (Last 24 hours) at 05/12/2024 1525 Last data filed at 05/12/2024 1354 Gross per 24 hour  Intake 0 ml  Output 1850 ml  Net -1850 ml      05/12/2024   11:10 AM 05/11/2024    8:20 PM 11/12/2023    3:20 PM  Last 3 Weights  Weight (lbs) 387 lb 9.1 oz 395 lb 386 lb 12.8 oz  Weight (kg) 175.8 kg 179.171 kg 175.451 kg     Body mass index is 62.56 kg/m.  General:  Well nourished, well developed, in no acute distress HEENT: normal Neck: diffcult to asssess JVD Vascular: No carotid bruits; Distal pulses 2+ bilaterally Cardiac:  normal S1, S2; RRR; no murmur  Lungs:  clear to auscultation bilaterally, no wheezing, rhonchi or rales  Abd: soft, nontender, no hepatomegaly  Ext: mild non pitting edema Musculoskeletal:  No deformities, BUE and BLE strength normal and equal Skin: warm and dry  Neuro:  CNs 2-12 intact, no focal abnormalities noted Psych:  Normal affect   EKG:  The EKG was personally reviewed and demonstrates: Sinus rhythm, heart rate 87.  First-degree AV block PR 202.  QTc 538 Telemetry:  Telemetry was personally reviewed and demonstrates: Sinus rhythm.  Paroxysmal SVT heart rate 130s while tachycardic.  Relevant CV Studies: Echocardiogram 12/11/2023 1. Left ventricular ejection fraction, by estimation, is 60 to 65%. The   left ventricle has normal function. The left ventricle has no regional  wall motion abnormalities. Left ventricular diastolic parameters are  consistent with Grade I diastolic  dysfunction (impaired relaxation).   2. Right ventricular systolic function is normal. The right ventricular  size is normal.   3. The mitral valve is normal in structure. No evidence of mitral valve  regurgitation. No evidence of mitral stenosis.   4. The aortic valve was not well visualized. Aortic valve regurgitation  is not visualized. Mild aortic valve stenosis.   5. The inferior vena cava is normal in size with greater than 50%  respiratory variability, suggesting right atrial pressure of 3 mmHg.   Comparison(s): Echocardiogram done 12/21/21 showed an EF of 50-55% with mild  AS and an AV Mean Grad of 12 mmHg.   Laboratory Data: High Sensitivity Troponin:  No results for input(s): TROPONINIHS in the last 720 hours.   Chemistry Recent Labs  Lab 05/11/24 2026 05/12/24 1317  NA 144 142  K 3.5 3.5  CL 106 100  CO2 27 26  GLUCOSE 127* 96  BUN 20 16  CREATININE 1.42* 1.27*  CALCIUM 9.1 8.5*  GFRNONAA 41* 47*  ANIONGAP 11 16*    Recent Labs  Lab 05/11/24 2026  PROT 6.6  ALBUMIN 3.7  AST 24  ALT 19  ALKPHOS 94  BILITOT 0.4   Lipids No results for input(s): CHOL, TRIG, HDL, LABVLDL, LDLCALC, CHOLHDL in the last 168 hours.  Hematology Recent Labs  Lab 05/11/24 2026  WBC 7.1  RBC 3.43*  HGB 8.9*  HCT 28.9*  MCV 84.3  MCH 25.9*  MCHC 30.8  RDW 14.7  PLT 216   Thyroid No results for input(s): TSH, FREET4 in the last 168 hours.  BNP Recent Labs  Lab 05/11/24 2026  PROBNP 4,971.0*    DDimer No results for input(s): DDIMER in the last 168 hours.  Radiology/Studies:  CT Angio Chest PE W  and/or Wo Contrast Result Date: 05/11/2024 EXAM: CTA of the Chest with contrast for PE 05/11/2024 09:33:34 PM TECHNIQUE: CTA of the chest was performed without and with the  administration of 80 mL of iohexol  (OMNIPAQUE ) 350 MG/ML intravenous contrast. Multiplanar reformatted images are provided for review. MIP images are provided for review. Automated exposure control, iterative reconstruction, and/or weight based adjustment of the mA/kV was utilized to reduce the radiation dose to as low as reasonably achievable. COMPARISON: None available. CLINICAL HISTORY: Pulmonary embolism (PE) suspected, high prob. 68 y/o female with worsening SOB x 2 weeks. Patient states her legs are swollen and tight. FINDINGS: PULMONARY ARTERIES: Pulmonary arteries are adequately opacified for evaluation. No pulmonary embolism. The main pulmonary artery is enlarged, measuring up to 3.3 cm. MEDIASTINUM: Enlarged heart size. Aortic valve leaflet calcification. Mild atherosclerotic plaque. There is no acute abnormality of the thoracic aorta. LYMPH NODES: No mediastinal, hilar or axillary lymphadenopathy. LUNGS AND PLEURA: Nonspecific mosaic attenuation of the lungs. No focal consolidation or pulmonary edema. No pleural effusion or pneumothorax. UPPER ABDOMEN: Limited images of the upper abdomen are unremarkable. SOFT TISSUES AND BONES: Degenerative changes of the spine. No acute soft tissue abnormality. IMPRESSION: 1. No pulmonary embolism. 2. Enlarged main pulmonary artery measuring 3.3 cm, correlate for pulmonary hypertension. 3. Cardiomegaly. Electronically signed by: Morgane Naveau MD 05/11/2024 09:48 PM EDT RP Workstation: [id number]   DG Chest Port 1 View Result Date: 05/11/2024 EXAM: 1 VIEW XRAY OF THE CHEST 05/11/2024 08:38:00 PM COMPARISON: Chest x-ray 12/05/2023. CLINICAL HISTORY: SOB. Patient has had worsening SOB x 2 weeks. Is prescribed lasix  40mg  and taking it x 2 days with no improvement. Reports that her legs are swollen and tight. FINDINGS: LUNGS AND PLEURA: No focal pulmonary opacity. No pulmonary edema. No pleural effusion. No pneumothorax. HEART AND MEDIASTINUM: The heart is enlarged.  BONES AND SOFT TISSUES: No acute osseous abnormality. IMPRESSION: 1. No acute findings. 2. Stable cardiomegaly. Electronically signed by: Greig Pique MD 05/11/2024 08:42 PM EDT RP Workstation: [id number]     Assessment and Plan:  Acute on chronic HFpEF Reporting worsening shortness of breath/peripheral edema going on for the last 2 to 3 weeks.  Looks mildly volume up although body habitus significantly difficult to assess accurately.  Reported better urinary output on 80 of Lasix .  -1 L in the last 24 hours.   Increase IV Lasix  40 mg twice daily to 80 mg twice daily.   Start spironolactone 25 mg.  Would avoid SGLT2 inhibitor due to risk of infection. Restart losartan 100 mg.  Stop hydralazine for now, Joan [name] was not taking this previously and gives further room for uptitration better GDMT. Echocardiogram pending.  Likely preserved.  PSVT Asymptomatic, intermittent bouts of this with heart rates 130.  NO afib noted however Joan [name] is high risk for having it but already anticoagulated. Will start carvedilol 6.25 mg twice daily.  Continue to titrate up as needed.  Already got a dose of Toprol-XL so we will try this tomorrow.  Aortic stenosis  Mild on echocardiogram 11/2023.  Echocardiogram pending  Prolonged QTc Noted to be 538 on admission.  Will repeat EKG today and assess.  Avoid QTc prolonging agents and maintain electrolyte balance.  Possible pulmonary hypertension OSA CTA noting enlarged main pulmonary artery.  Will check echocardiogram but suspect this is likely related to intermittent compliance with CPAP (waiting on equipment) and obesity/hypoventilation syndrome.  Hypertension Uncontrolled.  Continue to titrate GDMT as above.  History of DVT/PE On Eliquis  5 mg.  Risk Assessment/Risk Scores: [place]  Heart Association (NYHA) Functional Class NYHA Class II   For questions or updates, please contact [facility] Please consult [email or web address] for contact info under       Signed, Thom LITTIE Sluder, PA-C  05/12/2024 3:25 PM

## 2024-05-12 NOTE — H&P (Addendum)
 History and Physical    Patient: Joan [name] FMW:[id number] DOB: 01/22/1956 DOA: 05/11/2024 DOS: the patient was seen and examined on 05/12/2024 PCP: Pcp, No  Patient coming from: Transferred from [facility]   Chief Complaint:  Chief Complaint  Patient presents with   Shortness of Breath   HPI: Joan [name] is a 68 y.o. female with medical history significant of hypertension, diastolic congestive heart failure, diabetes mellitus type 2, history of DVT, morbid obesity, OSA presents with shortness of breath and fluid buildup. She is accompanied by her daughter, Fronie.  She has been experiencing shortness of breath for the past three weeks. Last night, she had mild chest pain located in the upper chest area, which she thinks might be related to reflux. She is not on oxygen therapy.  She has been dealing with fluid retention that has persisted despite taking 40 mg of Lasix  for seven days. She describes issues with her lymphatic system, noting that the fluid is not moving.  She is not currently on oxygen but has been using a CPAP machine, which broke back in February. Despite efforts, she has not received a replacement CPAP machine.  She receives Jelsyn three shots every six months for an unspecified condition, as she does not take cortisone.   In the emergency department patient was noted to be afebrile with pulse 73-1 41, respirations 17-39, blood pressures elevated up to 179/115, and O2 saturations currently maintained on 4 L nasal cannula oxygen.  Labs significant for BNP 4971, high-sensitivity troponins 43 ->43, hemoglobin 8.9, BUN 20, and creatinine 1.42.  Chest x-ray showed no acute abnormality CT angiogram of the chest noted no pulmonary embolism with enlarged main pulmonary artery measuring 3.3 cm and cardiomegaly.  Patient had been given Lasix  80 mg IV x 1 dose.   Review of Systems: As mentioned in the history of present illness. All other systems reviewed and  are negative. Past Medical History:  Diagnosis Date   Abnormal glucose 05/26/2019   Acid reflux    Acute bronchitis 08/05/2022   Acute pulmonary embolism (HCC) 01/21/2020   Age-related nuclear cataract of both eyes 03/07/2017   Anemia 08/05/2022   Antalgic gait 04/04/2023   Breast lump on left side at 4 o'clock position 06/28/2018   CAP (community acquired pneumonia) 08/05/2022   Chronic pain of both knees 11/02/2018   Class 3 severe obesity without serious comorbidity with body mass index (BMI) of 50.0 to 59.9 in adult 01/26/2020   Complete tear of right rotator cuff 11/12/2016   Cough 01/26/2020   Diabetes mellitus type 2 without retinopathy (HCC) 03/07/2017   Diabetes mellitus without complication (HCC)    Pt states she is prediabetic   DVT of deep femoral vein, left (HCC) 01/26/2020   Essential hypertension 06/28/2018   Fall from slipping on ice 07/26/2019   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   Functional urinary incontinence 06/28/2018   Hypertension    Mixed hyperlipidemia 05/26/2019   Myopia with astigmatism and presbyopia, bilateral 03/07/2017   OSA (obstructive sleep apnea)    Primary localized osteoarthrosis of the knee, right 11/12/2016   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 She has never used smokeless tobacco. She reports that she does not drink alcohol and does not use drugs.  Allergies  Allergen Reactions   Lisinopril  Cough   Penicillins Other (See Comments)    REACTION: hives    Family History  Problem Relation Age of Onset   Heart disease Mother    Heart disease Father    Prostate cancer  Father     Prior to Admission medications   Medication Sig Start Date End Date Taking? Authorizing Provider  empagliflozin (JARDIANCE) 10 MG TABS tablet Take 10 mg by mouth daily. 03/29/24  Yes [provider]  MOUNJARO 5 MG/0.5ML Pen Inject 5 mg into the skin once a week. 03/29/24  Yes [provider]  albuterol  (VENTOLIN  HFA) 108 (90 Base) MCG/ACT inhaler Inhale 1-2 puffs into the lungs every 6 (six) hours as needed for wheezing or shortness of breath. 06/30/22   Long, Fonda MATSU, MD  apixaban  (ELIQUIS ) 5 MG TABS tablet Take 1 tablet (5 mg total) by mouth 2 (two) times daily. 11/20/20   Gudena, Vinay, MD  ASCORBIC ACID PO Take 1 tablet by mouth daily.    [provider]  cephALEXin  (KEFLEX ) 500 MG capsule Take 1 capsule (500 mg total) by mouth 4 (four) times daily. 12/05/23   Mannie Pac T, DO  CYCLOBENZAPRINE HCL PO Take 1 tablet by mouth at bedtime as needed.    [provider]  doxycycline  (VIBRA -TABS) 100 MG tablet Take 1 tablet (100 mg total) by mouth 2 (two) times daily. 07/30/23   Neda Jennet LABOR, MD  ezetimibe -simvastatin  (VYTORIN ) 10-40 MG tablet Take 1 tablet by mouth daily. 04/27/20   Georgina Speaks, FNP  Fe Fum-FePoly-FA-Vit C-Vit B3 (INTEGRA F ) 125-1 MG CAPS Take 1 tablet by mouth daily. 12/07/20   Georgina Speaks, FNP  furosemide  (LASIX ) 40 MG tablet Take 1 tablet (40 mg total) by mouth daily for 7 days. 12/05/23 12/12/23  Mannie Pac T, DO  GEMTESA 75 MG TABS Take 1 tablet by mouth daily.    [provider]  guaiFENesin -dextromethorphan (ROBITUSSIN DM) 100-10 MG/5ML syrup Take 5 mLs by mouth every 6 (six) hours as needed for cough. 09/04/23   Cobb, Comer GAILS, NP  hydrALAZINE (APRESOLINE) 25 MG tablet Take by mouth.    [provider]  Insulin Pen Needle (PEN NEEDLES) 32G X 4 MM MISC 1 each by Does not apply route daily. Use as directed daily with saxenda  08/03/19   Georgina Speaks, FNP  lidocaine  (LIDODERM ) 5 % Place 1 patch onto  the skin daily. Remove & Discard patch within 12 hours or as directed by MD 05/30/21   Nettie, April, MD  lisinopril -hydrochlorothiazide  (ZESTORETIC ) 20-25 MG tablet Take 1 tablet by mouth daily. 11/29/20   Georgina Speaks, FNP  losartan-hydrochlorothiazide  (HYZAAR) 100-25 MG tablet Take 1 tablet by mouth daily.    [provider]  methocarbamol  (ROBAXIN ) 500 MG tablet Take 1 tablet (500 mg total) by mouth 2 (two) times daily. 05/30/21   Palumbo, April, MD  methocarbamol  (ROBAXIN ) 750 MG tablet Take 750 mg by mouth 2 (two) times daily as needed for muscle spasms.    [provider]  Multiple Vitamins-Minerals (MULTIVITAMIN PO) Take 1 tablet by mouth daily.    [provider]  MYRBETRIQ 50 MG TB24 tablet Take 50 mg by mouth daily. 11/08/19   [provider]  omeprazole (PRILOSEC) 40 MG capsule Take 40 mg by mouth daily.  [provider]  solifenacin  (VESICARE ) 10 MG tablet Take 1 tablet by mouth once daily 08/05/19   Moore, [name], FNP  traMADol  (ULTRAM ) 50 MG tablet Take 1 tablet (50 mg total) by mouth every 6 (six) hours as needed. 11/20/20   Gudena, Vinay, MD  Vitamin D , Ergocalciferol , (DRISDOL ) 1.25 MG (50000 UNIT) CAPS capsule Take 1 capsule (50,000 Units total) by mouth every 7 (seven) days. 08/29/23   Georgina Speaks, FNP    Physical Exam: Vitals:   05/12/24 0830 05/12/24 0939 05/12/24 1000 05/12/24 1110  BP: (!) 167/96  (!) 170/102 (!) 153/96  Pulse: 84  82 79  Resp: (!) 29  (!) 26 19  Temp:  98.1 F (36.7 C)  98.3 F (36.8 C)  TempSrc:  Oral  Oral  SpO2: 97%  97% 100%  Weight:    (!) 175.8 kg  Height:    5' 6 (1.676 m)  Constitutional: Morbidly obese elderly female currently in no acute distress  Eyes: PERRL, lids and conjunctivae normal ENMT: Mucous membranes are moist.  Fair dentition Neck: normal, supple.  Unable to assess for JVD Respiratory: Decreased overall aeration without significant wheezes or rhonchi appreciated.  O2 saturations  currently maintained on 4 L of nasal cannula oxygen. Cardiovascular: Regular rate and rhythm, no murmurs / rubs / gallops.  2+ pitting bilateral lower extremity edema.  Abdomen: Protuberant abdomen without tenderness to palpation.  Bowel sounds positive.  Musculoskeletal: no clubbing / cyanosis. No joint deformity upper and lower extremities. Good ROM, no contractures. Normal muscle tone.  Skin: no rashes, lesions, ulcers.   Neurologic: CN 2-12 grossly intact.  . Strength 5/5 in all 4.  Psychiatric: Normal judgment and insight. Alert and oriented x 3. Normal mood.   Data Reviewed:  EKG reveals sinus rhythm at 87 bpm with prolonged QT interval 538 reviewed labs, imaging, and pertinent records as documented.  Assessment and Plan:  Diastolic congestive heart failure Acute on chronic.  Patient presents with complaints of progressively worsening shortness of breath and lower extremity swelling.  Probe BNP elevated at 4971.  She orts using Lasix  40 mg p.o. daily recently without improvement in symptoms. 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  Medication fill history does not match, but patient still reports being on Eliquis  - Continue Eliquis   Prolonged QT interval QTc prolonged at 538. - Avoid QT prolonging medications - Correct any electrolyte abnormalities  Hypertensive urgency Initial blood pressures elevated up to 179/115. - Continue hydralazine - Add back additional blood pressure medications once med reconciliation completed - Hydralazine IV as needed for elevated blood pressure\  Chronic kidney disease stage IIIb Creatinine  noted to be 1.42 on admission.  Baseline creatinine previously noted to be around 1.2. - Continue to monitor with diuresis  Controlled diabetes mellitus type 2, without long-term use of insulin Last available hemoglobin A1c noted to be 5.3 when checked 7 months ago. - Continue heart healthy and carb modified diet as tolerated - Continue Jardiance  Hyperlipidemia - Continue ezetimibe -simvastatin   OSA Patient had not recently been on CPAP after it broke earlier this year and she has been trying to get a replacement, but has not been able  to yet. - Continue CPAP nightly  Morbid obesity BMI 62.56 kg/m.  Patient is on Mounjaro in outpatient setting. - Continue Mounjaro in the outpatient setting  GERD - Continue omeprazole  DVT prophylaxis: Eliquis  Advance Care Planning:   Code Status: Full Code    Consults: Cardiology  Family Communication: Daughter updated at bedside  Severity of Illness: The appropriate patient status for this patient is INPATIENT. Inpatient status is judged to be reasonable and necessary in order to provide the required intensity of service to ensure the patient's safety. The patient's presenting symptoms, physical exam findings, and initial radiographic and laboratory data in the context of their chronic comorbidities is felt to place them at high risk for further clinical deterioration. Furthermore, it is not anticipated that the patient will be medically stable for discharge from the hospital within 2 midnights of admission.   * I certify that at the point of admission it is my clinical judgment that the patient will require inpatient hospital care spanning beyond 2 midnights from the point of admission due to high intensity of service, high risk for further deterioration and high frequency of surveillance required.*  Author: Maximino DELENA Sharps, MD 05/12/2024 11:42 AM  For on call review [email or web address].

## 2024-05-12 NOTE — Plan of Care (Signed)
 Plan of Care Note for accepted transfer   Patient name: Joan [name] FMW:[id number] DOB: [birth year]  Facility requesting transfer: [facility] ED Requesting Provider: Dr. Geroldine Facility course: 68 year old female with history of type 2 diabetes, hypertension, hyperlipidemia, OSA, DVT/PE, CKD stage IIIb presenting with worsening shortness of breath x 2 weeks and bilateral lower extremity edema.  She has been taking Lasix  40 mg daily for the last few days with no relief.  Last echo done in May 2025 with preserved EF.  Oxygen saturation in the low 90s on arrival to the ED and placed on 2 L [place].  Labs done in the ED showing hemoglobin 8.9 (close to baseline), creatinine 1.4 (was 1.2 in May 2025), proBNP 4971, troponin 43>43.    CTA chest showing: IMPRESSION: 1. No pulmonary embolism. 2. Enlarged main pulmonary artery measuring 3.3 cm, correlate for pulmonary hypertension. 3. Cardiomegaly.  Patient was given IV Lasix  80 mg.  Plan of care: The patient is accepted for admission to Telemetry unit at [facility].  [facility] will assume care on arrival to accepting facility. Until arrival, care as per EDP. However, [facility] available 24/7 for questions and assistance.  Check [email or web address] for on-call coverage.  Nursing staff, please call [facility] Admits & Consults System-Wide number under Amion on patient's arrival so appropriate admitting provider can evaluate the pt.

## 2024-05-12 NOTE — ED Notes (Signed)
 Called to pt room to assist with rotating. Pt noted wanting to get off her back for a while, as she is becoming uncomfortable from the extended time.  Pt is able to move in bed independently.  Purewick placement confirmed at this time as well.

## 2024-05-12 NOTE — ED Notes (Signed)
 Pt notes that at this time, she is feeling much better.  She only notes restless in the bed due to length of time.  Pt was assisted to reposition again.

## 2024-05-12 NOTE — ED Notes (Signed)
 Arrived to room to round on pt. She denies any pain or discomfort or SHOB.  She is resting with her eyes closed on her back with the head elevated.  Rails x2.  Pt has a purewick in place working well and is independent otherwise.

## 2024-05-12 NOTE — Progress Notes (Signed)
  Echocardiogram 2D Echocardiogram has been performed.  Joan [name] 05/12/2024, 2:39 PM

## 2024-05-12 NOTE — Plan of Care (Signed)
  Problem: Education: Goal: Knowledge of General Education information will improve Description: Including pain rating scale, medication(s)/side effects and non-pharmacologic comfort measures Outcome: Progressing   Problem: Clinical Measurements: Goal: Ability to maintain clinical measurements within normal limits will improve Outcome: Progressing Goal: Diagnostic test results will improve Outcome: Progressing   Problem: Coping: Goal: Level of anxiety will decrease Outcome: Progressing

## 2024-05-13 ENCOUNTER — Encounter (HOSPITAL_COMMUNITY): Payer: Self-pay | Admitting: *Deleted

## 2024-05-13 DIAGNOSIS — I5031 Acute diastolic (congestive) heart failure: Secondary | ICD-10-CM

## 2024-05-13 DIAGNOSIS — I4719 Other supraventricular tachycardia: Secondary | ICD-10-CM

## 2024-05-13 DIAGNOSIS — N1831 Chronic kidney disease, stage 3a: Secondary | ICD-10-CM | POA: Diagnosis not present

## 2024-05-13 HISTORY — DX: Other supraventricular tachycardia: I47.19

## 2024-05-13 LAB — BASIC METABOLIC PANEL WITH GFR
Anion gap: 12 (ref 5–15)
BUN: 19 mg/dL (ref 8–23)
CO2: 29 mmol/L (ref 22–32)
Calcium: 8.6 mg/dL — ABNORMAL LOW (ref 8.9–10.3)
Chloride: 101 mmol/L (ref 98–111)
Creatinine, Ser: 1.6 mg/dL — ABNORMAL HIGH (ref 0.44–1.00)
GFR, Estimated: 35 mL/min — ABNORMAL LOW (ref >60)
Glucose, Bld: 111 mg/dL — ABNORMAL HIGH (ref 70–99)
Potassium: 4 mmol/L (ref 3.5–5.1)
Sodium: 142 mmol/L (ref 135–145)

## 2024-05-13 LAB — CBC
HCT: 30.9 % — ABNORMAL LOW (ref 36.0–46.0)
Hemoglobin: 9.2 g/dL — ABNORMAL LOW (ref 12.0–15.0)
MCH: 25.6 pg — ABNORMAL LOW (ref 26.0–34.0)
MCHC: 29.8 g/dL — ABNORMAL LOW (ref 30.0–36.0)
MCV: 86.1 fL (ref 80.0–100.0)
Platelets: 204 K/uL (ref 150–400)
RBC: 3.59 MIL/uL — ABNORMAL LOW (ref 3.87–5.11)
RDW: 14.7 % (ref 11.5–15.5)
WBC: 7.4 K/uL (ref 4.0–10.5)
nRBC: 0 % (ref 0.0–0.2)

## 2024-05-13 MED ORDER — METOPROLOL SUCCINATE ER 100 MG PO TB24
100.0000 mg | ORAL_TABLET | Freq: Every day | ORAL | Status: DC
  Administered 2024-05-13: 100 mg via ORAL
  Filled 2024-05-13: qty 1

## 2024-05-13 MED ORDER — HYDROCORTISONE 1 % EX LOTN
TOPICAL_LOTION | Freq: Two times a day (BID) | CUTANEOUS | Status: DC | PRN
  Filled 2024-05-13: qty 118

## 2024-05-13 MED ORDER — METOPROLOL SUCCINATE ER 100 MG PO TB24: 100.0000 mg | ORAL_TABLET | Freq: Every day | ORAL | Status: DC

## 2024-05-13 MED ORDER — HYDRALAZINE HCL 50 MG PO TABS
50.0000 mg | ORAL_TABLET | Freq: Three times a day (TID) | ORAL | Status: DC
  Administered 2024-05-13 – 2024-05-14 (×3): 50 mg via ORAL
  Filled 2024-05-13 (×3): qty 1

## 2024-05-13 MED ORDER — HYDROCORTISONE 1 % EX CREA
TOPICAL_CREAM | Freq: Two times a day (BID) | CUTANEOUS | Status: DC | PRN
  Filled 2024-05-13: qty 28

## 2024-05-13 NOTE — Progress Notes (Signed)
 PROGRESS NOTE Joan [name]  FMW:[id number] DOB: 08-Oct-1955 DOA: 05/11/2024 PCP: Pcp, No  Brief Narrative/Hospital Course: Joan [name] is a 68 y.o. female with PMH of hypertension, diastolic congestive heart failure, diabetes mellitus type 2, history of DVT, morbid obesity, OSA presents with shortness of breath x  3 weeks and on 10/28 night, she had mild chest pain located in the upper chest area, which she thinks might be related to reflux. She is not on oxygen therapy. She has been dealing with fluid retention that has persisted despite taking 40 mg of Lasix  for seven days.She describes issues with her lymphatic system, noting that the fluid is not moving. She is not currently on oxygen but has been using a CPAP machine, which broke back in February. Despite efforts, she has not received a replacement CPAP machine. She receives Jelsyn three shots every six months for an unspecified condition, as she does not take cortisone. In the ZI:jqzampoz with pulse 73-1 41, respirations 17-39, blood pressures elevated up to 179/115, and O2 saturations currently maintained on 4 L nasal cannula oxygen.  Labs significant for BNP 4971, high-sensitivity troponins 43 ->43, hemoglobin 8.9, BUN 20, and creatinine 1.42.  Chest x-ray showed no acute abnormality CT angiogram of the chest noted no pulmonary embolism with enlarged main pulmonary artery measuring 3.3 cm and cardiomegaly. 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 Echo shows EF 45-50% mildly decreased function no RWMA G1DD. Cardiology Input appreciated GDMT-Coreg 6.25 twice daily, losartan 100, Aldactone 25 along with potassium supplement.  Added hydralazine Continue IV diuresis. Wt on admission 295 lb and improving Cont to monitor daily I/O,weight, electrolytes and net balance as below.Keep on  salt/fluid restricted diet and monitor in tele. Net IO Since Admission: -4,525 mL [05/13/24 1203]  Filed Weights   05/11/24 2020 05/12/24 1110 05/13/24 0401  Weight: (!) 179.2 kg (!) 175.8 kg (!) 158.9 kg    Recent Labs  Lab 05/11/24 2026 05/12/24 1317 05/13/24 0238  PROBNP 4,971.0*  --   --   BUN 20 16 19   CREATININE 1.42* 1.27* 1.60*  K 3.5 3.5 4.0   Essential hypertension: BP stable, continue CHF meds as above  Elevated troponin: Rather flat subset demand ischemia due to CHF  Atrial tachycardia: Suspected, beta-blocker added per cardiology  Aki on CKDIIIb Creatinine noted to be 1.42 on admission.  Baseline creatinine previously noted to be around 1.2.  Creatinine trending up in the setting of diuresis monitor closely Recent Labs    12/05/23 2221 05/11/24 2026 05/12/24 1317 05/13/24 0238  BUN 31* 20 16 19   CREATININE 1.25* 1.42* 1.27* 1.60*  CO2 25 27 26 29   K 4.0 3.5 3.5 4.0   Normocytic anemia: Monitor hemoglobin baseline hemoglobin around 8 to 9 g, suspect in the setting of CKD,   History of DVT/PE on chronic anticoagulation since 01/2020: Continue Eliquis    Prolonged QT interval 538. Avoid QT prolonging medications. Correct any electrolyte abnormalities   T2DM without long-term use of insulin: Last  available hemoglobin A1c noted to be 5.3 when checked 7 months ago.Cont Jardiance   Hyperlipidemia Continue ezetimibe -simvastatin    OSA Patient had not recently been on CPAP after it broke earlier this year and she has been trying to get a replacement, but has not  been able to yet. Continue CPAP nightly   GERD Continue omeprazole  Morbid Obesity w/ Body mass index is 56.54 kg/m.: Will benefit with PCP follow-up, weight loss,healthy lifestyle and outpatient Mounjaro  Mobility: PT Orders: Active PT Follow up Rec:    DVT prophylaxis:  Code Status:   Code Status: Full Code Family Communication: plan of care discussed with patient at bedside. Patient status is: Remains hospitalized because of severity of illness Level of care: Telemetry   Dispo: The patient is from: home            Anticipated disposition: TBD Objective: Vitals last 24 hrs: Vitals:   05/13/24 0804 05/13/24 0844 05/13/24 1142 05/13/24 1153  BP:  (!) 156/92 (!) 144/67 (!) 144/67  Pulse:  83 78   Resp:  20 19   Temp: 98.6 F (37 C) 98.9 F (37.2 C) 98.6 F (37 C) 98.6 F (37 C)  TempSrc:  Oral Oral Oral  SpO2:  94% 97%   Weight:      Height:        Physical Examination: General exam: alert awake, oriented, older than stated age HEENT:Oral mucosa moist, Ear/Nose WNL grossly Respiratory system: Bilaterally diminished BS,no use of accessory muscle Cardiovascular system: S1 & S2 +, No JVD. Gastrointestinal system: Abdomen soft,NT,ND, BS+ Nervous System: Alert, awake, moving all extremities,and following commands. Extremities: extremities warm, leg edema ++ Skin: Warm, no rashes MSK: Normal muscle bulk,tone, power   Medications reviewed:  Scheduled Meds:  apixaban   5 mg Oral BID   empagliflozin  10 mg Oral Daily   ezetimibe   10 mg Oral QHS   And   simvastatin   40 mg Oral QHS   furosemide   80 mg Intravenous BID   hydrALAZINE  50 mg Oral Q8H   losartan  100 mg Oral Daily   metoprolol succinate  100 mg Oral Daily   pantoprazole   40 mg Oral Daily   potassium chloride   40 mEq Oral BID   sodium chloride  flush  3 mL Intravenous Once   sodium chloride  flush  3 mL Intravenous Q12H   spironolactone  25 mg Oral Daily   Continuous Infusions: Diet: Diet Order              Diet heart healthy/carb modified Room service appropriate? Yes; Fluid consistency: Thin  Diet effective now                    Data Reviewed: I have personally reviewed following labs and imaging studies ( see epic result tab) CBC: Recent Labs  Lab 05/11/24 2026 05/13/24 0238  WBC 7.1 7.4  NEUTROABS 4.8  --   HGB 8.9* 9.2*  HCT 28.9* 30.9*  MCV 84.3 86.1  PLT 216 204   CMP: Recent Labs  Lab 05/11/24 2026 05/12/24 1317 05/13/24 0238  NA 144 142 142  K 3.5 3.5 4.0  CL 106 100 101  CO2 27 26 29   GLUCOSE 127* 96 111*  BUN 20 16 19   CREATININE 1.42* 1.27* 1.60*  CALCIUM 9.1 8.5* 8.6*   GFR: Estimated Creatinine Clearance: 54.1 mL/min (A) (by C-G formula based on SCr of 1.6 mg/dL (H)). Recent Labs  Lab 05/11/24 2026  AST 24  ALT 19  ALKPHOS 94  BILITOT 0.4  PROT 6.6  ALBUMIN 3.7   No results for input(s): LIPASE, AMYLASE in the last 168 hours. No results for input(s): AMMONIA in the last 168 hours. Coagulation Profile: No results for input(s): INR, PROTIME in the last 168 hours. Unresulted Labs (From admission, onward)    None      Antimicrobials/Microbiology: Anti-infectives (From admission, onward)    None         Component Value Date/Time   SDES  12/05/2023 2229    URINE, RANDOM Performed at [facility], [address] [place], [place] [address]    [facility]  12/05/2023 2229    NONE Reflexed from [id number] Performed at [facility], [address] [place], [place] [address]    CULT >=100,000 COLONIES/mL ESCHERICHIA COLI (A) 12/05/2023 2229   REPTSTATUS 12/08/2023 FINAL 12/05/2023 2229    Procedures:    Mennie LAMY, MD [facility] 05/13/2024, 12:03 PM

## 2024-05-13 NOTE — Progress Notes (Signed)
 Heart Failure Nurse Navigator Progress Note  PCP: Pcp, No PCP-Cardiologist: Revankar Admission Diagnosis: None Admitted from: Home  Presentation:   Joan [name] presented with worsening shortness of breath x 2 weeks, Bilateral leg swelling. Patient has been taking her Lasix  40 mg daily for the last several days with no relief. BP 154/102, HR 85, BNP 4,971, BMI 56.54. Trop 43. CTA with no PE, Enlarged main pulmonary artery measuring 3.3 cm, correlate for pulmonary Hypertension, Cardiomegaly. Echo on 05/12/2024 showed reduced LVEF of 45 to 50%, no RWMA, mild concentric LVH, G1 DD, mildly reduced RV systolic function, moderately dilated left and right atrium, moderate mitral regurgitation, moderate MAC, elevated LVEDP, mild aortic stenosis with a mean gradient of 12 mmHg, and IVC with less than 50% respiratory variability indicating elevated right atrial pressure. The LVEF is down from prior echo on 11/2023 when it was 60 to 65%. Chest x-ray showed no acute abnormality.  Patient was educated on the sign and symptoms of heart failure, daily weights, when to call her doctor or go to the ED. Diet/ fluid restrictions, and taking all medications as prescribed, attending all medical appointments. Patient verbalized her understanding of all education. A HF TOC appointment was scheduled for 05/24/2024 @ 11 am.   ECHO/ LVEF: 45-50% Diastolic   Clinical Course:  Past Medical History:  Diagnosis Date   Abnormal glucose 05/26/2019   Acid reflux    Acute bronchitis 08/05/2022   Acute pulmonary embolism (HCC) 01/21/2020   Age-related nuclear cataract of both eyes 03/07/2017   Anemia 08/05/2022   Antalgic gait 04/04/2023   Breast lump on left side at 4 o'clock position 06/28/2018   CAP (community acquired pneumonia) 08/05/2022   Chronic pain of both knees 11/02/2018   Class 3 severe obesity without serious comorbidity with body mass index (BMI) of 50.0 to 59.9 in adult 01/26/2020   Complete tear of  right rotator cuff 11/12/2016   Cough 01/26/2020   Diabetes mellitus type 2 without retinopathy (HCC) 03/07/2017   Diabetes mellitus without complication (HCC)    Pt states she is prediabetic   DVT of deep femoral vein, left (HCC) 01/26/2020   Essential hypertension 06/28/2018   Fall from slipping on ice 07/26/2019   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   Functional urinary incontinence 06/28/2018   Hypertension    Mixed hyperlipidemia 05/26/2019   Myopia with astigmatism and presbyopia, bilateral 03/07/2017   OSA (obstructive sleep apnea)    Primary localized osteoarthrosis of the knee, right 11/12/2016   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   Sleep apnea 10/10/2009   PSG 5/11 >> mild OSA, predominant RERAs, RDI 13/h     IMO SNOMED Dx Update Oct 2024     Urinary incontinence 11/10/2023     Social History   Socioeconomic History   Marital status: Single    Spouse name: Not on file   Number of children: Not on file   Years of education: Not on file   Highest education level: Not on file  Occupational History   Occupation: CNA  Tobacco Use   Smoking status: Never   Smokeless tobacco: Never  Substance and Sexual Activity   Alcohol use: No   Drug use: No   Sexual activity: Not on file  Other Topics Concern   Not on file  Social History Narrative   ** Merged History Encounter **       Social Drivers of Health   Financial Resource Strain: Low Risk  (10/02/2022)   Received  [facility]   Overall Financial Resource Strain (CARDIA)    Difficulty of Paying Living Expenses: Not hard at all  Food Insecurity: No Food Insecurity (11/27/2022)   Received from [facility]   Hunger Vital Sign    Within the past 12 months, you worried that your food would run out before you got the money to buy more.: Never true    Within the past 12 months, the food you bought just didn't last and you didn't have money to get more.: Never true  Transportation Needs: No  Transportation Needs (11/27/2022)   Received from [facility]   PRAPARE - Transportation    Lack of Transportation (Medical): No    Lack of Transportation (Non-Medical): No  Physical Activity: Not on file  Stress: Not on file  Social Connections: Not on file   Education Assessment and Provision:  Detailed education and instructions provided on heart failure disease management including the following:  Signs and symptoms of Heart Failure When to call the physician Importance of daily weights Low sodium diet Fluid restriction Medication management Anticipated future follow-up appointments  Patient education given on each of the above topics.  Patient acknowledges understanding via teach back method and acceptance of all instructions.  Education Materials:  Living Better With Heart Failure Booklet, HF zone tool, & Daily Weight Tracker Tool.  Patient has scale at home: Yes Patient has pill box at home: will buy one.     High Risk Criteria for Readmission and/or Poor Patient Outcomes: Heart failure hospital admissions (last 6 months): 2  No Show rate: 13%  Difficult social situation: No Demonstrates medication adherence: Yes Primary Language: English  Literacy level: Reading, writing, and comprehension   Barriers of Care:   New decreased EF  Diet/ fluid restrictions  Daily weights Non compliance with CPAP  Considerations/Referrals:   Referral made to Heart Failure Pharmacist Stewardship: Yes Referral made to Heart Failure CSW/NCM TOC: NA Referral made to Heart & Vascular TOC clinic: Yes, 05/24/2024 @ 11 am.   Items for Follow-up on DC/TOC: Continued HF education Diet/ fluid restrictions/ daily weights   Stephane Haddock, BSN, RN Heart Failure Teacher, Adult Education Only

## 2024-05-13 NOTE — Plan of Care (Signed)

## 2024-05-13 NOTE — Hospital Course (Addendum)
 Joan [name] is a 68 y.o. female with PMH of hypertension, diastolic congestive heart failure, diabetes mellitus type 2, history of DVT, morbid obesity, OSA presents with shortness of breath x  3 weeks and on 10/28 night, she had mild chest pain located in the upper chest area, which she thinks might be related to reflux. She is not on oxygen therapy. She has been dealing with fluid retention that has persisted despite taking 40 mg of Lasix  for seven days.She describes issues with her lymphatic system, noting that the fluid is not moving. She is not currently on oxygen but has been using a CPAP machine, which broke back in February. Despite efforts, she has not received a replacement CPAP machine. She receives Jelsyn three shots every six months for an unspecified condition, as she does not take cortisone. In the ZI:jqzampoz with pulse 73-1 41, respirations 17-39, blood pressures elevated up to 179/115, and O2 saturations currently maintained on 4 L nasal cannula oxygen.  Labs significant for BNP 4971, high-sensitivity troponins 43 ->43, hemoglobin 8.9, BUN 20, and creatinine 1.42.  Chest x-ray showed no acute abnormality CT angiogram of the chest noted no pulmonary embolism with enlarged main pulmonary artery measuring 3.3 cm and cardiomegaly. Patient admitted cardiology consulted and managed with aggressive diuresis/GDMT> which was discontinued due to elevated creatinine and hypotension. Anticipate discharge pending creatinine improvement  Subjective: Seen and examined Overall feels much improved.  No new complaints  Overnight febrile BP stable labs with creatinine further downtrending 2.4> 2.12> 2.07 Weight 395/387 lb>362>363  She has been able to walk more over the past day and able to lay flat in the bed  Assessment and plan:  Acute on chronic combined mild systolic and diastolic congestive heart failure: Presenting with worsening shortness of breath, fluid overload and abnormal BNP  4971-CT negative for PE.TTE-EF 45-50% mildly decreased function no RWMA G1DD- Cardiology on board input appreciated  S/o IV diuresis,GDMT- w/ Toprol, losartan, Aldactone and hydralazine- but Lasix  and GDMT stopped due to soft bp/AKI> Cont Toprol 50 only. Given creatinine is improving cardiology deferred RHC, needs TTE 6-12 months to monitor aortic stenosis Difficult to assess volume status due to body habitus. Net netive balance Net IO Since Admission: -11,549 mL [05/20/24 1149] Per Epic- at her  visit with Dr. Edwyna 11/11/33, weight was 175.5 kg but had been 156.5 kg about a week prior Cont to monitor daily I/O,weight, electrolytes.Keep on  salt/fluid restricted diet and monitor in tele.  Once cleared by cardiology plan for discharge. Filed Weights   05/18/24 0353 05/19/24 0545 05/20/24 0509  Weight: (!) 164.6 kg (!) 164.3 kg (!) 164.9 kg    Recent Labs  Lab 05/16/24 0943 05/17/24 0650 05/18/24 0403 05/19/24 0513 05/20/24 0432  BUN 32* 35* 35* 36* 39*  CREATININE 2.33* 2.42* 2.43* 2.12* 2.07*  K 3.9 4.6 4.4 4.4 4.4   Intake/Output Summary (Last 24 hours) at 05/20/2024 1149 Last data filed at 05/20/2024 9185 Gross per 24 hour  Intake 840 ml  Output 1200 ml  Net -360 ml    SVT vs AT w/ episode of bradycardia during sleep Had PSVT- converted to NSR overnight 11/2: Atrial tachycardia suspected, also bradycardic during sleep likely from sleep apnea. Tolerating Toprol 50 so far.  Monitor on telemetry, rncouraged use of CPAP. Continue to monitor on telemetry   Essential hypertension: On GDMT as above.  Cardiology following continue metoprolol at reduced dose  Elevated troponin: Rather flat -likely  demand ischemia due to CHF  AKI  on CKD3b: Creat1.42 on admission. B/Lpreviously ~1.2-AKI 2/2 CHF/hypotension and diuresis/GDMT Antihypertensive meds Lasix  and GDMT discontinued, creatinine slowly downtrending Recent Labs    05/11/24 2026 05/12/24 1317 05/13/24 0238 05/14/24 0416  05/15/24 0420 05/16/24 0943 05/17/24 0650 05/18/24 0403 05/19/24 0513 05/20/24 0432  BUN 20 16 19  24* 24* 32* 35* 35* 36* 39*  CREATININE 1.42* 1.27* 1.60* 1.58* 1.60* 2.33* 2.42* 2.43* 2.12* 2.07*  CO2 27 26 29 30 30 30 28 25 26  27  K 3.5 3.5 4.0 3.9 4.1 3.9 4.6 4.4 4.4 4.4   Normocytic anemia: Monitor hb-baseline ~8 to 9 g, suspect in the setting of CKD.monitor. Recent Labs  Lab 05/16/24 0943 05/17/24 0650 05/18/24 0403 05/19/24 0513 05/20/24 0432  HGB 9.5* 9.9* 9.6* 9.4* 9.4*  HCT 31.2* 31.6* 31.1* 30.8* 31.0*    History of DVT/PE on chronic anticoagulation since 01/2020: Continue Eliquis    Prolonged QT interval 538. Avoid QT prolonging medications. Correct any electrolyte abnormalities   T2DM without long-term use of insulin: Last available hemoglobin A1c noted to be 5.3 when checked 7 months ago.Cont Jardiance.  Blood sugar 101-127 on lab   Hyperlipidemia: Continue ezetimibe -simvastatin    OSA: Patient had not recently been on CPAP after it broke earlier this year in Feb-has been trying to get a replacement, but has not been able to yet.Ordered CPAP nightly here.  In the process to switch CPAP by [facility] per TOC this admission   GERD: Continue omeprazole  Morbid Obesity w/ Body mass index is 58.67 kg/m.: Will benefit with PCP follow-up, weight loss,healthy lifestyle and outpatient Mounjaro  Mobility: PT Orders: Active  PT Follow up Rec: Home Health Pt11/10/2023 1600    DVT prophylaxis: Eliquis  Code Status:   Code Status: Full Code Family Communication: plan of care discussed with patient at bedside. Patient status is: Remains hospitalized because of severity of illness Level of care: Telemetry   Dispo: The patient is from: home            Anticipated disposition: Initially SNF recommended now recommending home health [facility] in 24 hrs  Objective: Vitals last 24 hrs: Vitals:   05/20/24 0742 05/20/24 0809 05/20/24 0813 05/20/24 1126  BP: (!) 142/78  129/80 129/80 115/70  Pulse: 76 77 73 68  Resp: 19  20 18   Temp: 98.1 F (36.7 C)  98 F (36.7 C) 97.8 F (36.6 C)  TempSrc: Oral  Oral Oral  SpO2: 97%  100% 97%  Weight:      Height:       Physical Examination: General exam:  AAOX3  HEENT:Oral mucosa moist, Ear/Nose WNL grossly Respiratory system: Bilaterally diminished breath sounds on the lungs.   Cardiovascular system: S1 & S2 +, No JVD. Gastrointestinal system: Abdomen soft, obese,ND, BS+ Nervous System: Alert, awake, moving all extremities,and following commands. Extremities: extremities warm, leg edema chronic appearing Skin: Warm, no rashes MSK: Normal muscle bulk,tone, power   Medications reviewed:  Scheduled Meds:  apixaban   5 mg Oral BID   ezetimibe   10 mg Oral QHS   And   simvastatin   40 mg Oral QHS   Fe Fum-Vit C-Vit B12-FA  1 capsule Oral Weekly   metoprolol succinate  50 mg Oral Daily   pantoprazole   40 mg Oral Daily   sodium chloride  flush  3 mL Intravenous Once   sodium chloride  flush  3 mL Intravenous Q12H   Continuous Infusions: Diet: Diet Order             Diet heart healthy/carb  modified Room service appropriate? Yes; Fluid consistency: Thin  Diet effective now

## 2024-05-13 NOTE — TOC Initial Note (Signed)
 Transition of Care [facility]) - Initial/Assessment Note    Patient Details  Name: Joan [name] MRN: [id number] Date of Birth: May 05, 1956  [facility]) CM/SW Contact:    Sudie Erminio Deems, RN Phone Number: 05/13/2024, 4:57 PM  Clinical Narrative: Patient presented for shortness of breath. PTA patient states she is from home alone. Patient has insurance and PCP is at [facility] [address]. Leonce. Patient has concerns regarding CPAP machine from [employer]. Patient states the machine has not been working since February and the face mask did not fit appropriately. Inpatient Case Manager did call [employer] to make them aware of the above findings. Liaison Thomasina has submitted an email to staff regarding the information provided. Inpatient Case Manager will continue to follow for disposition needs as the patient progresses.    Expected Discharge Plan: Skilled Nursing Facility Barriers to Discharge: Continued Medical Work up   Expected Discharge Plan and Services   Discharge Planning Services: CM Consult Post Acute Care Choice: Skilled Nursing Facility Living arrangements for the past 2 months: Apartment   Prior Living Arrangements/Services Living arrangements for the past 2 months: Apartment Lives with:: Self Patient language and need for interpreter reviewed:: Yes Do you feel safe going back to the place where you live?: Yes      Need for Family Participation in Patient Care: Yes (Comment)     Criminal Activity/Legal Involvement Pertinent to Current Situation/Hospitalization: No - Comment as needed  Permission Sought/Granted Permission sought to share information with : Case Manager   Emotional Assessment Appearance:: Appears stated age Attitude/Demeanor/Rapport: Engaged Affect (typically observed): Appropriate Orientation: : Oriented to Self, Oriented to Place, Oriented to  Time Alcohol / Substance Use: Not Applicable Psych Involvement: No  (comment)  Admission diagnosis:  Acute CHF (congestive heart failure) (HCC) [I50.9] Acute on chronic diastolic congestive heart failure (HCC) [I50.33] Patient Active Problem List   Diagnosis Date Noted   Atrial tachycardia 05/13/2024   Acute diastolic heart failure (HCC) 05/12/2024   History of DVT (deep vein thrombosis) 05/12/2024   Chronic anticoagulation 05/12/2024   Prolonged QT interval 05/12/2024   Hypertensive urgency 05/12/2024   CKD stage 3a, GFR 45-59 ml/min (HCC) 05/12/2024   Mild aortic stenosis 11/12/2023   Morbid obesity with BMI of 60.0-69.9, adult (HCC) 11/12/2023   History of pulmonary embolism 11/12/2023   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   Urinary incontinence 11/10/2023   GERD (gastroesophageal reflux disease)    Diabetes mellitus without complication (HCC)    Antalgic gait 04/04/2023   CAP (community acquired pneumonia) 08/05/2022   Acute bronchitis 08/05/2022   Anemia 08/05/2022   DVT of deep femoral vein, left (HCC) 01/26/2020   Class 3 severe obesity without serious comorbidity with body mass index (BMI) of 50.0 to 59.9 in adult (HCC) 01/26/2020   Cough 01/26/2020   Acute pulmonary embolism (HCC) 01/21/2020   Fall from slipping on ice 07/26/2019   Abnormal glucose 05/26/2019   Mixed hyperlipidemia 05/26/2019   Chronic pain of both knees 11/02/2018   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   Breast lump on left side at 4 o'clock position 06/28/2018   Functional urinary incontinence 06/28/2018   Essential hypertension 06/28/2018   Age-related nuclear cataract of both eyes 03/07/2017   Controlled type 2 diabetes mellitus without complication, without long-term current use of insulin (HCC) 03/07/2017   Myopia with astigmatism and presbyopia, bilateral 03/07/2017   Complete tear of right rotator cuff 11/12/2016   Primary localized osteoarthrosis of the knee,  right 11/12/2016   Sleep apnea 10/10/2009   PCP:  Pcp, No Pharmacy:   [id number] - [place], [place] - [address] [place] [address] Phone: [phone] Fax: [phone]  [facility] - [place], [place] - [address] [place] [address] Phone: [phone] Fax: [phone]     Social Drivers of Health (SDOH) Social History: SDOH Screenings   Food Insecurity: No Food Insecurity (11/27/2022)   Received from [facility]  Housing: Unknown (05/13/2024)  Transportation Needs: No Transportation Needs (05/13/2024)  Utilities: Low Risk  (11/27/2022)   Received from [facility]  Alcohol Screen: Low Risk  (05/13/2024)  Depression (PHQ2-9): Low Risk  (11/29/2020)  Financial Resource Strain: Low Risk  (05/13/2024)  Tobacco Use: Low Risk  (03/29/2024)   Received from [facility]   SDOH Interventions: Housing Interventions: Intervention Not Indicated Transportation Interventions: Intervention Not Indicated Alcohol Usage Interventions: Intervention Not Indicated (Score <7) Financial Strain Interventions: Intervention Not Indicated   Readmission Risk Interventions     No data to display

## 2024-05-13 NOTE — Evaluation (Addendum)
 Occupational Therapy Evaluation Patient Details Name: Joan [name] MRN: [id number] DOB: Jan 08, 1956 Today's Date: 05/13/2024   History of Present Illness   68 y.o. female presents with SOB for 3 weeks and chest pain, fluid retention, with medical history significant of hypertension, diastolic congestive heart failure, diabetes mellitus type 2, history of DVT, morbid obesity, OSA presents with shortness of breath and fluid buildup.     Clinical Impressions Pt resting in bed, c/o no pain, still with significant BLE swelling. Pt lives at home with significant other, they help take care of each other, present 24/7. PLOF overall mod I with RW but at times needs help with LB dressing, uses power w/c for transporting items needed for meal prep or dressing/bathing, able to use her mother's old RW for short distances, wheels appear to be bent and it is not able to be adjusted and not at correct height for Pt. Spoke with Pt about Pelvic Floor Therapy, instructed to contact PCP for referral if needed for urinary incontinence. Pt would benefit from bari RW and bari [facility] for return home, recommending postacute rehab <3hrs/day to allow for edema to improve to maximize mobility and transfers, and independence with ADLs, if quickly improves back to mod I for transfer to w/c or BSC, HHOT recommended.     If plan is discharge home, recommend the following:   Two people to help with walking and/or transfers;A lot of help with bathing/dressing/bathroom;Assistance with cooking/housework;Assist for transportation;Help with stairs or ramp for entrance     Functional Status Assessment   Patient has had a recent decline in their functional status and demonstrates the ability to make significant improvements in function in a reasonable and predictable amount of time.     Equipment Recommendations   Other (comment) (bari RW, bari [facility])     Recommendations for Other Services          Precautions/Restrictions   Precautions Precautions: Fall Restrictions Weight Bearing Restrictions Per Provider Order: No     Mobility Bed Mobility Overal bed mobility: Needs Assistance Bed Mobility: Supine to Sit     Supine to sit: Min assist, HOB elevated, Used rails     General bed mobility comments: doing well with bed mobility, min A for BLEs.    Transfers Overall transfer level: Needs assistance Equipment used: Rolling walker (2 wheels) Transfers: Sit to/from Stand Sit to Stand: Max assist, +2 physical assistance, From elevated surface           General transfer comment: max A x2 from elevated surface, was not able to take pivot steps to recliner or BSC      Balance Overall balance assessment: Needs assistance Sitting-balance support: No upper extremity supported, Feet supported Sitting balance-Leahy Scale: Good     Standing balance support: Bilateral upper extremity supported, During functional activity, Reliant on assistive device for balance Standing balance-Leahy Scale: Poor Standing balance comment: max A x2 assist, leaning forward using RW for support                           ADL either performed or assessed with clinical judgement   ADL Overall ADL's : Needs assistance/impaired Eating/Feeding: Set up;Sitting   Grooming: Set up;Sitting   Upper Body Bathing: Minimal assistance;Sitting   Lower Body Bathing: Maximal assistance;Sitting/lateral leans   Upper Body Dressing : Minimal assistance;Sitting   Lower Body Dressing: Maximal assistance;Sitting/lateral leans       Toileting- Clothing Manipulation and Hygiene: Total assistance;Bed  level         General ADL Comments: Pt abel to sit EOB with good balance, limited with standing due to significant BLE swelling, was not able to transfer when attempting. Pt max/total for LB ADLs, bed level for toileting.     Vision Baseline Vision/History: 0 No visual deficits Ability to See  in Adequate Light: 0 Adequate Patient Visual Report: No change from baseline       Perception         Praxis         Pertinent Vitals/Pain Pain Assessment Pain Assessment: No/denies pain     Extremity/Trunk Assessment Upper Extremity Assessment Upper Extremity Assessment: Overall WFL for tasks assessed   Lower Extremity Assessment Lower Extremity Assessment: Defer to PT evaluation       Communication Communication Communication: No apparent difficulties   Cognition Arousal: Alert Behavior During Therapy: WFL for tasks assessed/performed Cognition: No apparent impairments                               Following commands: Intact       Cueing  General Comments   Cueing Techniques: Verbal cues      Exercises     Shoulder Instructions      Home Living Family/patient expects to be discharged to:: Private residence Living Arrangements: Spouse/significant other Available Help at Discharge: Family;Available 24 hours/day Type of Home: Apartment Home Access: Level entry     Home Layout: One level     Bathroom Shower/Tub: Chief Strategy Officer: Handicapped height     Home Equipment: Tub bench;Hand held shower head;Wheelchair - Surveyor, Quantity (2 wheels) (need bari walker and BSC)   Additional Comments: Pt lives with significant other, has old RW but needs new Bari RW and [facility]      Prior Functioning/Environment Prior Level of Function : Needs assist             Mobility Comments: power w/c in mornings or ADLs for rest breaks, RW for short distances ADLs Comments: significant other helps with socks/shoes, uses diapers, does cooking uses w/c for transporting items    OT Problem List: Decreased strength;Decreased range of motion;Decreased activity tolerance;Impaired balance (sitting and/or standing);Increased edema;Obesity   OT Treatment/Interventions: Self-care/ADL training;Therapeutic exercise;Energy conservation;DME  and/or AE instruction;Therapeutic activities;Patient/family education;Balance training      OT Goals(Current goals can be found in the care plan section)   Acute Rehab OT Goals Patient Stated Goal: to manage swelling in BLEs, improve strength OT Goal Formulation: With patient Time For Goal Achievement: 05/27/24 Potential to Achieve Goals: Good   OT Frequency:  Min 2X/week    Co-evaluation              AM-PAC OT 6 Clicks Daily Activity     Outcome Measure Help from another person eating meals?: None Help from another person taking care of personal grooming?: A Little Help from another person toileting, which includes using toliet, bedpan, or urinal?: Total Help from another person bathing (including washing, rinsing, drying)?: A Lot Help from another person to put on and taking off regular upper body clothing?: A Little Help from another person to put on and taking off regular lower body clothing?: A Lot 6 Click Score: 15   End of Session Equipment Utilized During Treatment: Gait belt;Rolling walker (2 wheels) Nurse Communication: Mobility status  Activity Tolerance: Patient tolerated treatment well Patient left: in bed;with call bell/phone within  reach;Other (comment);with family/visitor present (sitting EOB eating)  OT Visit Diagnosis: Unsteadiness on feet (R26.81);Other abnormalities of gait and mobility (R26.89);Muscle weakness (generalized) (M62.81)                Time: 8871-8799 OT Time Calculation (min): 32 min Charges:  OT General Charges $OT Visit: 1 Visit OT Evaluation $OT Eval Moderate Complexity: 1 Mod OT Treatments $Self Care/Home Management : 8-22 mins  [address], OTR/L   Joan [name] 05/13/2024, 12:09 PM

## 2024-05-13 NOTE — Progress Notes (Addendum)
 Progress Note  Patient Name: Joan [name] Date of Encounter: 05/13/2024 [facility] Cardiologist: Jennifer JONELLE Crape, MD   Interval Summary   Reports improved shortness of breath and lower extremity edema. Denies chest pain.  Vital Signs Vitals:   05/12/24 1932 05/12/24 2331 05/13/24 0401 05/13/24 0804  BP: 135/81 135/83 (!) 142/79   Pulse: 80 78 86   Resp: 15 20 20    Temp: 97.8 F (36.6 C) 97.6 F (36.4 C) 98.5 F (36.9 C) 98.6 F (37 C)  TempSrc: Oral Oral Oral   SpO2: 96% 96% 96%   Weight:   (!) 158.9 kg   Height:        Intake/Output Summary (Last 24 hours) at 05/13/2024 0809 Last data filed at 05/12/2024 2242 Gross per 24 hour  Intake 200 ml  Output 2550 ml  Net -2350 ml      05/13/2024    4:01 AM 05/12/2024   11:10 AM 05/11/2024    8:20 PM  Last 3 Weights  Weight (lbs) 350 lb 5 oz 387 lb 9.1 oz 395 lb  Weight (kg) 158.9 kg 175.8 kg 179.171 kg      Telemetry/ECG  Normal sinus rhythm with heart rates in the 80's. Continues to have multiple episodes brief of AT vs reentrant tachycardia in the 120's - Personally Reviewed  Physical Exam  GEN: No acute distress alert and orientated on room air. Neck: unable to assess due to body habitus. Cardiac: RRR, no murmurs, rubs, or gallops.  Respiratory: Bibasilar crackles. GI: Soft, nontender, non-distended  MS: 1+ bilateral lower extremity edema  Assessment & Plan   Joan [name] is a 68 y.o. 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 Claudene.   Acute on chronic heart failure with moderately reduced LVEF Mild aortic stenosis Was hospitalized for worsening shortness of breath over the past 2 to 3 weeks. Had elevated proBNP of 4971 Was felt to be volume up and started on IV Lasix  Echo on 05/12/2024 showed reduced LVEF of 45 to 50%, no  RWMA, mild concentric LVH, G1 DD, mildly reduced RV systolic function, moderately dilated left and right atrium, moderate mitral regurgitation, moderate MAC, elevated LVEDP, mild aortic stenosis with a mean gradient of 12 mmHg, and IVC with less than 50% respiratory variability indicating elevated right atrial pressure.  The LVEF is down from prior echo on 11/2023 when it was 60 to 65%. I's and O's are net out 3.3 L.  Weight is documented to be down 45 pounds.  Doubt this is accurate. 80 mg IV Lasix  twice daily GDMT Continue losartan 100 mg daily.  Will avoid switching to Entresto due to having cough with lisinopril . Continue Aldactone 25 mg daily. Continue Jardiance 10 mg daily. stop Coreg 6.25 mg twice daily. Start Metoprolol succinate 100mg  daily. Start Hydralazine 50mg  tid.   Hyperlipidemia No coronary calcifications mentioned on CT angiography. Continue simvastatin  40 mg daily. Continue Zetia  10 mg daily.   Elevated high-sensitivity troponins 43 >43 Suspect is likely secondary to demand ischemia from acute on chronic heart failure.   SVT/atrial tachycardia Continuing to have multiple episodes of tachycardia in the 120's. Denies any symptoms such as tachycardia and palpitations. Start metoprolol as above.   QTc prolongation Most recent EKG showed a QTcB of 475.   Morbid obesity OSA OSA likely contributing to heart failure.  Was  recommended to wear CPAP.   DVT/PE On Eliquis  5 mg daily   Hypertension Blood pressure has improved since restarting home blood pressure medications and starting Coreg. Management per GDMT above   CKD stage IIIa Creatinine 1.27 on 10/29   Anemia Hemoglobin 8.9.  Normocytic.  Appears to be somewhat stable.     For questions or updates, please contact [facility] Please consult [email or web address] for contact info under        Signed, Lai Hendriks, PA-C

## 2024-05-14 ENCOUNTER — Encounter (HOSPITAL_COMMUNITY): Payer: Self-pay

## 2024-05-14 DIAGNOSIS — N1831 Chronic kidney disease, stage 3a: Secondary | ICD-10-CM | POA: Diagnosis not present

## 2024-05-14 DIAGNOSIS — I4719 Other supraventricular tachycardia: Secondary | ICD-10-CM | POA: Diagnosis not present

## 2024-05-14 DIAGNOSIS — I5031 Acute diastolic (congestive) heart failure: Secondary | ICD-10-CM | POA: Diagnosis not present

## 2024-05-14 LAB — CBC WITH DIFFERENTIAL/PLATELET
Abs Immature Granulocytes: 0.02 K/uL (ref 0.00–0.07)
Basophils Absolute: 0 K/uL (ref 0.0–0.1)
Basophils Relative: 0 %
Eosinophils Absolute: 0.1 K/uL (ref 0.0–0.5)
Eosinophils Relative: 1 %
HCT: 30.3 % — ABNORMAL LOW (ref 36.0–46.0)
Hemoglobin: 9.3 g/dL — ABNORMAL LOW (ref 12.0–15.0)
Immature Granulocytes: 0 %
Lymphocytes Relative: 17 %
Lymphs Abs: 1.5 K/uL (ref 0.7–4.0)
MCH: 25.8 pg — ABNORMAL LOW (ref 26.0–34.0)
MCHC: 30.7 g/dL (ref 30.0–36.0)
MCV: 84.2 fL (ref 80.0–100.0)
Monocytes Absolute: 0.8 K/uL (ref 0.1–1.0)
Monocytes Relative: 8 %
Neutro Abs: 6.9 K/uL (ref 1.7–7.7)
Neutrophils Relative %: 74 %
Platelets: 226 K/uL (ref 150–400)
RBC: 3.6 MIL/uL — ABNORMAL LOW (ref 3.87–5.11)
RDW: 14.6 % (ref 11.5–15.5)
WBC: 9.3 K/uL (ref 4.0–10.5)
nRBC: 0 % (ref 0.0–0.2)

## 2024-05-14 LAB — BASIC METABOLIC PANEL WITH GFR
Anion gap: 11 (ref 5–15)
BUN: 24 mg/dL — ABNORMAL HIGH (ref 8–23)
CO2: 30 mmol/L (ref 22–32)
Calcium: 8.8 mg/dL — ABNORMAL LOW (ref 8.9–10.3)
Chloride: 100 mmol/L (ref 98–111)
Creatinine, Ser: 1.58 mg/dL — ABNORMAL HIGH (ref 0.44–1.00)
GFR, Estimated: 36 mL/min — ABNORMAL LOW (ref >60)
Glucose, Bld: 104 mg/dL — ABNORMAL HIGH (ref 70–99)
Potassium: 3.9 mmol/L (ref 3.5–5.1)
Sodium: 141 mmol/L (ref 135–145)

## 2024-05-14 LAB — TSH: TSH: 0.377 u[IU]/mL (ref 0.350–4.500)

## 2024-05-14 MED ORDER — HYDRALAZINE HCL 50 MG PO TABS
100.0000 mg | ORAL_TABLET | Freq: Three times a day (TID) | ORAL | Status: DC
  Administered 2024-05-14 – 2024-05-17 (×8): 100 mg via ORAL
  Filled 2024-05-14 (×9): qty 2

## 2024-05-14 MED ORDER — METOPROLOL SUCCINATE ER 50 MG PO TB24
50.0000 mg | ORAL_TABLET | Freq: Every day | ORAL | Status: DC
  Administered 2024-05-14 – 2024-05-21 (×6): 50 mg via ORAL
  Filled 2024-05-14 (×8): qty 1

## 2024-05-14 NOTE — Progress Notes (Addendum)
 Progress Note  Patient Name: Joan [name] Date of Encounter: 05/14/2024  Primary Cardiologist: Jennifer JONELLE Crape, MD  Subjective   Had nausea/vomiting last night that resolved. No CP. SOB slowly improving. Continues to diurese. Edema persists. Monitor this AM shows frequent episodes of bradycardia, often times looking like 2nd degree type 1 AVB but at times 2:1 AV block and some junctional beating, interspersed with NSR and atrial tach. She was primarily asleep during the episodes of bradycardia and asymptomatic.  Inpatient Medications    Scheduled Meds:  apixaban   5 mg Oral BID   empagliflozin  10 mg Oral Daily   ezetimibe   10 mg Oral QHS   And   simvastatin   40 mg Oral QHS   furosemide   80 mg Intravenous BID   hydrALAZINE  100 mg Oral Q8H   losartan  100 mg Oral Daily   metoprolol succinate  100 mg Oral Daily   pantoprazole   40 mg Oral Daily   potassium chloride   40 mEq Oral BID   sodium chloride  flush  3 mL Intravenous Once   sodium chloride  flush  3 mL Intravenous Q12H   spironolactone  25 mg Oral Daily   Continuous Infusions:  PRN Meds: acetaminophen **OR** acetaminophen, albuterol , alum & mag hydroxide-simeth, hydrocortisone  cream, trimethobenzamide   Vital Signs    Vitals:   05/13/24 2335 05/14/24 0355 05/14/24 0759 05/14/24 0800  BP: (!) 168/93 (!) 156/98 116/76 116/76  Pulse: 85 80 80 76  Resp: 18 18 18 20   Temp: 97.8 F (36.6 C) 98.2 F (36.8 C) 98.2 F (36.8 C)   TempSrc: Oral Oral Oral   SpO2: 99% 99% 96% 98%  Weight:  (!) 173.2 kg    Height:        Intake/Output Summary (Last 24 hours) at 05/14/2024 0911 Last data filed at 05/14/2024 0800 Gross per 24 hour  Intake 478 ml  Output 2700 ml  Net -2222 ml      05/14/2024    3:55 AM 05/13/2024    4:01 AM 05/12/2024   11:10 AM  Last 3 Weights  Weight (lbs) 381 lb 13.4 oz 350 lb 5 oz 387 lb 9.1 oz  Weight (kg) 173.2 kg 158.9 kg 175.8 kg     Telemetry    See below - Personally  Reviewed  Physical Exam   GEN: No acute distress. Morbidly obese.  HEENT: Normocephalic, atraumatic, sclera non-icteric. Neck: No JVD or bruits. Cardiac: RRR no murmurs, rubs, or gallops.  Respiratory: diminished to auscultation bilaterally. Breathing is unlabored. GI: Soft, nontender, non-distended, BS +x 4. MS: no deformity. Extremities: No clubbing or cyanosis. 2+ BLE edema. Distal pedal pulses are 2+ and equal bilaterally. Neuro:  AAOx3. Follows commands. Psych:  Responds to questions appropriately with a normal affect.  Labs    High Sensitivity Troponin:  No results for input(s): TROPONINIHS in the last 720 hours.    Cardiac EnzymesNo results for input(s): TROPONINI in the last 168 hours. No results for input(s): TROPIPOC in the last 168 hours.   Chemistry Recent Labs  Lab 05/11/24 2026 05/12/24 1317 05/13/24 0238 05/14/24 0416  NA 144 142 142 141  K 3.5 3.5 4.0 3.9  CL 106 100 101 100  CO2 27 26 29 30   GLUCOSE 127* 96 111* 104*  BUN 20 16 19  24*  CREATININE 1.42* 1.27* 1.60* 1.58*  CALCIUM 9.1 8.5* 8.6* 8.8*  PROT 6.6  --   --   --   ALBUMIN 3.7  --   --   --  AST 24  --   --   --   ALT 19  --   --   --   ALKPHOS 94  --   --   --   BILITOT 0.4  --   --   --   GFRNONAA 41* 47* 35* 36*  ANIONGAP 11 16* 12 11     Hematology Recent Labs  Lab 05/11/24 2026 05/13/24 0238 05/14/24 0416  WBC 7.1 7.4 9.3  RBC 3.43* 3.59* 3.60*  HGB 8.9* 9.2* 9.3*  HCT 28.9* 30.9* 30.3*  MCV 84.3 86.1 84.2  MCH 25.9* 25.6* 25.8*  MCHC 30.8 29.8* 30.7  RDW 14.7 14.7 14.6  PLT 216 204 226    BNP Recent Labs  Lab 05/11/24 2026  PROBNP 4,971.0*     DDimer No results for input(s): DDIMER in the last 168 hours.   Radiology    ECHOCARDIOGRAM COMPLETE Result Date: 05/12/2024    ECHOCARDIOGRAM REPORT   Patient Name:   Joan [name] Date of Exam: 05/12/2024 Medical Rec #:  [id number]          Height:       66.0 in Accession #:    [id number]         Weight:        387.6 lb Date of Birth:  1955-11-20          BSA:          2.649 m Patient Age:    67 years           BP:           155/92 mmHg Patient Gender: F                  HR:           60 bpm. Exam Location:  Inpatient Procedure: 2D Echo, Cardiac Doppler and Color Doppler (Both Spectral and Color            Flow Doppler were utilized during procedure). Indications:    I50.40* Unspecified combined systolic (congestive) and diastolic                 (congestive) heart failure  History:        Patient has prior history of Echocardiogram examinations, most                 recent 12/11/2023. CHF, Aortic Valve Disease; Risk Factors:Sleep                 Apnea, Hypertension, Dyslipidemia and Diabetes. Mild aortic                 stenosis. Pulmonary embolus.m.  Sonographer:    Ellouise Mose RDCS Referring Phys: [id number] RONDELL A SMITH  Sonographer Comments: Patient is obese. Image acquisition challenging due to patient body habitus. IMPRESSIONS  1. Ekg changes during exam. . Left ventricular ejection fraction, by estimation, is 45 to 50%. Left ventricular ejection fraction by 2D MOD biplane is 47.5 %. The left ventricle has mildly decreased function. The left ventricle has no regional wall motion abnormalities. The left ventricular internal cavity size was moderately dilated. There is mild concentric left ventricular hypertrophy. Left ventricular diastolic parameters are consistent with Grade I diastolic dysfunction (impaired relaxation). Elevated left ventricular end-diastolic pressure.  2. Right ventricular systolic function is mildly reduced. The right ventricular size is moderately enlarged. There is normal pulmonary artery systolic pressure. The estimated right ventricular systolic pressure is 33.8 mmHg.  3. Left  atrial size was moderately dilated.  4. Right atrial size was moderately dilated.  5. The mitral valve is degenerative. Moderate mitral valve regurgitation. Mild mitral stenosis. Moderate mitral annular calcification.   6. The aortic valve is calcified. Aortic valve regurgitation is trivial. Mild aortic valve stenosis. Aortic valve area, by VTI measures 1.91 cm. Aortic valve mean gradient measures 12.0 mmHg. Aortic valve Vmax measures 2.36 m/s. Peak gradient 22.3 mmHg, DI 0.46.  7. Aortic dilatation noted. There is mild dilatation of the ascending aorta, measuring 42 mm.  8. The inferior vena cava is dilated in size with <50% respiratory variability, suggesting right atrial pressure of 15 mmHg.  9. Cannot exclude a [place] PFO. Comparison(s): The left ventricular function is worsened. FINDINGS  Left Ventricle: Ekg changes during exam. Left ventricular ejection fraction, by estimation, is 45 to 50%. Left ventricular ejection fraction by 2D MOD biplane is 47.5 %. The left ventricle has mildly decreased function. The left ventricle has no regional wall motion abnormalities. The left ventricular internal cavity size was moderately dilated. There is mild concentric left ventricular hypertrophy. Left ventricular diastolic parameters are consistent with Grade I diastolic dysfunction (impaired  relaxation). Elevated left ventricular end-diastolic pressure. Right Ventricle: The right ventricular size is moderately enlarged. No increase in right ventricular wall thickness. Right ventricular systolic function is mildly reduced. There is normal pulmonary artery systolic pressure. The tricuspid regurgitant velocity is 2.17 m/s, and with an assumed right atrial pressure of 15 mmHg, the estimated right ventricular systolic pressure is 33.8 mmHg. Left Atrium: Left atrial size was moderately dilated. Right Atrium: Right atrial size was moderately dilated. Pericardium: There is no evidence of pericardial effusion. Mitral Valve: The mitral valve is degenerative in appearance. There is moderate calcification of the posterior mitral valve leaflet(s). Moderate mitral annular calcification. Moderate mitral valve regurgitation. Mild mitral valve stenosis.  MV peak gradient, 10.0 mmHg. The mean mitral valve gradient is 4.0 mmHg. Tricuspid Valve: The tricuspid valve is normal in structure. Tricuspid valve regurgitation is trivial. No evidence of tricuspid stenosis. Aortic Valve: The aortic valve is calcified. Aortic valve regurgitation is trivial. Mild aortic stenosis is present. Aortic valve mean gradient measures 12.0 mmHg. Aortic valve peak gradient measures 22.3 mmHg. Aortic valve area, by VTI measures 1.91 cm. Pulmonic Valve: The pulmonic valve was normal in structure. Pulmonic valve regurgitation is not visualized. No evidence of pulmonic stenosis. Aorta: Aortic dilatation noted and the aortic root is normal in size and structure. There is mild dilatation of the ascending aorta, measuring 42 mm. Venous: The inferior vena cava is dilated in size with less than 50% respiratory variability, suggesting right atrial pressure of 15 mmHg. IAS/Shunts: There is right bowing of the interatrial septum, suggestive of elevated left atrial pressure. Cannot exclude a [place] PFO.  LEFT VENTRICLE PLAX 2D                        Biplane EF (MOD) LVIDd:         6.00 cm         LV Biplane EF:   Left LVIDs:         4.20 cm                          ventricular LV PW:         1.20 cm  ejection LV IVS:        1.10 cm                          fraction by LVOT diam:     2.30 cm                          2D MOD LV SV:         92                               biplane is LV SV Index:   35                               47.5 %. LVOT Area:     4.15 cm LV IVRT:       92 msec  LV Volumes (MOD) LV vol d, MOD    179.0 ml A2C: LV vol d, MOD    146.6 ml A4C: LV vol s, MOD    99.0 ml A2C: LV vol s, MOD    75.6 ml A4C: LV SV MOD A2C:   80.1 ml LV SV MOD A4C:   146.6 ml LV SV MOD BP:    78.1 ml IVC IVC diam: 2.60 cm  PULMONARY VEINS Diastolic Velocity: 45.10 cm/s S/D Velocity:       1.40 Systolic Velocity:  63.00 cm/s LEFT ATRIUM             Index        RIGHT ATRIUM            Index LA diam:        4.30 cm 1.62 cm/m   RA Area:     19.10 cm LA Vol (A2C):   65.4 ml 24.69 ml/m  RA Volume:   56.10 ml  21.18 ml/m LA Vol (A4C):   94.5 ml 35.67 ml/m LA Biplane Vol: 80.4 ml 30.35 ml/m  AORTIC VALVE AV Area (Vmax):    1.88 cm AV Area (Vmean):   1.94 cm AV Area (VTI):     1.91 cm AV Vmax:           236.00 cm/s AV Vmean:          158.200 cm/s AV VTI:            0.483 m AV Peak Grad:      22.3 mmHg AV Mean Grad:      12.0 mmHg LVOT Vmax:         107.00 cm/s LVOT Vmean:        73.900 cm/s LVOT VTI:          0.222 m LVOT/AV VTI ratio: 0.46  AORTA Ao Root diam: 3.30 cm Ao Asc diam:  4.15 cm MITRAL VALVE                TRICUSPID VALVE MV Area (PHT): 3.95 cm     TR Peak grad:   18.8 mmHg MV Area VTI:   1.78 cm     TR Vmax:        217.00 cm/s MV Peak grad:  10.0 mmHg MV Mean grad:  4.0 mmHg     SHUNTS MV Vmax:       1.58 m/s     Systemic VTI:  0.22 m MV Vmean:  89.3 cm/s    Systemic Diam: 2.30 cm MV Decel Time: 192 msec MV E velocity: 134.25 cm/s MV A velocity: 99.37 cm/s MV E/A ratio:  1.35 Vinie Maxcy MD Electronically signed by Vinie Maxcy MD Signature Date/Time: 05/12/2024/4:24:53 PM    Final     Cardiac Studies   2d echo 05/12/24   1. Ekg changes during exam. . Left ventricular ejection fraction, by  estimation, is 45 to 50%. Left ventricular ejection fraction by 2D MOD  biplane is 47.5 %. The left ventricle has mildly decreased function. The  left ventricle has no regional wall  motion abnormalities. The left ventricular internal cavity size was  moderately dilated. There is mild concentric left ventricular hypertrophy.  Left ventricular diastolic parameters are consistent with Grade I  diastolic dysfunction (impaired relaxation).  Elevated left ventricular end-diastolic pressure.   2. Right ventricular systolic function is mildly reduced. The right  ventricular size is moderately enlarged. There is normal pulmonary artery  systolic pressure. The estimated right  ventricular systolic pressure is  33.8 mmHg.   3. Left atrial size was moderately dilated.   4. Right atrial size was moderately dilated.   5. The mitral valve is degenerative. Moderate mitral valve regurgitation.  Mild mitral stenosis. Moderate mitral annular calcification.   6. The aortic valve is calcified. Aortic valve regurgitation is trivial.  Mild aortic valve stenosis. Aortic valve area, by VTI measures 1.91 cm.  Aortic valve mean gradient measures 12.0 mmHg. Aortic valve Vmax measures  2.36 m/s. Peak gradient 22.3  mmHg, DI 0.46.   7. Aortic dilatation noted. There is mild dilatation of the ascending  aorta, measuring 42 mm.   8. The inferior vena cava is dilated in size with <50% respiratory  variability, suggesting right atrial pressure of 15 mmHg.   9. Cannot exclude a [place] PFO.   Comparison(s): The left ventricular function is worsened.   Patient Profile     68 y.o. female with morbid obesity (BMI 56), sleep apnea, hypertension, hyperlipidemia, DM, mild aortic stenosis, DVT/PE, CKD 3A, prior PE after Covid vaccine admitted with worsening SOB and CHF exacerbation. EF reduced from prior to 45-50%.  Assessment & Plan    1. Acute HFmrEF - 2d echo with EF 45-50%, mild LVH, G1DD, mildly reduced RV function, moderate BAE, mild AS, moderate MR, mild MS, [place] PFO, dilated IVC - being actively diuresed with stable renal function, continues to diurese briskly - continue Lasix  80mg  IV BID, losartan 100mg  daily, Toprol 100mg  daily, Jardiance 10mg  daily, spironolactone 25mg  daily, KCl 49meq BID daily - Dr. Barbaraann increased hydralazine to 100mg  TID today  2. Mildly elevated troponin - low/flat, felt to reflect demand ischemia - await MD input whether any plan for ischemic workup given decline in LVEF, troponin leak - CTA neg for PE  3. Mild AS, mild MS, moderate MR, mild dilation of ascending aorta, possible [place] PFO - follow clinically, will need OP monitoring  4. SVT vs  atrial tachycardia, episodic bradycardia  - Appears to be having runs of atrial tachycardia, some of which has been irregular, cannot exclude PAF. She is anticoagulated indefinitely for PE so this will be continued - Transitioned to metoprolol succinate 100 mg daily-  this AM has been having drops in HR to the 30s - tele shows second degree type 1 AVB but at times 2:1 AV block and some junctional beating, interspersed with NSR and atrial tach.  Will hold metoprolol and have MD review telemetry. Primarily sleeping during episodes.  Addendum: d/w Dr. Barbaraann, decrease Toprol to 50mg  daily starting today - She has untreated sleep apnea which is likely contributing to this - add TSH to labs  5 .History of DVT/PE - on anticoagulation already  6. Morbid obesity/OSA contributing - untreated OSA felt to be contributing to presentation, will need OP follow-up  7. Chronic anemia - prior Hgb 9's, here generally stable - per medicine team  8. CKD stage 3b - last OP Cr 03/2024 1.54, ranging 1.4-1.6 here, follow   For questions or updates, please contact [facility] Please consult [email or web address] for contact info under Cardiology/STEMI.  Signed, Delmus Warwick N Morgyn Marut, PA-C 05/14/2024, 9:11 AM

## 2024-05-14 NOTE — TOC CM/SW Note (Signed)
    Durable Medical Equipment  (From admission, onward)           Start     Ordered   05/14/24 1544  For home use only DME Bedside commode  Once       Comments: Bariatric  Question:  Patient needs a bedside commode to treat with the following condition  Answer:  Acute congestive heart failure (HCC)   05/14/24 1543   05/14/24 1542  For home use only DME Walker rolling  Once       Comments: Bariatric Rolling walker  Question Answer Comment  Walker: With 5 Inch Wheels   Patient needs a walker to treat with the following condition Acute congestive heart failure (HCC)      05/14/24 1543

## 2024-05-14 NOTE — Progress Notes (Signed)
   Heart Failure Stewardship Pharmacist Progress Note   PCP: Pcp, No PCP-Cardiologist: Jennifer JONELLE Crape, MD    HPI:  68 yo F with PMH of T2DM, HTN, HLD, OSA, DVT/PE, CKD III, and CHF.   Presented to the ED on 10/28 with worsening shortness of breath and LE edema. BNP elevated. CXR with no acute findings, stable cardiomegaly. CTA negative for PE. ECHO 10/29 with LVEF 45-50% (60-65% in 11/2023), no RWMA, G1DD, RV mildly reduced, moderate MR.   States she is still having shortness of breath. Requiring 2L , not on supplemental O2 at home. Tells me she hasn't used a CPAP in several months because hers was broken. Still appears volume overloaded but difficult to assess with body habitus. Reviewed changes to GDMT and goals of therapy. Does not have a pill box at home but will get one. Reports taking all medications (including Eliquis ) at one time per day. She was taking both Eliquis  tablets at the same time of the day so she wouldn't forget to take the evening dose. Education provided.   Current HF Medications: Diuretic: furosemide  80 mg IV BID ACE/ARB/ARNI: losartan 100 mg daily MRA: spironolactone 25 mg daily SGLT2i: Jardiance 10 mg daily  Prior to admission HF Medications: Diuretic: furosemide  40 mg daily ACE/ARB/ARNI: losartan/hctz 100/25 mg daily SGLT2i: Jardiance 10 mg daily  Pertinent Lab Values: Serum creatinine 1.58, BUN 24, Potassium 3.9, Sodium 141, BNP 4971   Vital Signs: Weight: 381 lbs (admission weight: 387 lbs) Blood pressure: 110/70-150/90s  Heart rate: 70-80s  I/O: net -2.2L yesterday; net -6L since admission  Medication Assistance / Insurance Benefits Check: Does the patient have prescription insurance?  Yes Type of insurance plan: [employer]  Outpatient Pharmacy:  Prior to admission outpatient pharmacy: [facility] Is the patient willing to use [facility] TOC pharmacy at discharge? Yes Is the patient willing to transition their outpatient pharmacy to utilize a [facility] outpatient pharmacy?   No    Assessment: 1. Acute on chronic diastolic CHF (LVEF 45-50%). NYHA class III symptoms. - Continue furosemide  80 mg IV BID. Strict I/Os and daily weights. Keep K>4 and Mg>2. - Holding BB - frequent episodes of bradycardia and AVB - Continue losartan 100 mg daily - reports cough with lisinopril  - Continue spironolactone 25 mg daily - Continue Jardiance 10 mg daily  Plan: 1) Medication changes recommended at this time: - Continue IV diuresis  2) Patient assistance: - None pending  3)  Education  - Patient has been educated on current HF medications and potential additions to HF medication regimen - Patient verbalizes understanding that over the next few months, these medication doses may change and more medications may be added to optimize HF regimen - Patient has been educated on basic disease state pathophysiology and goals of therapy - Educated on the importance of taking medications twice daily and reviewed strategies to help with compliance   Duwaine Plant, PharmD, BCPS Heart Failure Stewardship Pharmacist Phone [phone]

## 2024-05-14 NOTE — Progress Notes (Signed)
 PROGRESS NOTE Joan [name]  FMW:[id number] DOB: [birth year] DOA: 05/11/2024 PCP: Pcp, No  Brief Narrative/Hospital Course: Joan [name] is a 68 y.o. female with PMH of hypertension, diastolic congestive heart failure, diabetes mellitus type 2, history of DVT, morbid obesity, OSA presents with shortness of breath x  3 weeks and on 10/28 night, she had mild chest pain located in the upper chest area, which she thinks might be related to reflux. She is not on oxygen therapy. She has been dealing with fluid retention that has persisted despite taking 40 mg of Lasix  for seven days.She describes issues with her lymphatic system, noting that the fluid is not moving. She is not currently on oxygen but has been using a CPAP machine, which broke back in February. Despite efforts, she has not received a replacement CPAP machine. She receives Jelsyn three shots every six months for an unspecified condition, as she does not take cortisone. In the ZI:jqzampoz with pulse 73-1 41, respirations 17-39, blood pressures elevated up to 179/115, and O2 saturations currently maintained on 4 L nasal cannula oxygen.  Labs significant for BNP 4971, high-sensitivity troponins 43 ->43, hemoglobin 8.9, BUN 20, and creatinine 1.42.  Chest x-ray showed no acute abnormality CT angiogram of the chest noted no pulmonary embolism with enlarged main pulmonary artery measuring 3.3 cm and cardiomegaly. Patient admitted cardiology consulted and managed with aggressive diuresis/GDMT  Subjective: Seen and examined Reports still being short of breath, overall does feel better Overnight afebrile  SBP ~150s, Creatinine slightly improving 1.5 from 1.6, hemoglobin 9.3 chronic anemia Urine output much better 3125 cc weight down from 395 lb> 387>350>?381lb?? 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 Cardiology following, on IV diuresis 80 twice daily.GDMT-Coreg changed to Toprol -decreasing dose due to bradycardia, continue losartan, Aldactone and hydralazine. Wt on admission 295 lb and improving. Cont to monitor daily I/O,weight, electrolytes and net balance as below.Keep on  salt/fluid restricted diet and monitor in tele. Net IO Since Admission: -5,897 mL [05/14/24 1040]  Filed Weights   05/12/24 1110 05/13/24 0401 05/14/24 0355  Weight: (!) 175.8 kg (!) 158.9 kg (!) 173.2 kg    Recent Labs  Lab 05/11/24 2026 05/12/24 1317 05/13/24 0238 05/14/24 0416  PROBNP 4,971.0*  --   --   --   BUN 20 16 19  24*  CREATININE 1.42* 1.27* 1.60* 1.58*  K 3.5 3.5 4.0 3.9   Essential hypertension: BP stable, continue CHF meds as above  Elevated troponin: Rather flat -likely  demand ischemia due to CHF  SVT versus atrial tachycardia with episode of bradycardia during sleep: Atrial tachycardia suspected, also bradycardic during sleep likely from sleep apnea.   Given her bradycardia -decreasing metoprolol per cardio.Encouraged use of CPAP. Continue to monitor on telemetry   Aki on CKDIIIb Creatinine noted to be 1.42 on admission.  B/Lpreviously noted to be around 1.2.  Creatinine stable compared to yesterday continue diuresis and monitor closely Recent Labs    12/05/23 2221 05/11/24 2026 05/12/24 1317 05/13/24 0238 05/14/24 0416  BUN 31* 20 16 19  24*  CREATININE 1.25* 1.42* 1.27* 1.60* 1.58*  CO2 25 27 26 29 30   K 4.0 3.5 3.5 4.0 3.9   Normocytic anemia:  Monitor hb-baseline ~8 to 9 g, suspect in the setting of CKD.monitor. Recent Labs  Lab 05/11/24 2026 05/13/24 0238 05/14/24 0416  HGB 8.9* 9.2* 9.3*  HCT 28.9* 30.9* 30.3*    History of DVT/PE on chronic anticoagulation since 01/2020: Continue Eliquis     Prolonged QT interval 538. Avoid QT prolonging medications. Correct any electrolyte abnormalities   T2DM without long-term use of insulin: Last available hemoglobin A1c noted to be 5.3 when checked 7 months ago.Cont Jardiance   Hyperlipidemia Continue ezetimibe -simvastatin    OSA Patient had not recently been on CPAP after it broke earlier this year in Feb,she has been trying to get a replacement, but has not been able to yet. Continue CPAP nightly as able.   GERD Continue omeprazole  Morbid Obesity w/ Body mass index is 61.63 kg/m.: Will benefit with PCP follow-up, weight loss,healthy lifestyle and outpatient Mounjaro  Mobility: PT Orders: Active  PT Follow up Rec:     DVT prophylaxis: Eliquis  Code Status:   Code Status: Full Code Family Communication: plan of care discussed with patient at bedside. Patient status is: Remains hospitalized because of severity of illness Level of care: Telemetry   Dispo: The patient is from: home            Anticipated disposition: TBD Objective: Vitals last 24 hrs: Vitals:   05/14/24 0759 05/14/24 0800 05/14/24 0830 05/14/24 0900  BP: 116/76 116/76    Pulse: 80 76 81 60  Resp: 18 20 (!) 23 (!) 31  Temp: 98.2 F (36.8 C)     TempSrc: Oral     SpO2: 96% 98% 96% 95%  Weight:      Height:        Physical Examination: General exam: aaox3, dyspneic, on oxygen  HEENT:Oral mucosa moist, Ear/Nose WNL grossly Respiratory system: Bilaterally diminished BS,no use of accessory muscle Cardiovascular system: S1 & S2 +, No JVD. Gastrointestinal system: Abdomen soft, obese,ND, BS+ Nervous System: Alert, awake, moving all extremities,and following commands. Extremities: extremities warm, leg edema ++ Skin: Warm, no rashes MSK: Normal muscle bulk,tone, power   Medications reviewed:  Scheduled Meds:  apixaban   5 mg Oral BID   empagliflozin  10 mg Oral Daily   ezetimibe   10 mg Oral QHS   And   simvastatin   40 mg Oral QHS   furosemide   80  mg Intravenous BID   hydrALAZINE  100 mg Oral Q8H   losartan  100 mg Oral Daily   metoprolol succinate  50 mg Oral Daily   pantoprazole   40 mg Oral Daily   potassium chloride   40 mEq Oral BID   sodium chloride  flush  3 mL Intravenous Once   sodium chloride  flush  3 mL Intravenous Q12H   spironolactone  25 mg Oral Daily   Continuous Infusions: Diet: Diet Order             Diet heart healthy/carb modified Room service appropriate? Yes; Fluid consistency: Thin  Diet effective now                    Data Reviewed: I have personally reviewed following labs and imaging studies ( see epic result tab) CBC: Recent Labs  Lab 05/11/24 2026 05/13/24 0238 05/14/24 0416  WBC 7.1 7.4 9.3  NEUTROABS 4.8  --  6.9  HGB 8.9* 9.2* 9.3*  HCT 28.9* 30.9* 30.3*  MCV 84.3 86.1 84.2  PLT 216 204 226   CMP: Recent Labs  Lab 05/11/24 2026 05/12/24 1317 05/13/24 0238  05/14/24 0416  NA 144 142 142 141  K 3.5 3.5 4.0 3.9  CL 106 100 101 100  CO2 27 26 29 30   GLUCOSE 127* 96 111* 104*  BUN 20 16 19  24*  CREATININE 1.42* 1.27* 1.60* 1.58*  CALCIUM 9.1 8.5* 8.6* 8.8*   GFR: Estimated Creatinine Clearance: 58 mL/min (A) (by C-G formula based on SCr of 1.58 mg/dL (H)). Recent Labs  Lab 05/11/24 2026  AST 24  ALT 19  ALKPHOS 94  BILITOT 0.4  PROT 6.6  ALBUMIN 3.7   No results for input(s): LIPASE, AMYLASE in the last 168 hours. No results for input(s): AMMONIA in the last 168 hours. Coagulation Profile: No results for input(s): INR, PROTIME in the last 168 hours. Unresulted Labs (From admission, onward)     Start     Ordered   05/14/24 0925  TSH  Add-on,   AD        05/14/24 0924   05/14/24 0500  Basic metabolic panel with GFR  Daily,   R      05/13/24 1203   05/14/24 0500  CBC with Differential/Platelet  Daily,   R      05/13/24 1203           Antimicrobials/Microbiology: Anti-infectives (From admission, onward)    None         Component Value Date/Time    SDES  12/05/2023 2229    URINE, RANDOM Performed at [facility], [address] [place], [place] [address]    [facility]  12/05/2023 2229    NONE Reflexed from [id number] Performed at [facility], [address], [place], [place] [address]    CULT >=100,000 COLONIES/mL ESCHERICHIA COLI (A) 12/05/2023 2229   REPTSTATUS 12/08/2023 FINAL 12/05/2023 2229    Procedures:    Mennie LAMY, MD [facility] 05/14/2024, 10:40 AM

## 2024-05-14 NOTE — Evaluation (Signed)
 Physical Therapy Evaluation Patient Details Name: Joan [name] MRN: [id number] DOB: August 19, 1955 Today's Date: 05/14/2024  History of Present Illness  68 y.o. female presents with SOB for 3 weeks and chest pain, fluid retention, with medical history significant of hypertension, diastolic congestive heart failure, diabetes mellitus type 2, history of DVT, morbid obesity, OSA presents with shortness of breath and fluid buildup.  Clinical Impression  Pt admitted with/for the problem as stated above, pt needing minimal assist overall.    Pt currently limited functionally due to the problems listed. ( See problems list.)   Pt will benefit from PT to maximize function and safety in order to get ready for next venue listed below.  Patient will benefit from continued inpatient follow up therapy, <3 hours/day, but if declines would need HHPT, bari RW  and 3 in1.          If plan is discharge home, recommend the following: A little help with bathing/dressing/bathroom;Assistance with cooking/housework;Help with stairs or ramp for entrance   Can travel by private vehicle        Equipment Recommendations  (Bariatric RW)  Recommendations for Other Services       Functional Status Assessment Patient has had a recent decline in their functional status and demonstrates the ability to make significant improvements in function in a reasonable and predictable amount of time.     Precautions / Restrictions Precautions Precautions: Fall Restrictions Weight Bearing Restrictions Per Provider Order: No      Mobility  Bed Mobility Overal bed mobility: Needs Assistance Bed Mobility: Supine to Sit     Supine to sit: HOB elevated, Used rails, Contact guard, Min assist     General bed mobility comments: moves well with bed mobility HOB up 45 degrees,  min A for BLEs.    Transfers Overall transfer level: Needs assistance Equipment used: Rolling walker (2 wheels) Transfers: Sit to/from  Stand Sit to Stand: Min assist           General transfer comment: Held the RW down, but pt did not pull on it.  Appropriate use of the RW. pt able to stand and pull up her incontinence undergarment.    Ambulation/Gait Ambulation/Gait assistance: Contact guard assist Gait Distance (Feet): 220 Feet (in multiple increments,  80 feet initially then several 30 to 40 foot bouts with standing rest on elbows in between) Assistive device: Rolling walker (2 wheels) Gait Pattern/deviations: Step-through pattern, Decreased step length - right, Decreased step length - left, Decreased stride length   Gait velocity interpretation: <1.8 ft/sec, indicate of risk for recurrent falls   General Gait Details: mildly unsteady with mildly labored steps due to  bil LE lyphadema, but safe.  Resting on elbows, but not walking propped on elbows.  VSS with HR in the 90's .  Stairs            Wheelchair Mobility     Tilt Bed    Modified Rankin (Stroke Patients Only)       Balance Overall balance assessment: Needs assistance Sitting-balance support: No upper extremity supported, Feet supported Sitting balance-Leahy Scale: Good     Standing balance support: Bilateral upper extremity supported, During functional activity, Reliant on assistive device for balance Standing balance-Leahy Scale: Poor                               Pertinent Vitals/Pain      Home Living Family/patient expects to  be discharged to:: Private residence Living Arrangements: Spouse/significant other Available Help at Discharge: Family;Available 24 hours/day Type of Home: Apartment Home Access: Level entry       Home Layout: One level Home Equipment: Tub bench;Hand held shower head;Wheelchair - Surveyor, Quantity (2 wheels) (need bari walker and BSC) Additional Comments: Pt lives with significant other, has old RW but needs new Bari RW and [facility]    Prior Function Prior Level of Function : Needs  assist             Mobility Comments: power w/c in mornings or ADLs for rest breaks, RW for short distances ADLs Comments: significant other helps with socks/shoes, uses diapers, does cooking uses w/c for transporting items     Extremity/Trunk Assessment   Upper Extremity Assessment Upper Extremity Assessment: Overall WFL for tasks assessed    Lower Extremity Assessment Lower Extremity Assessment: Overall WFL for tasks assessed       Communication   Communication Communication: No apparent difficulties    Cognition Arousal: Alert Behavior During Therapy: WFL for tasks assessed/performed                             Following commands: Intact       Cueing Cueing Techniques: Verbal cues     General Comments      Exercises     Assessment/Plan    PT Assessment Patient needs continued PT services  PT Problem List Decreased activity tolerance;Decreased balance;Decreased mobility;Cardiopulmonary status limiting activity       PT Treatment Interventions DME instruction;Gait training;Functional mobility training;Therapeutic activities;Patient/family education    PT Goals (Current goals can be found in the Care Plan section)  Acute Rehab PT Goals Patient Stated Goal: HOME, pool therapy, find help to get a power w/c transport for the car. PT Goal Formulation: With patient Time For Goal Achievement: 05/28/24    Frequency Min 3X/week     Co-evaluation               AM-PAC PT 6 Clicks Mobility  Outcome Measure Help needed turning from your back to your side while in a flat bed without using bedrails?: A Little Help needed moving from lying on your back to sitting on the side of a flat bed without using bedrails?: A Little Help needed moving to and from a bed to a chair (including a wheelchair)?: A Little Help needed standing up from a chair using your arms (e.g., wheelchair or bedside chair)?: A Little Help needed to walk in hospital room?: A  Little Help needed climbing 3-5 steps with a railing? : A Lot 6 Click Score: 17    End of Session Equipment Utilized During Treatment: Oxygen Activity Tolerance: Patient tolerated treatment well;Patient limited by fatigue Patient left: in bed;with call bell/phone within reach;Other (comment) (sitting EOB) Nurse Communication: Mobility status PT Visit Diagnosis: Difficulty in walking, not elsewhere classified (R26.2);Other abnormalities of gait and mobility (R26.89)    Time: 8765-8684 PT Time Calculation (min) (ACUTE ONLY): 41 min   Charges:   PT Evaluation $PT Eval Moderate Complexity: 1 Mod PT Treatments $Gait Training: 8-22 mins $Therapeutic Activity: 8-22 mins PT General Charges $$ ACUTE PT VISIT: 1 Visit         05/14/2024  India HERO., PT Acute Rehabilitation Services [phone]  (office)  Vinie GAILS Kashia Brossard 05/14/2024, 3:22 PM

## 2024-05-14 NOTE — TOC Progression Note (Signed)
 Transition of Care [facility]) - Progression Note    Patient Details  Name: Joan [name] MRN: [id number] Date of Birth: 06-Jan-1956  [facility] CM/SW Contact  Graves-Bigelow, Erminio Deems, RN Phone Number: 05/14/2024, 3:49 PM  Clinical Narrative:  ICM spoke with patient regarding DME- bariatric rolling walker and bedside commode. Patient uses [employer] for DME- referral submitted for both and the office will deliver DME to the room prior to transition home. Inpatient Case Manager will continue to follow for additional needs as the patient progresses.    Expected Discharge Plan: Home/Self Care Barriers to Discharge: Continued Medical Work up  Expected Discharge Plan and Services   Discharge Planning Services: CM Consult Post Acute Care Choice: Skilled Nursing Facility Living arrangements for the past 2 months: Apartment                 DME Arranged: Bedside commode, Walker rolling (Both bariatric)   Date DME Agency Contacted: 05/14/24 Time DME Agency Contacted: [phone] Representative spoke with at DME Agency: Zack   Social Drivers of Health (SDOH) Interventions SDOH Screenings   Food Insecurity: No Food Insecurity (05/14/2024)  Housing: Low Risk  (05/14/2024)  Transportation Needs: No Transportation Needs (05/13/2024)  Utilities: Not At Risk (05/14/2024)  Alcohol Screen: Low Risk  (05/13/2024)  Depression (PHQ2-9): Low Risk  (11/29/2020)  Financial Resource Strain: Low Risk  (05/13/2024)  Social Connections: Unknown (05/14/2024)  Tobacco Use: Low Risk  (05/14/2024)   Readmission Risk Interventions     No data to display

## 2024-05-15 DIAGNOSIS — R7989 Other specified abnormal findings of blood chemistry: Secondary | ICD-10-CM | POA: Diagnosis not present

## 2024-05-15 DIAGNOSIS — I5033 Acute on chronic diastolic (congestive) heart failure: Secondary | ICD-10-CM | POA: Diagnosis not present

## 2024-05-15 DIAGNOSIS — I10 Essential (primary) hypertension: Secondary | ICD-10-CM | POA: Diagnosis not present

## 2024-05-15 DIAGNOSIS — R000 Tachycardia, unspecified: Secondary | ICD-10-CM | POA: Diagnosis not present

## 2024-05-15 DIAGNOSIS — I5031 Acute diastolic (congestive) heart failure: Secondary | ICD-10-CM | POA: Diagnosis not present

## 2024-05-15 LAB — CBC WITH DIFFERENTIAL/PLATELET
Abs Immature Granulocytes: 0.02 K/uL (ref 0.00–0.07)
Basophils Absolute: 0 K/uL (ref 0.0–0.1)
Basophils Relative: 1 %
Eosinophils Absolute: 0.2 K/uL (ref 0.0–0.5)
Eosinophils Relative: 3 %
HCT: 31 % — ABNORMAL LOW (ref 36.0–46.0)
Hemoglobin: 9.5 g/dL — ABNORMAL LOW (ref 12.0–15.0)
Immature Granulocytes: 0 %
Lymphocytes Relative: 21 %
Lymphs Abs: 1.4 K/uL (ref 0.7–4.0)
MCH: 25.5 pg — ABNORMAL LOW (ref 26.0–34.0)
MCHC: 30.6 g/dL (ref 30.0–36.0)
MCV: 83.3 fL (ref 80.0–100.0)
Monocytes Absolute: 0.7 K/uL (ref 0.1–1.0)
Monocytes Relative: 10 %
Neutro Abs: 4.6 K/uL (ref 1.7–7.7)
Neutrophils Relative %: 65 %
Platelets: 232 K/uL (ref 150–400)
RBC: 3.72 MIL/uL — ABNORMAL LOW (ref 3.87–5.11)
RDW: 14.8 % (ref 11.5–15.5)
WBC: 6.9 K/uL (ref 4.0–10.5)
nRBC: 0 % (ref 0.0–0.2)

## 2024-05-15 LAB — BASIC METABOLIC PANEL WITH GFR
Anion gap: 13 (ref 5–15)
BUN: 24 mg/dL — ABNORMAL HIGH (ref 8–23)
CO2: 30 mmol/L (ref 22–32)
Calcium: 8.9 mg/dL (ref 8.9–10.3)
Chloride: 98 mmol/L (ref 98–111)
Creatinine, Ser: 1.6 mg/dL — ABNORMAL HIGH (ref 0.44–1.00)
GFR, Estimated: 35 mL/min — ABNORMAL LOW (ref >60)
Glucose, Bld: 101 mg/dL — ABNORMAL HIGH (ref 70–99)
Potassium: 4.1 mmol/L (ref 3.5–5.1)
Sodium: 141 mmol/L (ref 135–145)

## 2024-05-15 MED ORDER — METHOCARBAMOL 500 MG PO TABS
750.0000 mg | ORAL_TABLET | Freq: Two times a day (BID) | ORAL | Status: DC | PRN
  Filled 2024-05-15: qty 2

## 2024-05-15 MED ORDER — INTEGRA F 125-1 MG PO CAPS: 1.0000 | ORAL_CAPSULE | Freq: Every day | ORAL | Status: DC

## 2024-05-15 MED ORDER — FE FUM-VIT C-VIT B12-FA 460-60-0.01-1 MG PO CAPS
1.0000 | ORAL_CAPSULE | ORAL | Status: DC
  Administered 2024-05-15: 1 via ORAL
  Filled 2024-05-15: qty 1

## 2024-05-15 NOTE — Progress Notes (Signed)
   05/15/24 2349  BiPAP/CPAP/SIPAP  BiPAP/CPAP/SIPAP Pt Type Adult  BiPAP/CPAP/SIPAP Resmed  Mask Type Full face mask  Mask Size Medium  PEEP 9 cmH20  FiO2 (%) 28 %  Flow Rate 2 lpm  Patient Home Machine No  Patient Home Mask No  Patient Home Tubing No  BiPAP/CPAP /SiPAP Vitals  Pulse Rate 76  Resp (!) 23  SpO2 93 %  Bilateral Breath Sounds Diminished

## 2024-05-15 NOTE — Progress Notes (Signed)
 PROGRESS NOTE Joan [name]  FMW:[id number] DOB: 1956/02/16 DOA: 05/11/2024 PCP: Pcp, No  Brief Narrative/Hospital Course: Joan [name] is a 68 y.o. female with PMH of hypertension, diastolic congestive heart failure, diabetes mellitus type 2, history of DVT, morbid obesity, OSA presents with shortness of breath x  3 weeks and on 10/28 night, she had mild chest pain located in the upper chest area, which she thinks might be related to reflux. She is not on oxygen therapy. She has been dealing with fluid retention that has persisted despite taking 40 mg of Lasix  for seven days.She describes issues with her lymphatic system, noting that the fluid is not moving. She is not currently on oxygen but has been using a CPAP machine, which broke back in February. Despite efforts, she has not received a replacement CPAP machine. She receives Jelsyn three shots every six months for an unspecified condition, as she does not take cortisone. In the ZI:jqzampoz with pulse 73-1 41, respirations 17-39, blood pressures elevated up to 179/115, and O2 saturations currently maintained on 4 L nasal cannula oxygen.  Labs significant for BNP 4971, high-sensitivity troponins 43 ->43, hemoglobin 8.9, BUN 20, and creatinine 1.42.  Chest x-ray showed no acute abnormality CT angiogram of the chest noted no pulmonary embolism with enlarged main pulmonary artery measuring 3.3 cm and cardiomegaly. 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 TTE-EF 45-50% mildly decreased function no RWMA G1DD- Cardiology on board input appreciated  Cont IV diuresis,GDMT- w/ Toprol (decreased dose due to bradycardia), losartan, Aldactone and hydralazine. Having good output with decreasing overall net negative balance as below Cont to monitor daily I/O,weight, electrolytes.Keep on  salt/fluid restricted diet and monitor in tele. Net IO Since Admission: -9,117 mL [05/15/24 0939]  Filed Weights   05/13/24 0401 05/14/24 0355 05/15/24 0500  Weight: (!) 158.9 kg (!) 173.2 kg (!) 162 kg    Recent Labs  Lab 05/11/24 2026 05/12/24 1317 05/13/24 0238 05/14/24 0416 05/15/24 0420  PROBNP 4,971.0*  --   --   --   --   BUN 20 16 19  24* 24*  CREATININE 1.42* 1.27* 1.60* 1.58* 1.60*  K 3.5 3.5 4.0 3.9 4.1   Essential hypertension: Controlled on GDMT as above   Elevated troponin: Rather flat -likely  demand ischemia due to CHF  SVT versus atrial tachycardia with episode of bradycardia during sleep: Atrial tachycardia suspected, also bradycardic during sleep likely from sleep apnea.   Given her bradycardia -decreased metoprolol per cardio .Encouraged use of CPAP. Continue to monitor on telemetry   AKI on CKD3b: Creat1.42 on admission.  B/Lpreviously ~1.2.Creatinine currently holding in 1.6 range.  Monitor while on diuresis, avoid hypotension nephrotoxic medication Recent Labs    12/05/23 2221 05/11/24 2026 05/12/24 1317 05/13/24 0238 05/14/24 0416 05/15/24 0420  BUN 31* 20 16 19  24* 24*  CREATININE 1.25* 1.42* 1.27* 1.60* 1.58* 1.60*  CO2 25 27 26 29 30 30   K 4.0 3.5 3.5 4.0 3.9 4.1   Normocytic anemia: Monitor hb-baseline ~  8 to 9 g, suspect in the setting of CKD.monitor. resume iron. Recent Labs  Lab 05/11/24 2026 05/13/24 0238 05/14/24 0416 05/15/24 0420  HGB 8.9* 9.2* 9.3* 9.5*  HCT 28.9* 30.9* 30.3* 31.0*    History of DVT/PE on chronic anticoagulation since 01/2020: Continue  Eliquis    Prolonged QT interval 538. Avoid QT prolonging medications. Correct any electrolyte abnormalities   T2DM without long-term use of insulin: Last available hemoglobin A1c noted to be 5.3 when checked 7 months ago.Cont Jardiance.  Blood sugar 101-127 on lab   Hyperlipidemia Continue ezetimibe -simvastatin    OSA Patient had not recently been on CPAP after it broke earlier this year in Feb,she has been trying to get a replacement, but has not been able to yet. Ordered CPAP nightly here   GERD Continue omeprazole  Morbid Obesity w/ Body mass index is 57.64 kg/m.: Will benefit with PCP follow-up, weight loss,healthy lifestyle and outpatient Mounjaro  Mobility: PT Orders: Active  PT Follow up Rec: Other (Comment) (Pt Would Love Her Therapy To Be In The Pool,   [address]wher On [address]05/14/2024 1326    DVT prophylaxis: Eliquis  Code Status:   Code Status: Full Code Family Communication: plan of care discussed with patient at bedside. Patient status is: Remains hospitalized because of severity of illness Level of care: Telemetry   Dispo: The patient is from: home            Anticipated disposition: TBD Objective: Vitals last 24 hrs: Vitals:   05/15/24 0357 05/15/24 0500 05/15/24 0617 05/15/24 0833  BP: 130/76  130/76 126/74  Pulse: 76   73  Resp: 20   15  Temp: 97.7 F (36.5 C)   98.3 F (36.8 C)  TempSrc: Oral   Axillary  SpO2: 99%   97%  Weight:  (!) 162 kg    Height:        Physical Examination: General exam:  AAOX3, Obese HEENT:Oral mucosa moist, Ear/Nose WNL grossly Respiratory system: Bilaterally diminished at basese, no use of accessory muscle Cardiovascular system: S1 & S2 +, No JVD. Gastrointestinal system: Abdomen soft, obese,ND, BS+ Nervous System: Alert, awake, moving all extremities,and following commands. Extremities: extremities warm, leg edema ++ Skin: Warm, no rashes MSK: Normal muscle bulk,tone, power   Medications reviewed:   Scheduled Meds:  apixaban   5 mg Oral BID   empagliflozin  10 mg Oral Daily   ezetimibe   10 mg Oral QHS   And   simvastatin   40 mg Oral QHS   furosemide   80 mg Intravenous BID   hydrALAZINE  100 mg Oral Q8H   losartan  100 mg Oral Daily   metoprolol succinate  50 mg Oral Daily   pantoprazole   40 mg Oral Daily   potassium chloride   40 mEq Oral BID   sodium chloride  flush  3 mL Intravenous Once   sodium chloride  flush  3 mL Intravenous Q12H   spironolactone  25 mg Oral Daily   Continuous Infusions: Diet: Diet Order             Diet heart healthy/carb modified Room service appropriate? Yes; Fluid consistency: Thin  Diet effective now                    Data Reviewed: I have personally reviewed following labs and imaging studies ( see epic result tab) CBC: Recent Labs  Lab 05/11/24 2026 05/13/24 0238 05/14/24 0416 05/15/24 0420  WBC 7.1 7.4 9.3 6.9  NEUTROABS 4.8  --  6.9  4.6  HGB 8.9* 9.2* 9.3* 9.5*  HCT 28.9* 30.9* 30.3* 31.0*  MCV 84.3 86.1 84.2 83.3  PLT 216 204 226 232   CMP: Recent Labs  Lab 05/11/24 2026 05/12/24 1317 05/13/24 0238 05/14/24 0416 05/15/24 0420  NA 144 142 142 141 141  K 3.5 3.5 4.0 3.9 4.1  CL 106 100 101 100 98  CO2 27 26 29 30 30   GLUCOSE 127* 96 111* 104* 101*  BUN 20 16 19  24* 24*  CREATININE 1.42* 1.27* 1.60* 1.58* 1.60*  CALCIUM 9.1 8.5* 8.6* 8.8* 8.9   GFR: Estimated Creatinine Clearance: 54.8 mL/min (A) (by C-G formula based on SCr of 1.6 mg/dL (H)). Recent Labs  Lab 05/11/24 2026  AST 24  ALT 19  ALKPHOS 94  BILITOT 0.4  PROT 6.6  ALBUMIN 3.7   No results for input(s): LIPASE, AMYLASE in the last 168 hours. No results for input(s): AMMONIA in the last 168 hours. Coagulation Profile: No results for input(s): INR, PROTIME in the last 168 hours. Unresulted Labs (From admission, onward)     Start     Ordered   05/14/24 0500  Basic metabolic panel with GFR  Daily,   R      05/13/24 1203   05/14/24 0500  CBC  with Differential/Platelet  Daily,   R      05/13/24 1203           Antimicrobials/Microbiology: Anti-infectives (From admission, onward)    None         Component Value Date/Time   SDES  12/05/2023 2229    URINE, RANDOM Performed at [facility], [address], [place], [place] [address]    [facility]  12/05/2023 2229    NONE Reflexed from [id number] Performed at [facility], [address] [place], [place] [address]    CULT >=100,000 COLONIES/mL ESCHERICHIA COLI (A) 12/05/2023 2229   REPTSTATUS 12/08/2023 FINAL 12/05/2023 2229    Procedures:    Mennie LAMY, MD [facility] 05/15/2024, 11:25 AM

## 2024-05-15 NOTE — Progress Notes (Signed)
  Progress Note  Patient Name: Joan [name] Date of Encounter: 05/15/2024 [facility] Cardiologist: Jennifer JONELLE Crape, MD   Interval Summary   Continues to have some mild shortness of breath.  Slept better overnight after using CPAP.  Edema persists.  Does have some odd feelings in her chest that she attributes to volume overload.  Vital Signs Vitals:   05/15/24 0357 05/15/24 0500 05/15/24 0617 05/15/24 0833  BP: 130/76  130/76 126/74  Pulse: 76   73  Resp: 20   15  Temp: 97.7 F (36.5 C)   98.3 F (36.8 C)  TempSrc: Oral   Axillary  SpO2: 99%   97%  Weight:  (!) 162 kg    Height:        Intake/Output Summary (Last 24 hours) at 05/15/2024 0940 Last data filed at 05/15/2024 0358 Gross per 24 hour  Intake 880 ml  Output 4000 ml  Net -3120 ml      05/15/2024    5:00 AM 05/14/2024    3:55 AM 05/13/2024    4:01 AM  Last 3 Weights  Weight (lbs) 357 lb 2.3 oz 381 lb 13.4 oz 350 lb 5 oz  Weight (kg) 162 kg 173.2 kg 158.9 kg      Telemetry/ECG  Sinus rhythm with intermittent SVT- Personally Reviewed  Physical Exam  GEN: No acute distress.   Neck: No JVD Cardiac: RRR, no murmurs, rubs, or gallops.  Respiratory: Clear to auscultation bilaterally. GI: Soft, nontender, non-distended  MS: 1-2+ edema  Assessment & Plan  1.  Acute on chronic diastolic heart failure: Ejection fraction 45 to 50%.  Volume status difficult delineate due to morbid obesity.  Creatinine has remained stable.  Jurgen Groeneveld continue with current diuresis.  2.  Hypertension: Well-controlled.  Continue metoprolol, Aldactone, losartan.  3.  Elevated troponin: Likely related to demand ischemia.  4.  SVT: Has had sinus pauses.  Metoprolol reduced.  Sinus pauses likely related to untreated sleep apnea.  Did better overnight.  5.  Morbid obesity and likely untreated sleep apnea: Kaylianna Detert need sleep study at discharge  5.  Moderate aortic stenosis: Outpatient follow-up   For questions or updates,  please contact [facility] Please consult [email or web address] for contact info under         Signed, Longino Trefz Gladis Norton, MD

## 2024-05-16 DIAGNOSIS — R000 Tachycardia, unspecified: Secondary | ICD-10-CM | POA: Diagnosis not present

## 2024-05-16 DIAGNOSIS — I10 Essential (primary) hypertension: Secondary | ICD-10-CM | POA: Diagnosis not present

## 2024-05-16 DIAGNOSIS — R7989 Other specified abnormal findings of blood chemistry: Secondary | ICD-10-CM | POA: Diagnosis not present

## 2024-05-16 DIAGNOSIS — I5031 Acute diastolic (congestive) heart failure: Secondary | ICD-10-CM | POA: Diagnosis not present

## 2024-05-16 DIAGNOSIS — I5033 Acute on chronic diastolic (congestive) heart failure: Secondary | ICD-10-CM | POA: Diagnosis not present

## 2024-05-16 LAB — CBC WITH DIFFERENTIAL/PLATELET
Abs Immature Granulocytes: 0.02 K/uL (ref 0.00–0.07)
Basophils Absolute: 0 K/uL (ref 0.0–0.1)
Basophils Relative: 0 %
Eosinophils Absolute: 0.1 K/uL (ref 0.0–0.5)
Eosinophils Relative: 2 %
HCT: 31.2 % — ABNORMAL LOW (ref 36.0–46.0)
Hemoglobin: 9.5 g/dL — ABNORMAL LOW (ref 12.0–15.0)
Immature Granulocytes: 0 %
Lymphocytes Relative: 21 %
Lymphs Abs: 1.4 K/uL (ref 0.7–4.0)
MCH: 25.7 pg — ABNORMAL LOW (ref 26.0–34.0)
MCHC: 30.4 g/dL (ref 30.0–36.0)
MCV: 84.6 fL (ref 80.0–100.0)
Monocytes Absolute: 0.8 K/uL (ref 0.1–1.0)
Monocytes Relative: 12 %
Neutro Abs: 4.3 K/uL (ref 1.7–7.7)
Neutrophils Relative %: 65 %
Platelets: 235 K/uL (ref 150–400)
RBC: 3.69 MIL/uL — ABNORMAL LOW (ref 3.87–5.11)
RDW: 14.7 % (ref 11.5–15.5)
WBC: 6.7 K/uL (ref 4.0–10.5)
nRBC: 0 % (ref 0.0–0.2)

## 2024-05-16 LAB — BASIC METABOLIC PANEL WITH GFR
Anion gap: 11 (ref 5–15)
BUN: 32 mg/dL — ABNORMAL HIGH (ref 8–23)
CO2: 30 mmol/L (ref 22–32)
Calcium: 8.8 mg/dL — ABNORMAL LOW (ref 8.9–10.3)
Chloride: 97 mmol/L — ABNORMAL LOW (ref 98–111)
Creatinine, Ser: 2.33 mg/dL — ABNORMAL HIGH (ref 0.44–1.00)
GFR, Estimated: 23 mL/min — ABNORMAL LOW (ref >60)
Glucose, Bld: 120 mg/dL — ABNORMAL HIGH (ref 70–99)
Potassium: 3.9 mmol/L (ref 3.5–5.1)
Sodium: 138 mmol/L (ref 135–145)

## 2024-05-16 NOTE — Progress Notes (Signed)
 Read and elevated today.  Joan [name] hold diuresis.  Soyla Norton, MD

## 2024-05-16 NOTE — Progress Notes (Signed)
 PROGRESS NOTE Joan [name]  FMW:[id number] DOB: Apr 04, 1956 DOA: 05/11/2024 PCP: Pcp, No  Brief Narrative/Hospital Course: Joan [name] is a 68 y.o. female with PMH of hypertension, diastolic congestive heart failure, diabetes mellitus type 2, history of DVT, morbid obesity, OSA presents with shortness of breath x  3 weeks and on 10/28 night, she had mild chest pain located in the upper chest area, which she thinks might be related to reflux. She is not on oxygen therapy. She has been dealing with fluid retention that has persisted despite taking 40 mg of Lasix  for seven days.She describes issues with her lymphatic system, noting that the fluid is not moving. She is not currently on oxygen but has been using a CPAP machine, which broke back in February. Despite efforts, she has not received a replacement CPAP machine. She receives Jelsyn three shots every six months for an unspecified condition, as she does not take cortisone. In the ZI:jqzampoz with pulse 73-1 41, respirations 17-39, blood pressures elevated up to 179/115, and O2 saturations currently maintained on 4 L nasal cannula oxygen.  Labs significant for BNP 4971, high-sensitivity troponins 43 ->43, hemoglobin 8.9, BUN 20, and creatinine 1.42.  Chest x-ray showed no acute abnormality CT angiogram of the chest noted no pulmonary embolism with enlarged main pulmonary artery measuring 3.3 cm and cardiomegaly. Patient admitted cardiology consulted and managed with aggressive diuresis/GDMT  Subjective: Seen and examined Complains of being cold. BP had been soft 90s-96 systolic this morning, used CPAP last night Urine output further up 4000cc> 2450 cc weight further down from 395 lb> 387>350>381lb??> 357> pending. 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 TTE-EF 45-50% mildly decreased function no RWMA G1DD- Cardiology on board input appreciated  Cont IV diuresis,GDMT- w/ Toprol (decreased dose due to bradycardia), losartan, Aldactone and hydralazine-but she had soft blood pressure Cardio to adjust meds as BP soft Labs pending-she refused this morning> subsequently resulted creatinine at 2.3 with BP being soft losartan and Toprol held. Lasix  stopped Having good output with decreasing overall net negative balance Net IO Since Admission: -10,597 mL [05/16/24 1145] Cont to monitor daily I/O,weight, electrolytes.Keep on  salt/fluid restricted diet and monitor in tele.  Filed Weights   05/13/24 0401 05/14/24 0355 05/15/24 0500  Weight: (!) 158.9 kg (!) 173.2 kg (!) 162 kg    Recent Labs  Lab 05/11/24 2026 05/12/24 1317 05/13/24 0238 05/14/24 0416 05/15/24 0420 05/16/24 0943  PROBNP 4,971.0*  --   --   --   --   --   BUN 20 16 19  24* 24* 32*  CREATININE 1.42* 1.27* 1.60* 1.58* 1.60* 2.33*  K 3.5 3.5 4.0 3.9 4.1 3.9   Essential hypertension: On GDMT as above, BP somewhat soft this morning-   Elevated troponin: Rather flat -likely  demand ischemia due to CHF  SVT versus atrial tachycardia with episode of bradycardia during sleep: Atrial tachycardia suspected, also bradycardic during sleep likely from sleep apnea. Given her bradycardia- decreased metoprolol per cardio .Encouraged use of CPAP. Continue to monitor on telemetry   AKI on CKD3b: Creat1.42 on admission.  B/Lpreviously ~1.2-labs with creatinine uptrending this morning adjust GDMT diuretics per cardiology eval.   Recent Labs    12/05/23 2221 05/11/24 2026 05/12/24 1317 05/13/24 0238 05/14/24 0416 05/15/24 0420 05/16/24  0943  BUN 31* 20 16 19  24* 24* 32*  CREATININE 1.25* 1.42* 1.27* 1.60* 1.58* 1.60* 2.33*  CO2 25 27 26 29 30 30 30   K 4.0 3.5 3.5 4.0 3.9 4.1 3.9   Normocytic  anemia: Monitor hb-baseline ~8 to 9 g, suspect in the setting of CKD.monitor. resume iron. Recent Labs  Lab 05/11/24 2026 05/13/24 0238 05/14/24 0416 05/15/24 0420 05/16/24 0943  HGB 8.9* 9.2* 9.3* 9.5* 9.5*  HCT 28.9* 30.9* 30.3* 31.0* 31.2*    History of DVT/PE on chronic anticoagulation since 01/2020: Continue Eliquis    Prolonged QT interval 538. Avoid QT prolonging medications. Correct any electrolyte abnormalities   T2DM without long-term use of insulin: Last available hemoglobin A1c noted to be 5.3 when checked 7 months ago.Cont Jardiance.  Blood sugar 101-127 on lab   Hyperlipidemia Continue ezetimibe -simvastatin    OSA Patient had not recently been on CPAP after it broke earlier this year in Feb,she has been trying to get a replacement, but has not been able to yet. Ordered CPAP nightly here   GERD Continue omeprazole  Morbid Obesity w/ Body mass index is 57.64 kg/m.: Will benefit with PCP follow-up, weight loss,healthy lifestyle and outpatient Mounjaro  Mobility: PT Orders: Active  PT Follow up Rec: Other (Comment) (Pt Would Love Her Therapy To Be In The Pool,   [place].)05/14/2024 1326    DVT prophylaxis: Eliquis  Code Status:   Code Status: Full Code Family Communication: plan of care discussed with patient at bedside. Patient status is: Remains hospitalized because of severity of illness Level of care: Telemetry   Dispo: The patient is from: home            Anticipated disposition: TBD Objective: Vitals last 24 hrs: Vitals:   05/16/24 0425 05/16/24 0633 05/16/24 0920 05/16/24 0922  BP:  106/61 (!) 90/53 96/62  Pulse:   70 79  Resp: 20  16 (!) 28  Temp: 99.2 F (37.3 C)     TempSrc: Axillary     SpO2:   95% 96%  Weight:      Height:        Physical Examination: General exam:  AAOX3, pleasant, NAD, morbidly obese HEENT:Oral mucosa moist, Ear/Nose WNL grossly Respiratory system: Bilaterally diminished at basese, no use of  accessory muscle Cardiovascular system: S1 & S2 +, No JVD. Gastrointestinal system: Abdomen soft, obese,ND, BS+ Nervous System: Alert, awake, moving all extremities,and following commands. Extremities: extremities warm, leg edema ++ Skin: Warm, no rashes MSK: Normal muscle bulk,tone, power   Medications reviewed:  Scheduled Meds:  apixaban   5 mg Oral BID   empagliflozin  10 mg Oral Daily   ezetimibe   10 mg Oral QHS   And   simvastatin   40 mg Oral QHS   Fe Fum-Vit C-Vit B12-FA  1 capsule Oral Weekly   hydrALAZINE  100 mg Oral Q8H   losartan  100 mg Oral Daily   metoprolol succinate  50 mg Oral Daily   pantoprazole   40 mg Oral Daily   potassium chloride   40 mEq Oral BID   sodium chloride  flush  3 mL Intravenous Once   sodium chloride  flush  3 mL Intravenous Q12H   spironolactone  25 mg Oral Daily   Continuous Infusions: Diet: Diet Order             Diet heart healthy/carb modified Room service appropriate? Yes; Fluid consistency: Thin  Diet effective now  Data Reviewed: I have personally reviewed following labs and imaging studies ( see epic result tab) CBC: Recent Labs  Lab 05/11/24 2026 05/13/24 0238 05/14/24 0416 05/15/24 0420 05/16/24 0943  WBC 7.1 7.4 9.3 6.9 6.7  NEUTROABS 4.8  --  6.9 4.6 4.3  HGB 8.9* 9.2* 9.3* 9.5* 9.5*  HCT 28.9* 30.9* 30.3* 31.0* 31.2*  MCV 84.3 86.1 84.2 83.3 84.6  PLT 216 204 226 232 235   CMP: Recent Labs  Lab 05/12/24 1317 05/13/24 0238 05/14/24 0416 05/15/24 0420 05/16/24 0943  NA 142 142 141 141 138  K 3.5 4.0 3.9 4.1 3.9  CL 100 101 100 98 97*  CO2 26 29 30 30 30   GLUCOSE 96 111* 104* 101* 120*  BUN 16 19 24* 24* 32*  CREATININE 1.27* 1.60* 1.58* 1.60* 2.33*  CALCIUM 8.5* 8.6* 8.8* 8.9 8.8*   GFR: Estimated Creatinine Clearance: 37.6 mL/min (A) (by C-G formula based on SCr of 2.33 mg/dL (H)). Recent Labs  Lab 05/11/24 2026  AST 24  ALT 19  ALKPHOS 94  BILITOT 0.4  PROT 6.6  ALBUMIN 3.7    No results for input(s): LIPASE, AMYLASE in the last 168 hours. No results for input(s): AMMONIA in the last 168 hours. Coagulation Profile: No results for input(s): INR, PROTIME in the last 168 hours. Unresulted Labs (From admission, onward)     Start     Ordered   05/14/24 0500  Basic metabolic panel with GFR  Daily,   R      05/13/24 1203   05/14/24 0500  CBC with Differential/Platelet  Daily,   R      05/13/24 1203           Antimicrobials/Microbiology: Anti-infectives (From admission, onward)    None         Component Value Date/Time   SDES  12/05/2023 2229    URINE, RANDOM Performed at [facility], [address], [place], [place] [address]    [facility]  12/05/2023 2229    NONE Reflexed from [id number] Performed at [facility], [address], [place], [place] [address]    CULT >=100,000 COLONIES/mL ESCHERICHIA COLI (A) 12/05/2023 2229   REPTSTATUS 12/08/2023 FINAL 12/05/2023 2229    Procedures:    Joan LAMY, MD [facility] 05/16/2024, 11:45 AM

## 2024-05-16 NOTE — Plan of Care (Signed)

## 2024-05-16 NOTE — Progress Notes (Signed)
  Progress Note  Patient Name: Joan [name] Date of Encounter: 05/16/2024 [facility] Cardiologist: Jennifer JONELLE Crape, MD   Interval Summary   Respiratory status improved.  Wore CPAP overnight.  Did not lie flat.  Mild edema persists.  Vital Signs Vitals:   05/15/24 2331 05/15/24 2349 05/16/24 0425 05/16/24 0633  BP: 108/67   106/61  Pulse: 77 76    Resp: 20 20 20    Temp: 97.9 F (36.6 C)  99.2 F (37.3 C)   TempSrc: Oral  Axillary   SpO2: 97% 93%    Weight:      Height:        Intake/Output Summary (Last 24 hours) at 05/16/2024 0812 Last data filed at 05/16/2024 0426 Gross per 24 hour  Intake 690 ml  Output 2450 ml  Net -1760 ml      05/15/2024    5:00 AM 05/14/2024    3:55 AM 05/13/2024    4:01 AM  Last 3 Weights  Weight (lbs) 357 lb 2.3 oz 381 lb 13.4 oz 350 lb 5 oz  Weight (kg) 162 kg 173.2 kg 158.9 kg      Telemetry/ECG  Sinus rhythm- Personally Reviewed  Physical Exam  GEN: No acute distress.   Neck: No JVD Cardiac: RRR, no murmurs, rubs, or gallops.  Respiratory: Clear to auscultation bilaterally. GI: Soft, nontender, non-distended  MS: No edema  Assessment & Plan   1.  Acute on chronic diastolic heart failure: Ejection fraction 45 to 50%.  Volume status difficult due to morbid obesity but does have some lower extremity edema.  Creatinine is currently pending.  Cynthia Stainback continue with diuresis.  2.  Hypertension: Continue metoprolol, Aldactone, losartan  3.  SVT: Has had sinus pauses.  Metoprolol reduced.  Less an issue now that she is using CPAP.  4.  Elevated troponin: Likely due to demand ischemia  5.  Morbid obesity and likely untreated sleep apnea: Trevar Boehringer need sleep study at discharge  6.  Moderate aortic stenosis: Outpatient follow-up    For questions or updates, please contact [facility] Please consult [email or web address] for contact info under         Signed, Jihan Mellette Gladis Norton, MD

## 2024-05-17 DIAGNOSIS — Z86718 Personal history of other venous thrombosis and embolism: Secondary | ICD-10-CM | POA: Diagnosis not present

## 2024-05-17 DIAGNOSIS — I5033 Acute on chronic diastolic (congestive) heart failure: Secondary | ICD-10-CM | POA: Diagnosis not present

## 2024-05-17 DIAGNOSIS — N179 Acute kidney failure, unspecified: Secondary | ICD-10-CM

## 2024-05-17 DIAGNOSIS — I5031 Acute diastolic (congestive) heart failure: Secondary | ICD-10-CM | POA: Diagnosis not present

## 2024-05-17 DIAGNOSIS — E861 Hypovolemia: Secondary | ICD-10-CM

## 2024-05-17 HISTORY — DX: Hypovolemia: E86.1

## 2024-05-17 HISTORY — DX: Acute kidney failure, unspecified: N17.9

## 2024-05-17 LAB — CBC WITH DIFFERENTIAL/PLATELET
Abs Immature Granulocytes: 0.02 K/uL (ref 0.00–0.07)
Basophils Absolute: 0 K/uL (ref 0.0–0.1)
Basophils Relative: 0 %
Eosinophils Absolute: 0.2 K/uL (ref 0.0–0.5)
Eosinophils Relative: 2 %
HCT: 31.6 % — ABNORMAL LOW (ref 36.0–46.0)
Hemoglobin: 9.9 g/dL — ABNORMAL LOW (ref 12.0–15.0)
Immature Granulocytes: 0 %
Lymphocytes Relative: 30 %
Lymphs Abs: 2.3 K/uL (ref 0.7–4.0)
MCH: 26 pg (ref 26.0–34.0)
MCHC: 31.3 g/dL (ref 30.0–36.0)
MCV: 82.9 fL (ref 80.0–100.0)
Monocytes Absolute: 0.7 K/uL (ref 0.1–1.0)
Monocytes Relative: 10 %
Neutro Abs: 4.4 K/uL (ref 1.7–7.7)
Neutrophils Relative %: 58 %
Platelets: 261 K/uL (ref 150–400)
RBC: 3.81 MIL/uL — ABNORMAL LOW (ref 3.87–5.11)
RDW: 14.8 % (ref 11.5–15.5)
WBC: 7.6 K/uL (ref 4.0–10.5)
nRBC: 0 % (ref 0.0–0.2)

## 2024-05-17 LAB — BASIC METABOLIC PANEL WITH GFR
Anion gap: 12 (ref 5–15)
BUN: 35 mg/dL — ABNORMAL HIGH (ref 8–23)
CO2: 28 mmol/L (ref 22–32)
Calcium: 9.1 mg/dL (ref 8.9–10.3)
Chloride: 99 mmol/L (ref 98–111)
Creatinine, Ser: 2.42 mg/dL — ABNORMAL HIGH (ref 0.44–1.00)
GFR, Estimated: 22 mL/min — ABNORMAL LOW (ref >60)
Glucose, Bld: 116 mg/dL — ABNORMAL HIGH (ref 70–99)
Potassium: 4.6 mmol/L (ref 3.5–5.1)
Sodium: 139 mmol/L (ref 135–145)

## 2024-05-17 MED ORDER — HYDRALAZINE HCL 25 MG PO TABS: 25.0000 mg | ORAL_TABLET | Freq: Three times a day (TID) | ORAL | Status: DC | PRN

## 2024-05-17 MED ORDER — METOPROLOL TARTRATE 5 MG/5ML IV SOLN
5.0000 mg | INTRAVENOUS | Status: DC | PRN
Start: 2024-05-17 — End: 2024-05-21

## 2024-05-17 NOTE — Progress Notes (Signed)
 Occupational Therapy Treatment Patient Details Name: Joan [name] MRN: [id number] DOB: 26-Oct-1955 Today's Date: 05/17/2024   History of present illness 68 y.o. female presents with SOB for 3 weeks and chest pain, fluid retention, with medical history significant of hypertension, diastolic congestive heart failure, diabetes mellitus type 2, history of DVT, morbid obesity, OSA presents with shortness of breath and fluid buildup.   OT comments  Pt making good progress with functional goals, states that she feels better and would like to d/c home vs to SNF for rehab. Pt CGA with UB ADLs, mod A  2 trials STS, min SPT to [facility] for toileting tasks mod A. OT will continue to follow acutely to maximize level of function and safety      If plan is discharge home, recommend the following:  A lot of help with bathing/dressing/bathroom;A lot of help with walking and/or transfers;Assistance with cooking/housework;Assist for transportation;Help with stairs or ramp for entrance   Equipment Recommendations   (bari BSC, RW)    Recommendations for Other Services      Precautions / Restrictions Precautions Precautions: Fall Recall of Precautions/Restrictions: Intact Restrictions Weight Bearing Restrictions Per Provider Order: No       Mobility Bed Mobility               General bed mobility comments: pt in chair upon arrival    Transfers Overall transfer level: Needs assistance Equipment used: Rolling walker (2 wheels) Transfers: Sit to/from Stand, Bed to chair/wheelchair/BSC Sit to Stand: Mod assist, From elevated surface     Step pivot transfers: Min assist     General transfer comment: mod A with 2 trials to acheieve STS form recliner, min A to SPT to [facility]     Balance Overall balance assessment: Needs assistance Sitting-balance support: No upper extremity supported, Feet supported Sitting balance-Leahy Scale: Good     Standing balance support: Bilateral upper extremity  supported, During functional activity, Reliant on assistive device for balance Standing balance-Leahy Scale: Poor                             ADL either performed or assessed with clinical judgement   ADL Overall ADL's : Needs assistance/impaired     Grooming: Wash/dry hands;Wash/dry face;Set up;Sitting   Upper Body Bathing: Contact guard assist;Sitting       Upper Body Dressing : Contact guard assist;Sitting       Toilet Transfer: Moderate assistance;Minimal assistance;Cueing for safety;Rolling walker (2 wheels);Stand-pivot;BSC/3in1   Toileting- Architect and Hygiene: Moderate assistance;Sitting/lateral lean;Sit to/from stand              Extremity/Trunk Assessment Upper Extremity Assessment Upper Extremity Assessment: Overall WFL for tasks assessed;Generalized weakness   Lower Extremity Assessment Lower Extremity Assessment: Defer to PT evaluation        Vision Baseline Vision/History: 1 Wears glasses Ability to See in Adequate Light: 0 Adequate Patient Visual Report: No change from baseline     Perception     Praxis     Communication Communication Communication: No apparent difficulties   Cognition Arousal: Alert   Cognition: No apparent impairments                               Following commands: Intact        Cueing   Cueing Techniques: Verbal cues  Exercises      Shoulder Instructions  General Comments      Pertinent Vitals/ Pain       Pain Assessment Pain Assessment: No/denies pain  Home Living                                          Prior Functioning/Environment              Frequency  Min 2X/week        Progress Toward Goals  OT Goals(current goals can now be found in the care plan section)  Progress towards OT goals: Progressing toward goals     Plan      Co-evaluation                 AM-PAC OT 6 Clicks Daily Activity     Outcome  Measure   Help from another person eating meals?: None Help from another person taking care of personal grooming?: A Little Help from another person toileting, which includes using toliet, bedpan, or urinal?: A Lot Help from another person bathing (including washing, rinsing, drying)?: A Lot Help from another person to put on and taking off regular upper body clothing?: A Little Help from another person to put on and taking off regular lower body clothing?: A Lot 6 Click Score: 16    End of Session Equipment Utilized During Treatment: Gait belt;Rolling walker (2 wheels);Other (comment) (BSC)  OT Visit Diagnosis: Unsteadiness on feet (R26.81);Other abnormalities of gait and mobility (R26.89);Muscle weakness (generalized) (M62.81)   Activity Tolerance Patient tolerated treatment well   Patient Left with call bell/phone within reach;Other (comment);in chair   Nurse Communication Mobility status        Time: 1202-1228 OT Time Calculation (min): 26 min  Charges: OT General Charges $OT Visit: 1 Visit OT Treatments $Self Care/Home Management : 8-22 mins $Therapeutic Activity: 8-22 mins   Jacques Karna Loose 05/17/2024, 3:20 PM

## 2024-05-17 NOTE — Plan of Care (Signed)

## 2024-05-17 NOTE — Progress Notes (Signed)
 Physical Therapy Treatment Patient Details Name: Joan [name] MRN: [id number] DOB: 01/01/1956 Today's Date: 05/17/2024   History of Present Illness 68 y.o. female presents with SOB for 3 weeks and chest pain, fluid retention, with medical history significant of hypertension, diastolic congestive heart failure, diabetes mellitus type 2, history of DVT, morbid obesity, OSA presents with shortness of breath and fluid buildup.    PT Comments  Pt with decline in mobility this morning, reports due to pain in bilateral knees. Pt needing significant assistance to complete sit-stand transfers, up to modA and elevated bed to power up. Pt then able to manage multiple bouts of standing marches but with poor endurance, heavy dependence on UE support, and minA for balance. Pt reports weakness in LE and knees feeling frozen and numb which limited further mobility. Will continue to follow and progress mobility, recommendations updated to continued inpatient therapies <3hours/day as pt without support at home to assist with mobility, transfers, or ADLs.     If plan is discharge home, recommend the following: Assistance with cooking/housework;Help with stairs or ramp for entrance;A lot of help with walking and/or transfers;A lot of help with bathing/dressing/bathroom   Can travel by private vehicle     No  Equipment Recommendations   (Bariatric RW)    Recommendations for Other Services       Precautions / Restrictions Precautions Precautions: Fall Recall of Precautions/Restrictions: Intact Restrictions Weight Bearing Restrictions Per Provider Order: No     Mobility  Bed Mobility Overal bed mobility: Needs Assistance Bed Mobility: Supine to Sit     Supine to sit: HOB elevated, Used rails, Contact guard, Min assist     General bed mobility comments: moves well with bed mobility HOB up 45 degrees,  min A for BLEs.    Transfers Overall transfer level: Needs assistance Equipment used:  Rolling walker (2 wheels) Transfers: Sit to/from Stand, Bed to chair/wheelchair/BSC Sit to Stand: Mod assist, From elevated surface   Step pivot transfers: Min assist       General transfer comment: pt needing maxA initially to power up, with reps and elevated surface pt able to complete with modA. remains highly dependent on UE support and pulling up to standing. incontinent in standing with coughing    Ambulation/Gait Ambulation/Gait assistance: Min assist Gait Distance (Feet): 3 Feet Assistive device: Rolling walker (2 wheels) Gait Pattern/deviations: Decreased step length - right, Decreased step length - left, Decreased stride length, Step-to pattern       General Gait Details: maintains significant trunk flexion, poor endurance due to reports of LE fatigue   Stairs             Wheelchair Mobility     Tilt Bed    Modified Rankin (Stroke Patients Only)       Balance Overall balance assessment: Needs assistance Sitting-balance support: No upper extremity supported, Feet supported Sitting balance-Leahy Scale: Good     Standing balance support: Bilateral upper extremity supported, During functional activity, Reliant on assistive device for balance Standing balance-Leahy Scale: Poor Standing balance comment: dependent on UE support and min-modA                            Communication Communication Communication: No apparent difficulties  Cognition Arousal: Alert Behavior During Therapy: WFL for tasks assessed/performed  Following commands: Intact      Cueing Cueing Techniques: Verbal cues  Exercises Other Exercises Other Exercises: sit-stand from EOB x5 Other Exercises: standing marches x 20    General Comments General comments (skin integrity, edema, etc.): VSS on RA, pt reports concern for soft BP but it improved with change in positioning, SpO2 >93% on RA      Pertinent Vitals/Pain Pain  Assessment Pain Assessment: No/denies pain     PT Goals (current goals can now be found in the care plan section) Acute Rehab PT Goals Patient Stated Goal: HOME, pool therapy, find help to get a power w/c transport for the car. PT Goal Formulation: With patient Time For Goal Achievement: 05/28/24 Progress towards PT goals: Not progressing toward goals - comment (regression from past session mobility)    Frequency    Min 3X/week       AM-PAC PT 6 Clicks Mobility   Outcome Measure  Help needed turning from your back to your side while in a flat bed without using bedrails?: A Little Help needed moving from lying on your back to sitting on the side of a flat bed without using bedrails?: A Little Help needed moving to and from a bed to a chair (including a wheelchair)?: A Lot Help needed standing up from a chair using your arms (e.g., wheelchair or bedside chair)?: A Lot Help needed to walk in hospital room?: A Lot Help needed climbing 3-5 steps with a railing? : Total 6 Click Score: 13    End of Session Equipment Utilized During Treatment: Oxygen Activity Tolerance: Patient tolerated treatment well;Patient limited by fatigue Patient left: in bed;with call bell/phone within reach;Other (comment) (sitting EOB) Nurse Communication: Mobility status PT Visit Diagnosis: Difficulty in walking, not elsewhere classified (R26.2);Other abnormalities of gait and mobility (R26.89)     Time: 8981-8898 PT Time Calculation (min) (ACUTE ONLY): 43 min  Charges:    $Therapeutic Exercise: 23-37 mins $Therapeutic Activity: 8-22 mins PT General Charges $$ ACUTE PT VISIT: 1 Visit                     Izetta Call, PT, DPT   Acute Rehabilitation Department Office [phone] Secure Chat Communication Preferred\   Izetta JULIANNA Call 05/17/2024, 11:36 AM

## 2024-05-17 NOTE — Progress Notes (Signed)
 Rounding Note    Patient Name: Joan [name] Date of Encounter: 05/17/2024  [facility] Cardiologist: Jennifer JONELLE Crape, MD   Subjective   Feels like her kidneys are hurting from the fluid removal. Does note that legs are not as firm as before. Can tell that she has had a lot of fluid come off.   Inpatient Medications    Scheduled Meds:  apixaban   5 mg Oral BID   empagliflozin  10 mg Oral Daily   ezetimibe   10 mg Oral QHS   And   simvastatin   40 mg Oral QHS   Fe Fum-Vit C-Vit B12-FA  1 capsule Oral Weekly   hydrALAZINE  100 mg Oral Q8H   losartan  100 mg Oral Daily   metoprolol succinate  50 mg Oral Daily   pantoprazole   40 mg Oral Daily   sodium chloride  flush  3 mL Intravenous Once   sodium chloride  flush  3 mL Intravenous Q12H   spironolactone  25 mg Oral Daily   Continuous Infusions:  PRN Meds: acetaminophen **OR** acetaminophen, albuterol , alum & mag hydroxide-simeth, hydrocortisone  cream, methocarbamol , metoprolol tartrate, trimethobenzamide   Vital Signs    Vitals:   05/17/24 0500 05/17/24 0625 05/17/24 0800 05/17/24 1137  BP:  112/67 110/67 119/78  Pulse:   80 85  Resp:   20 (!) 24  Temp:   98.5 F (36.9 C) 97.8 F (36.6 C)  TempSrc:   Oral Oral  SpO2:   98% 97%  Weight: (!) 152.4 kg     Height:        Intake/Output Summary (Last 24 hours) at 05/17/2024 1342 Last data filed at 05/17/2024 0830 Gross per 24 hour  Intake 603 ml  Output 550 ml  Net 53 ml      05/17/2024    5:00 AM 05/15/2024    5:00 AM 05/14/2024    3:55 AM  Last 3 Weights  Weight (lbs) 335 lb 15.7 oz 357 lb 2.3 oz 381 lb 13.4 oz  Weight (kg) 152.4 kg 162 kg 173.2 kg      Telemetry    Sinus with intermittent SVT - Personally Reviewed  Physical Exam   GEN: No acute distress.   Neck: unable to visualize JVD Cardiac: RRR, no murmurs, rubs, or gallops.  Respiratory: Clear to auscultation bilaterally. GI: Soft, nontender, non-distended  MS: No firm edema; No  deformity. Neuro:  Nonfocal  Psych: Normal affect   New pertinent results (labs, ECG, imaging, cardiac studies)    Summarized below  Assessment & Plan    Acute on chronic diastolic heart failure -diuresis held yesterday due to elevated creatinine, up to 2.33 (was 1.6 day prior). Today rising at 2.42. BUN 35. Continue to hold diuretic -echo this admission with mildly reduced EF 45-50% -on empagliflozin, hydralazine (stopping, see below), losartan (held for AKI), metoprolol, spironolactone for GDMT -will hold SGLT2i and MRA if renal function does not improve tomorrow AM -if unable to determine volume status and renal function continues to worsen, may need to consider right heart cath. However, based on large volume diuresis, I suspect she is intravascularly depleted and needs time to equilibrate -admission weight 175.8 kg, current weight 152.4 kg, charted net negative 10 L but weights suggests this might be an underestimate -at visit with Dr. Crape 11/11/33, weight was 175.5 kg but had been 156.5 kg about a week prior. Based on this, suspect she is at her dry weight or a little under  Hypertension:  meds as above. Losartan held due to AKI. Metoprolol held this AM due to low BP, will restart. Will hold standing hydralazine given intermittent low BP, will order PRN for systolic above 160  Paroxysmal SVT: has had a few episodes of brief SVT in the last 24 hours, but no pauses. Continue metoprolol at reduced dose  Mild aortic stenosis: follow as outpatient  Hyperlipidemia: continue simvastatin , ezetimibe   History of DVT: on apixaban . No PE on CT this admission.  OSA: continue CPAP  Type II diabetes: on SGLT2i as above. On GLP as outpatient given diabetes, heart failure, obesity, sleep apnea    Signed, [name], MD  05/17/2024, 1:42 PM

## 2024-05-17 NOTE — TOC Progression Note (Signed)
 Transition of Care [facility]) - Progression Note    Patient Details  Name: Joan [name] MRN: [id number] Date of Birth: [birth year]  Transition of [facility] CM/SW Contact  Isaiah Public, LCSWA Phone Number: 05/17/2024, 4:00 PM  Clinical Narrative:     CSW received consult for possible SNF placement at time of discharge. CSW spoke with patient at bedside regarding PT recommendation of SNF placement at time of discharge. Patient reports PTA she comes from home with significant other. Patient expressed understanding of PT recommendation and politely declined SNF placement at time of discharge. Patient reports her plan is to return home with significant other. CSW informed patient that Erminio will follow up for questions for home needs. Patient would like to return home with HHPT.  CSW informed Erminio CM. All questions answered. No further questions reported at this time. CSW to continue to follow and assist with discharge planning needs.   Expected Discharge Plan: Home/Self Care Barriers to Discharge: Continued Medical Work up               Expected Discharge Plan and Services   Discharge Planning Services: CM Consult Post Acute Care Choice: Skilled Nursing Facility Living arrangements for the past 2 months: Apartment                 DME Arranged: Bedside commode, Walker rolling (Both bariatric)   Date DME Agency Contacted: 05/14/24 Time DME Agency Contacted: [phone] Representative spoke with at DME Agency: Zack             Social Drivers of Health (SDOH) Interventions SDOH Screenings   Food Insecurity: No Food Insecurity (05/14/2024)  Housing: Low Risk  (05/14/2024)  Transportation Needs: No Transportation Needs (05/13/2024)  Utilities: Not At Risk (05/14/2024)  Alcohol Screen: Low Risk  (05/13/2024)  Depression (PHQ2-9): Low Risk  (11/29/2020)  Financial Resource Strain: Low Risk  (05/13/2024)  Social Connections: Unknown (05/14/2024)  Tobacco Use: Low Risk   (05/14/2024)    Readmission Risk Interventions     No data to display

## 2024-05-17 NOTE — Progress Notes (Signed)
 [facility] night cross cover note:   Patient with paroxysmal SVT, had run of SVT with sustained heart rates in the 130s lasting for approximately 15 minutes.  Asymptomatic at that time, with vital signs otherwise stable, including  systolic blood pressures at that time in the 120s mmHg.   Has spontaneously converted back to sinus rhythm, with ensuing heart rates in the 80s.  Will add as needed IV Lopressor for sustained heart rates greater than 130 bpm.     Eva Pore, DO Hospitalist

## 2024-05-17 NOTE — Progress Notes (Signed)
 PROGRESS NOTE Joan [name]  FMW:[id number] DOB: 15-Apr-1956 DOA: 05/11/2024 PCP: Pcp, No  Brief Narrative/Hospital Course: Joan [name] is a 67 y.o. female with PMH of hypertension, diastolic congestive heart failure, diabetes mellitus type 2, history of DVT, morbid obesity, OSA presents with shortness of breath x  3 weeks and on 10/28 night, she had mild chest pain located in the upper chest area, which she thinks might be related to reflux. She is not on oxygen therapy. She has been dealing with fluid retention that has persisted despite taking 40 mg of Lasix  for seven days.She describes issues with her lymphatic system, noting that the fluid is not moving. She is not currently on oxygen but has been using a CPAP machine, which broke back in February. Despite efforts, she has not received a replacement CPAP machine. She receives Jelsyn three shots every six months for an unspecified condition, as she does not take cortisone. In the ZI:jqzampoz with pulse 73-1 41, respirations 17-39, blood pressures elevated up to 179/115, and O2 saturations currently maintained on 4 L nasal cannula oxygen.  Labs significant for BNP 4971, high-sensitivity troponins 43 ->43, hemoglobin 8.9, BUN 20, and creatinine 1.42.  Chest x-ray showed no acute abnormality CT angiogram of the chest noted no pulmonary embolism with enlarged main pulmonary artery measuring 3.3 cm and cardiomegaly. Patient admitted cardiology consulted and managed with aggressive diuresis/GDMT  Subjective: Seen and examined Feels weak requesting PT OT, not much urine output overnight she states Used CPAP last night. Denies chest pain, shortness of breath stable Overnight afebrile BP stable in 110s, creatinine up at 2.4 from 2.3 BP had been soft 90s-96 systolic this morning, used CPAP last night Urine output further up 4000cc> 2450 cc>> 500 cc but with unmeasured urine output x 1 weight further down from 395 lb> 387>350>381lb??> 357>  335 Overnight SVT noticed that he spontaneously converted to normal sinus rhythm  Assessment and plan:  Acute on chronic combined mild systolic and diastolic congestive heart failure: Presenting with worsening shortness of breath, fluid overload and abnormal BNP 4971-CT negative for PE. TTE-EF 45-50% mildly decreased function no RWMA G1DD- Cardiology on board input appreciated  Cont IV diuresis,GDMT- w/ Toprol (decreased dose due to bradycardia), losartan, Aldactone and hydralazine-but she had soft blood pressure 11/2 losartan metoprolol, lasix  wererheld > BP stable today but creatinine trending up  Continue monitor renal fun, CONT diuretics and GDMT per cardiology Difficult to assess volume status due to body habitus-ask nursing staff to get a standing weight Net netive balance Net IO Since Admission: -10,264 mL [05/17/24 1014] Cont to monitor daily I/O,weight, electrolytes.Keep on  salt/fluid restricted diet and monitor in tele.  Filed Weights   05/14/24 0355 05/15/24 0500 05/17/24 0500  Weight: (!) 173.2 kg (!) 162 kg (!) 152.4 kg    Recent Labs  Lab 05/11/24 2026 05/12/24 1317 05/13/24 0238 05/14/24 0416 05/15/24 0420 05/16/24 0943 05/17/24 0650  PROBNP 4,971.0*  --   --   --   --   --   --   BUN 20   < > 19 24* 24* 32* 35*  CREATININE 1.42*   < > 1.60* 1.58* 1.60* 2.33* 2.42*  K 3.5   < > 4.0 3.9 4.1 3.9 4.6   < > = values in this interval not displayed.   SVT converted to NSR overnight 11/2 Essential hypertension: On GDMT as above, BP somewhat soft this morning-holding meds, cardiology aware  Elevated troponin: Rather flat -likely  demand ischemia due  to CHF  SVT versus atrial tachycardia with episode of bradycardia during sleep: Atrial tachycardia suspected, also bradycardic during sleep likely from sleep apnea. Given her bradycardia- decreased metoprolol per cardio .Encouraged use of CPAP. Continue to monitor on telemetry   AKI on CKD3b: Creat1.42 on admission.   B/Lpreviously ~1.2-labs with creatinine uptrending this morning adjust GDMT diuretics per cardiology-holding for now Recent Labs    12/05/23 2221 05/11/24 2026 05/12/24 1317 05/13/24 0238 05/14/24 0416 05/15/24 0420 05/16/24 0943 05/17/24 0650  BUN 31* 20 16 19  24* 24* 32* 35*  CREATININE 1.25* 1.42* 1.27* 1.60* 1.58* 1.60* 2.33* 2.42*  CO2 25 27 26 29 30 30 30 28   K 4.0 3.5 3.5 4.0 3.9 4.1 3.9 4.6   Normocytic anemia: Monitor hb-baseline ~8 to 9 g, suspect in the setting of CKD.monitor. resume iron. Recent Labs  Lab 05/13/24 0238 05/14/24 0416 05/15/24 0420 05/16/24 0943 05/17/24 0650  HGB 9.2* 9.3* 9.5* 9.5* 9.9*  HCT 30.9* 30.3* 31.0* 31.2* 31.6*    History of DVT/PE on chronic anticoagulation since 01/2020: Continue Eliquis    Prolonged QT interval 538. Avoid QT prolonging medications. Correct any electrolyte abnormalities   T2DM without long-term use of insulin: Last available hemoglobin A1c noted to be 5.3 when checked 7 months ago.Cont Jardiance.  Blood sugar 101-127 on lab   Hyperlipidemia Continue ezetimibe -simvastatin    OSA Patient had not recently been on CPAP after it broke earlier this year in Feb,she has been trying to get a replacement, but has not been able to yet. Ordered CPAP nightly here   GERD Continue omeprazole  Morbid Obesity w/ Body mass index is 54.23 kg/m.: Will benefit with PCP follow-up, weight loss,healthy lifestyle and outpatient Mounjaro  Mobility: PT Orders: Active  PT Follow up Rec: Other (Comment) (Pt Would Love Her Therapy To Be In The Pool,   [place] On [address].)05/14/2024 1326    DVT prophylaxis: Eliquis  Code Status:   Code Status: Full Code Family Communication: plan of care discussed with patient at bedside. Patient status is: Remains hospitalized because of severity of illness Level of care: Telemetry   Dispo: The patient is from: home            Anticipated disposition: TBD Objective: Vitals last 24  hrs: Vitals:   05/17/24 0320 05/17/24 0500 05/17/24 0625 05/17/24 0800  BP: 112/67  112/67 110/67  Pulse: 78     Resp: 18     Temp: 99 F (37.2 C)   98.5 F (36.9 C)  TempSrc: Oral   Oral  SpO2: 96%     Weight:  (!) 152.4 kg    Height:        Physical Examination: General exam:  AAOX3, pleasant, NAD, morbidly obese HEENT:Oral mucosa moist, Ear/Nose WNL grossly Respiratory system: Bilaterally diminished at basese, no use of accessory muscle Cardiovascular system: S1 & S2 +, No JVD. Gastrointestinal system: Abdomen soft, obese,ND, BS+ Nervous System: Alert, awake, moving all extremities,and following commands. Extremities: extremities warm, leg edema trace Skin: Warm, no rashes MSK: Normal muscle bulk,tone, power   Medications reviewed:  Scheduled Meds:  apixaban   5 mg Oral BID   empagliflozin  10 mg Oral Daily   ezetimibe   10 mg Oral QHS   And   simvastatin   40 mg Oral QHS   Fe Fum-Vit C-Vit B12-FA  1 capsule Oral Weekly   hydrALAZINE  100 mg Oral Q8H   losartan  100 mg Oral Daily   metoprolol succinate  50 mg Oral Daily  pantoprazole   40 mg Oral Daily   sodium chloride  flush  3 mL Intravenous Once   sodium chloride  flush  3 mL Intravenous Q12H   spironolactone  25 mg Oral Daily   Continuous Infusions: Diet: Diet Order             Diet heart healthy/carb modified Room service appropriate? Yes; Fluid consistency: Thin  Diet effective now                    Data Reviewed: I have personally reviewed following labs and imaging studies ( see epic result tab) CBC: Recent Labs  Lab 05/11/24 2026 05/13/24 0238 05/14/24 0416 05/15/24 0420 05/16/24 0943 05/17/24 0650  WBC 7.1 7.4 9.3 6.9 6.7 7.6  NEUTROABS 4.8  --  6.9 4.6 4.3 4.4  HGB 8.9* 9.2* 9.3* 9.5* 9.5* 9.9*  HCT 28.9* 30.9* 30.3* 31.0* 31.2* 31.6*  MCV 84.3 86.1 84.2 83.3 84.6 82.9  PLT 216 204 226 232 235 261   CMP: Recent Labs  Lab 05/13/24 0238 05/14/24 0416 05/15/24 0420 05/16/24 0943  05/17/24 0650  NA 142 141 141 138 139  K 4.0 3.9 4.1 3.9 4.6  CL 101 100 98 97* 99  CO2 29 30 30 30 28   GLUCOSE 111* 104* 101* 120* 116*  BUN 19 24* 24* 32* 35*  CREATININE 1.60* 1.58* 1.60* 2.33* 2.42*  CALCIUM 8.6* 8.8* 8.9 8.8* 9.1   GFR: Estimated Creatinine Clearance: 34.8 mL/min (A) (by C-G formula based on SCr of 2.42 mg/dL (H)). Recent Labs  Lab 05/11/24 2026  AST 24  ALT 19  ALKPHOS 94  BILITOT 0.4  PROT 6.6  ALBUMIN 3.7   No results for input(s): LIPASE, AMYLASE in the last 168 hours. No results for input(s): AMMONIA in the last 168 hours. Coagulation Profile: No results for input(s): INR, PROTIME in the last 168 hours. Unresulted Labs (From admission, onward)     Start     Ordered   05/14/24 0500  Basic metabolic panel with GFR  Daily,   R      05/13/24 1203   05/14/24 0500  CBC with Differential/Platelet  Daily,   R      05/13/24 1203           Antimicrobials/Microbiology: Anti-infectives (From admission, onward)    None         Component Value Date/Time   SDES  12/05/2023 2229    URINE, RANDOM Performed at [facility], [address], [place], [place] [address]    [facility]  12/05/2023 2229    NONE Reflexed from [id number] Performed at [facility], [address] [place], [place] [address]    CULT >=100,000 COLONIES/mL ESCHERICHIA COLI (A) 12/05/2023 2229   REPTSTATUS 12/08/2023 FINAL 12/05/2023 2229    Procedures:    Mennie LAMY, MD [facility] 05/17/2024, 10:15 AM

## 2024-05-17 NOTE — Progress Notes (Signed)
 Mobility Specialist Progress Note;   05/17/24 1516  Mobility  Activity Ambulated with assistance  Level of Assistance Moderate assist, patient does 50-74%  Assistive Device Front wheel walker  Distance Ambulated (ft) 180 ft (100+80)  Activity Response Tolerated well  Mobility Referral Yes  Mobility visit 1 Mobility  Mobility Specialist Start Time (ACUTE ONLY) 1516  Mobility Specialist Stop Time (ACUTE ONLY) 1538  Mobility Specialist Time Calculation (min) (ACUTE ONLY) 22 min   Pt requesting to ambulate in hallway, in chair upon arrival. Required ModA+2 to stand from chair, however once standing required light MinG assistance during ambulation. Took 2x seated rest breaks d/t fatigued BLE. C/o L knee pain throughout. SPO2 93%> on RA throughout ambulation. Pt returned back to chair and left with all needs met.   Lauraine Erm Mobility Specialist Please contact via SecureChat or [employer] [phone]

## 2024-05-17 NOTE — Progress Notes (Signed)
   Heart Failure Stewardship Pharmacist Progress Note   PCP: Pcp, No PCP-Cardiologist: Jennifer JONELLE Crape, MD    HPI:  68 yo F with PMH of T2DM, HTN, HLD, OSA, DVT/PE, CKD III, and CHF.   Presented to the ED on 10/28 with worsening shortness of breath and LE edema. BNP elevated. CXR with no acute findings, stable cardiomegaly. CTA negative for PE. ECHO 10/29 with LVEF 45-50% (60-65% in 11/2023), no RWMA, G1DD, RV mildly reduced, moderate MR.   Reviewed changes to GDMT and goals of therapy. Does not have a pill box at home but will get one. Reports taking all medications (including Eliquis ) at one time per day. She was taking both Eliquis  tablets at the same time of the day so she wouldn't forget to take the evening dose. Education provided.   Still somewhat short of breath. Tells me she hasn't used a CPAP in several months because hers was broken. Still appears volume overloaded but difficult to assess with body habitus. About to work with therapy.  Current HF Medications: Beta Blocker: metoprolol XL 50 mg daily ACE/ARB/ARNI: losartan 100 mg daily MRA: spironolactone 25 mg daily SGLT2i: Jardiance 10 mg daily Other: hydralazine 100 mg TID  Prior to admission HF Medications: Diuretic: furosemide  40 mg daily ACE/ARB/ARNI: losartan/hctz 100/25 mg daily SGLT2i: Jardiance 10 mg daily  Pertinent Lab Values: Serum creatinine 2.42, BUN 35, Potassium 4.6, Sodium 139, BNP 4971   Vital Signs: Weight: 335 lbs (admission weight: 387 lbs) Blood pressure: 110/60s  Heart rate: 80s  I/O: net -0.2L yesterday; net -10.3L since admission  Medication Assistance / Insurance Benefits Check: Does the patient have prescription insurance?  Yes Type of insurance plan: [employer]  Outpatient Pharmacy:  Prior to admission outpatient pharmacy: [facility] Is the patient willing to use [facility] TOC pharmacy at discharge? Yes Is the patient willing to transition their outpatient pharmacy to utilize a [facility]  outpatient pharmacy?   No    Assessment: 1. Acute on chronic diastolic CHF (LVEF 45-50%). NYHA class III symptoms. - Off IV lasix . Worsening AKI. Strict I/Os and daily weights. Keep K>4 and Mg>2. KCl supplement still ordered. Recommend to stop given K today and no diuretics ordered.  - Continue metoprolol XL 50 mg daily - decreased dose due to bradycardia - Continue losartan 100 mg daily - reports cough with lisinopril . Dose was held yesterday due to AKI. Creatinine worsened today - recommend to continue holding.  - Continue spironolactone 25 mg daily. Dose already given this morning.  - Continue Jardiance 10 mg daily. Dose already given this morning.  - Continue hydralazine 100 mg TID  Plan: 1) Medication changes recommended at this time: - Stop KCl 40 mEq BID  - Hold losartan - May need to hold MRA and SGLT2i tomorrow if renal function continues to decline. Received doses of both this morning already.   2) Patient assistance: - None pending  3)  Education  - Patient has been educated on current HF medications and potential additions to HF medication regimen - Patient verbalizes understanding that over the next few months, these medication doses may change and more medications may be added to optimize HF regimen - Patient has been educated on basic disease state pathophysiology and goals of therapy - Educated on the importance of taking medications twice daily and reviewed strategies to help with compliance   Duwaine Plant, PharmD, BCPS Heart Failure Stewardship Pharmacist Phone [phone]

## 2024-05-18 DIAGNOSIS — N179 Acute kidney failure, unspecified: Secondary | ICD-10-CM | POA: Diagnosis not present

## 2024-05-18 DIAGNOSIS — Z7189 Other specified counseling: Secondary | ICD-10-CM

## 2024-05-18 DIAGNOSIS — G4730 Sleep apnea, unspecified: Secondary | ICD-10-CM | POA: Diagnosis not present

## 2024-05-18 DIAGNOSIS — I5033 Acute on chronic diastolic (congestive) heart failure: Secondary | ICD-10-CM | POA: Diagnosis not present

## 2024-05-18 HISTORY — DX: Other specified counseling: Z71.89

## 2024-05-18 LAB — BASIC METABOLIC PANEL WITH GFR
Anion gap: 12 (ref 5–15)
BUN: 35 mg/dL — ABNORMAL HIGH (ref 8–23)
CO2: 25 mmol/L (ref 22–32)
Calcium: 9.1 mg/dL (ref 8.9–10.3)
Chloride: 101 mmol/L (ref 98–111)
Creatinine, Ser: 2.43 mg/dL — ABNORMAL HIGH (ref 0.44–1.00)
GFR, Estimated: 21 mL/min — ABNORMAL LOW (ref >60)
Glucose, Bld: 120 mg/dL — ABNORMAL HIGH (ref 70–99)
Potassium: 4.4 mmol/L (ref 3.5–5.1)
Sodium: 138 mmol/L (ref 135–145)

## 2024-05-18 LAB — CBC WITH DIFFERENTIAL/PLATELET
Abs Immature Granulocytes: 0.01 K/uL (ref 0.00–0.07)
Basophils Absolute: 0 K/uL (ref 0.0–0.1)
Basophils Relative: 1 %
Eosinophils Absolute: 0.1 K/uL (ref 0.0–0.5)
Eosinophils Relative: 2 %
HCT: 31.1 % — ABNORMAL LOW (ref 36.0–46.0)
Hemoglobin: 9.6 g/dL — ABNORMAL LOW (ref 12.0–15.0)
Immature Granulocytes: 0 %
Lymphocytes Relative: 23 %
Lymphs Abs: 1.5 K/uL (ref 0.7–4.0)
MCH: 25.7 pg — ABNORMAL LOW (ref 26.0–34.0)
MCHC: 30.9 g/dL (ref 30.0–36.0)
MCV: 83.2 fL (ref 80.0–100.0)
Monocytes Absolute: 0.6 K/uL (ref 0.1–1.0)
Monocytes Relative: 10 %
Neutro Abs: 4.2 K/uL (ref 1.7–7.7)
Neutrophils Relative %: 64 %
Platelets: 248 K/uL (ref 150–400)
RBC: 3.74 MIL/uL — ABNORMAL LOW (ref 3.87–5.11)
RDW: 14.7 % (ref 11.5–15.5)
WBC: 6.5 K/uL (ref 4.0–10.5)
nRBC: 0 % (ref 0.0–0.2)

## 2024-05-18 NOTE — Progress Notes (Addendum)
 Patient seen and examined, note reviewed with the signed Advanced Practice Provider. I personally reviewed laboratory data, imaging studies and relevant notes. I independently examined the patient and formulated the important aspects of the plan. I have personally discussed the plan with the patient and/or family. Comments or changes to the note/plan are indicated below.  We had a prolonged discussion today about her heart, managing fluid, balance between getting fluid off and not hurting kidneys. Discussed right heart cath. She discussed her goals with weight loss, has come down from her peak weight of over 400 lbs, working to lose more, has done well on moujaro. Tried to tolerate CPAP overnight, wore for about 3.5 hours. See summary below  Exam with no acute distress. Unable to visualize JVD 2/2 body habitus. Lungs distant but clear. Regular S1/S2, no murmurs appreciated. No pitting edema.  Acute on chronic diastolic heart failure Acute kidney injury on chronic kidney disease stage 3b -diuresis held due to elevated creatinine, 2.43 today. BUN 35. Continue to hold diuretic -echo this admission with mildly reduced EF 45-50% -on empagliflozin (holding), hydralazine (stopped standing dose, see below), losartan (held for AKI), metoprolol, spironolactone (holding) for GDMT -we discussed right heart cath in detail today, how it might help us , risks/benefits. She will consider tomorrow if renal function not significantly improved -admission weight 175.8 kg, current weight 164.6 kg but was 152.4 kg yesterday, charted net negative 10 L, suggests weights inaccurate -at visit with Dr. Edwyna 11/11/33, weight was 175.5 kg but had been 156.5 kg about a week prior.  -reviewed heart failure education, including diet, fluid recommendations   Hypertension:  Historically hypertensive, but has been more hypotensive last few days. Losartan, spironolactone held due to AKI. Held standing hydralazine given intermittent  low BP, ordered PRN for systolic above 160   Paroxysmal SVT: has had a few episodes of brief SVT in the last 24 hours, but no pauses. 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 No PE on CT this admission.   OSA: continue CPAP   Type II diabetes: on SGLT2i as outpatient, held due to AKI. On GLP as outpatient given diabetes, heart failure, obesity, sleep apnea  Overall we discussed the difficulty we are having balancing her kidneys, fluid, and medications to help the heart. She is motivated to make changes. Difficult situation, high complexity medical decision making given the balance between kidneys/fluid and blood pressure as noted above. High risk of recurrent admissions.  Joan Bruckner, MD, PhD, [facility]  [facility]  [place]  Heart & Vascular at [facility] at [facility] [address] [place], [place] [address] [phone]          Progress Note  Patient Name: Joan [name] Date of Encounter: 05/18/2024 [name] Cardiologist: Joan Joan Edwyna, MD   Interval Summary   Laying comfortably on bed during the interview.  Able to lay flat.  Reported that she is unable to tolerate the CPAP on full pressure and that she took the CPAP off at about 3 AM last night.  Stated that she has pain on the right upper quadrant and that she has been burping a lot.  Also reported that she was thirsty last night and drank quite a bit of water.  Vital Signs Vitals:   05/17/24 2353 05/18/24 0353 05/18/24 0841 05/18/24 0843  BP: 131/64 127/84  130/73  Pulse: 97 86  80  Resp: 20 18    Temp: 98 F (36.7 C) 97.8 F (36.6 C) 98.5 F (36.9 C)   TempSrc: Oral Oral Oral   SpO2: 99% 96%    Weight:  (!) 164.6 kg    Height:        Intake/Output Summary (Last 24 hours) at 05/18/2024 0900 Last data filed  at 05/18/2024 0428 Gross per 24 hour  Intake 600 ml  Output 350 ml  Net 250 ml      05/18/2024    3:53 AM 05/17/2024    5:00 AM 05/15/2024    5:00 AM  Last 3 Weights  Weight (lbs) 362 lb 14.4 oz 335 lb 15.7 oz 357 lb 2.3 oz  Weight (kg) 164.61 kg 152.4 kg 162 kg      Telemetry/ECG  Underlying rhythm is normal sinus rhythm with heart rates in the 80s and 90s.  This morning from about 6 to 9 AM had multiple runs of SVT with rates in the 120s to 130s.- Personally Reviewed  Physical Exam  GEN: No acute distress.   Neck: Unable to assess JVD due to body habitus Cardiac: RRR, 2 out of 6 systolic murmur that radiates to the carotids. Respiratory: Clear to auscultation bilaterally. GI: Soft, nontender, non-distended  MS: 1+ bilateral lower extremity edema  Assessment & Plan   Acute on chronic HFpEF Moderate MR Mild AAS Echocardiogram showed a moderately reduced LVEF of 45 to 50%, no RWMA, mild concentric LVH, G1 DD, mildly reduced RV systolic function, normal pulmonary artery systolic pressure, moderately dilated left and right atrium, moderate MR, moderate MAC, aortic valve calcification, and mild aortic stenosis Current weight is 164.6 KGs this is down from 179.2 on admission.  I's and O's are net out 10 L. 2 days ago creatinine rose.  It was suspected that this was secondary to overdiuresis.  Because of this the patient's IV Lasix  was held.  Creatinine continues to remain elevated at 2.43. Discussed doing a right heart cath with the patient but patient refused to consider getting this procedure. Will need echocardiogram in 6 to 12 months to monitor aortic stenosis. Continue to hold IV Lasix . GDMT Hold MRA and SGLT2 due to concerns of AKI. Continue metoprolol succinate 50 mg daily   Hypertension Blood pressure continues to be well-managed.  Most recent BP 130/73.  Will continue to hold losartan and will hold MRA due to ongoing AKI as per GDMT above.  Continue as needed  hydralazine.   OSA recommend continuing CPAP use.  Recommend decreasing CPAP pressure as it appears like she is getting air in her stomach when CPAP is at full pressure.   Hyperlipidemia No coronary calcifications mentioned on CT angiography. Continue simvastatin  40 mg daily. Continue Zetia  10 mg daily.   Anemia Hemoglobin 9.6.  Normocytic.  Appears to be somewhat stable   DVT/PE On Eliquis  5 mg twice daily for this.   Paroxysmal SVT Continue metoprolol succinate 50 mg daily   Type 2 diabetes Hemoglobin A1c 5.9 in 2022. Hold SGLT2 as above On Mounjaro in the outpatient setting.     For questions or updates, please contact [facility] Please consult [email or web address] for contact info under        Signed, Zane Adams, PA-C

## 2024-05-18 NOTE — Progress Notes (Signed)
 PROGRESS NOTE Joan [name]  FMW:[id number] DOB: 07/25/1955 DOA: 05/11/2024 PCP: Pcp, No  Brief Narrative/Hospital Course: Joan [name] is a 68 y.o. female with PMH of hypertension, diastolic congestive heart failure, diabetes mellitus type 2, history of DVT, morbid obesity, OSA presents with shortness of breath x  3 weeks and on 10/28 night, she had mild chest pain located in the upper chest area, which she thinks might be related to reflux. She is not on oxygen therapy. She has been dealing with fluid retention that has persisted despite taking 40 mg of Lasix  for seven days.She describes issues with her lymphatic system, noting that the fluid is not moving. She is not currently on oxygen but has been using a CPAP machine, which broke back in February. Despite efforts, she has not received a replacement CPAP machine. She receives Jelsyn three shots every six months for an unspecified condition, as she does not take cortisone. In the ZI:jqzampoz with pulse 73-1 41, respirations 17-39, blood pressures elevated up to 179/115, and O2 saturations currently maintained on 4 L nasal cannula oxygen.  Labs significant for BNP 4971, high-sensitivity troponins 43 ->43, hemoglobin 8.9, BUN 20, and creatinine 1.42.  Chest x-ray showed no acute abnormality CT angiogram of the chest noted no pulmonary embolism with enlarged main pulmonary artery measuring 3.3 cm and cardiomegaly. Patient admitted cardiology consulted and managed with aggressive diuresis/GDMT  Subjective: Seen and examined Using the bedside panel, denies nausea vomiting chest pain.  Overall feels better Overnight afebrile BP stable, used CPAP last night Labs shows hemoglobin at 9.6 stable creatinine 2.4 stable weight further down from 395 lb> 387>350>381lb??> 357> 335>?362??  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 TTE-EF 45-50% mildly decreased function no RWMA G1DD- Cardiology on board input appreciated  S/p IV diuresis,GDMT- w/ Toprol (decreased dose due to bradycardia), losartan, Aldactone and hydralazine With creatinine trending up diuretics held and since BP was soft GDMT also held Cardiology following>?  RHC-continue plan per cardiology  Difficult to assess volume status due to body habitus. Net netive balance Net IO Since Admission: -10,014 mL [05/18/24 1130] Per Epic- at her  visit with Dr. Edwyna 11/11/33, weight was 175.5 kg but had been 156.5 kg about a week prior-asked nursing staff to recheck the weight due to inconsistency . Cont to monitor daily I/O,weight, electrolytes.Keep on  salt/fluid restricted diet and monitor in tele.  Filed Weights   05/15/24 0500 05/17/24 0500 05/18/24 0353  Weight: (!) 162 kg (!) 152.4 kg (!) 164.6 kg    Recent Labs  Lab 05/11/24 2026 05/12/24 1317 05/14/24 0416 05/15/24 0420 05/16/24 0943 05/17/24 0650 05/18/24 0403  PROBNP 4,971.0*  --   --   --   --   --   --   BUN 20   < > 24* 24* 32* 35* 35*  CREATININE 1.42*   < > 1.58* 1.60* 2.33* 2.42* 2.43*  K 3.5   < > 3.9 4.1 3.9 4.6 4.4   < > = values in this interval not displayed.   SVT versus atrial tachycardia with episode of bradycardia during sleep: Atrial tachycardia suspected, also bradycardic during sleep likely from sleep apnea.Given her bradycardia- decreased metoprolol per cardio Had PSVT- converted to NSR overnight 11/2 Encouraged use of CPAP. Continue to monitor on telemetry   Essential hypertension: On GDMT as above.  Cardiology following continue  metoprolol at reduced dose  Elevated troponin: Rather flat -likely  demand ischemia due to CHF  AKI on CKD3b: Creat1.42 on admission.B/Lpreviously ~1.2-creatinine trending up and has plateaued at 2.4 diuretics GDMT on hold Avoid nephrotoxic medication, dehydration hypotension, renally dose  meds. Recent Labs    12/05/23 2221 05/11/24 2026 05/12/24 1317 05/13/24 0238 05/14/24 0416 05/15/24 0420 05/16/24 0943 05/17/24 0650 05/18/24 0403  BUN 31* 20 16 19  24* 24* 32* 35* 35*  CREATININE 1.25* 1.42* 1.27* 1.60* 1.58* 1.60* 2.33* 2.42* 2.43*  CO2 25 27 26 29 30 30 30 28 25   K 4.0 3.5 3.5 4.0 3.9 4.1 3.9 4.6 4.4   Normocytic anemia: Monitor hb-baseline ~8 to 9 g, suspect in the setting of CKD.monitor. Recent Labs  Lab 05/14/24 0416 05/15/24 0420 05/16/24 0943 05/17/24 0650 05/18/24 0403  HGB 9.3* 9.5* 9.5* 9.9* 9.6*  HCT 30.3* 31.0* 31.2* 31.6* 31.1*    History of DVT/PE on chronic anticoagulation since 01/2020: Continue Eliquis    Prolonged QT interval 538. Avoid QT prolonging medications. Correct any electrolyte abnormalities   T2DM without long-term use of insulin: Last available hemoglobin A1c noted to be 5.3 when checked 7 months ago.Cont Jardiance.  Blood sugar 101-127 on lab   Hyperlipidemia Continue ezetimibe -simvastatin    OSA Patient had not recently been on CPAP after it broke earlier this year in Feb-has been trying to get a replacement, but has not been able to yet.Ordered CPAP nightly here. Fu w/ PCP.   GERD Continue omeprazole  Morbid Obesity w/ Body mass index is 58.57 kg/m.: Will benefit with PCP follow-up, weight loss,healthy lifestyle and outpatient Mounjaro  Mobility: PT Orders: Active  PT Follow up Rec: Skilled Nursing-Short Term Rehab (<3 Hours/Day)05/17/2024 1100    DVT prophylaxis: Eliquis  Code Status:   Code Status: Full Code Family Communication: plan of care discussed with patient at bedside. Patient status is: Remains hospitalized because of severity of illness Level of care: Telemetry   Dispo: The patient is from: home            Anticipated disposition: SNF once cleared by cardio  Objective: Vitals last 24 hrs: Vitals:   05/17/24 2353 05/18/24 0353 05/18/24 0841 05/18/24 0843  BP: 131/64 127/84 130/73 130/73   Pulse: 97 86 83 80  Resp: 20 18 (!) 22   Temp: 98 F (36.7 C) 97.8 F (36.6 C) 98.5 F (36.9 C)   TempSrc: Oral Oral Oral   SpO2: 99% 96% 95%   Weight:  (!) 164.6 kg    Height:        Physical Examination: General exam:  AAOX3 , obese pleasant NAD HEENT:Oral mucosa moist, Ear/Nose WNL grossly Respiratory system: Bilaterally diminished at basese, no use of accessory muscle Cardiovascular system: S1 & S2 +, No JVD. Gastrointestinal system: Abdomen soft, obese,ND, BS+ Nervous System: Alert, awake, moving all extremities,and following commands. Extremities: extremities warm, leg edema trace Skin: Warm, no rashes MSK: Normal muscle bulk,tone, power   Medications reviewed:  Scheduled Meds:  apixaban   5 mg Oral BID   ezetimibe   10 mg Oral QHS   And   simvastatin   40 mg Oral QHS   Fe Fum-Vit C-Vit B12-FA  1 capsule Oral Weekly   metoprolol succinate  50 mg Oral Daily   pantoprazole   40 mg Oral Daily   sodium chloride  flush  3 mL Intravenous Once   sodium chloride  flush  3 mL Intravenous Q12H   Continuous Infusions: Diet: Diet Order  Diet heart healthy/carb modified Room service appropriate? Yes; Fluid consistency: Thin  Diet effective now                    Data Reviewed: I have personally reviewed following labs and imaging studies ( see epic result tab) CBC: Recent Labs  Lab 05/14/24 0416 05/15/24 0420 05/16/24 0943 05/17/24 0650 05/18/24 0403  WBC 9.3 6.9 6.7 7.6 6.5  NEUTROABS 6.9 4.6 4.3 4.4 4.2  HGB 9.3* 9.5* 9.5* 9.9* 9.6*  HCT 30.3* 31.0* 31.2* 31.6* 31.1*  MCV 84.2 83.3 84.6 82.9 83.2  PLT 226 232 235 261 248   CMP: Recent Labs  Lab 05/14/24 0416 05/15/24 0420 05/16/24 0943 05/17/24 0650 05/18/24 0403  NA 141 141 138 139 138  K 3.9 4.1 3.9 4.6 4.4  CL 100 98 97* 99 101  CO2 30 30 30 28 25   GLUCOSE 104* 101* 120* 116* 120*  BUN 24* 24* 32* 35* 35*  CREATININE 1.58* 1.60* 2.33* 2.42* 2.43*  CALCIUM 8.8* 8.9 8.8* 9.1 9.1    GFR: Estimated Creatinine Clearance: 36.5 mL/min (A) (by C-G formula based on SCr of 2.43 mg/dL (H)). Recent Labs  Lab 05/11/24 2026  AST 24  ALT 19  ALKPHOS 94  BILITOT 0.4  PROT 6.6  ALBUMIN 3.7   No results for input(s): LIPASE, AMYLASE in the last 168 hours. No results for input(s): AMMONIA in the last 168 hours. Coagulation Profile: No results for input(s): INR, PROTIME in the last 168 hours. Unresulted Labs (From admission, onward)    None      Antimicrobials/Microbiology: Anti-infectives (From admission, onward)    None         Component Value Date/Time   SDES  12/05/2023 2229    URINE, RANDOM Performed at [facility], [address] [place], [place] [address]    [facility]  12/05/2023 2229    NONE Reflexed from [id number] Performed at [facility], [address] [place], [place] [address]    CULT >=100,000 COLONIES/mL ESCHERICHIA COLI (A) 12/05/2023 2229   REPTSTATUS 12/08/2023 FINAL 12/05/2023 2229    Procedures:    Mennie LAMY, MD [facility] 05/18/2024, 11:32 AM

## 2024-05-18 NOTE — Progress Notes (Signed)
 Lost IV access this AM. MD notified, and per MD pt still needs IV access. 6E RN attempted IV access twice, unsuccessful. IV team consulted and questioned if pt needed IV access. IV team stated they wanted to wait on placing IV because there were no medications needed at the time. IV team advised that pt still needed IV access, order updated to STAT for pt to receive access.

## 2024-05-18 NOTE — Plan of Care (Signed)
  Problem: Clinical Measurements: Goal: Will remain free from infection Outcome: Progressing Goal: Diagnostic test results will improve Outcome: Progressing Goal: Respiratory complications will improve Outcome: Progressing Goal: Cardiovascular complication will be avoided Outcome: Progressing   Problem: Activity: Goal: Risk for activity intolerance will decrease Outcome: Progressing   Problem: Nutrition: Goal: Adequate nutrition will be maintained Outcome: Progressing   Problem: Coping: Goal: Level of anxiety will decrease Outcome: Progressing   Problem: Elimination: Goal: Will not experience complications related to bowel motility Outcome: Progressing Goal: Will not experience complications related to urinary retention Outcome: Progressing   Problem: Pain Managment: Goal: General experience of comfort will improve and/or be controlled Outcome: Progressing   Problem: Safety: Goal: Ability to remain free from injury will improve Outcome: Progressing

## 2024-05-18 NOTE — Plan of Care (Signed)

## 2024-05-18 NOTE — Progress Notes (Signed)
 Physical Therapy Treatment Patient Details Name: Joan [name] MRN: [id number] DOB: 11/29/1955 Today's Date: 05/18/2024   History of Present Illness 68 y.o. female presents with SOB for 3 weeks and chest pain, fluid retention, with medical history significant of hypertension, diastolic congestive heart failure, diabetes mellitus type 2, history of DVT, morbid obesity, OSA presents with shortness of breath and fluid buildup.    PT Comments  Pt making good progress with ambulation this afternoon, was able to significantly progress total ambulation distance and without any seated rest. Pt does continue to need modA to rise to standing and cues for use of UE to power up, will benefit from continued skilled PT acutely. Pt making good progress towards d/c home with HHPT, recommendations updated as family present and confirms they will be able to provide some assist as needed.     If plan is discharge home, recommend the following: Assistance with cooking/housework;Help with stairs or ramp for entrance;A lot of help with walking and/or transfers;A lot of help with bathing/dressing/bathroom   Can travel by private vehicle     No  Equipment Recommendations   (Bariatric RW)    Recommendations for Other Services       Precautions / Restrictions Precautions Precautions: Fall Recall of Precautions/Restrictions: Intact Restrictions Weight Bearing Restrictions Per Provider Order: No     Mobility  Bed Mobility Overal bed mobility: Needs Assistance             General bed mobility comments: pt OOB at start and end of session    Transfers Overall transfer level: Needs assistance Equipment used: Rolling walker (2 wheels) Transfers: Sit to/from Stand Sit to Stand: Mod assist           General transfer comment: cues for hand placement, modA to power up to standing    Ambulation/Gait Ambulation/Gait assistance: Min assist, Contact guard assist Gait Distance (Feet): 100 Feet (+  60 ft + 40 ft) Assistive device: Rolling walker (2 wheels) Gait Pattern/deviations: Decreased step length - right, Decreased step length - left, Decreased stride length, Step-to pattern Gait velocity: decreased Gait velocity interpretation: <1.31 ft/sec, indicative of household ambulator   General Gait Details: maintains trunk flexion (improved from last session) and x2 standing rest breaks, no seated rest.  SpO2 stable on 2L    Balance Overall balance assessment: Needs assistance Sitting-balance support: No upper extremity supported, Feet supported Sitting balance-Leahy Scale: Good     Standing balance support: Bilateral upper extremity supported, During functional activity, Reliant on assistive device for balance Standing balance-Leahy Scale: Poor Standing balance comment: dependent on UE support                            Communication Communication Communication: No apparent difficulties  Cognition Arousal: Alert Behavior During Therapy: WFL for tasks assessed/performed                             Following commands: Intact      Cueing Cueing Techniques: Verbal cues  Exercises      General Comments General comments (skin integrity, edema, etc.): VSS on 2L      Pertinent Vitals/Pain Pain Assessment Pain Assessment: No/denies pain    Home Living                          Prior Function  PT Goals (current goals can now be found in the care plan section) Acute Rehab PT Goals Patient Stated Goal: HOME, pool therapy, find help to get a power w/c transport for the car. PT Goal Formulation: With patient Time For Goal Achievement: 05/28/24 Progress towards PT goals: Progressing toward goals    Frequency    Min 3X/week      PT Plan      Co-evaluation              AM-PAC PT 6 Clicks Mobility   Outcome Measure  Help needed turning from your back to your side while in a flat bed without using bedrails?: A  Little Help needed moving from lying on your back to sitting on the side of a flat bed without using bedrails?: A Little Help needed moving to and from a bed to a chair (including a wheelchair)?: A Lot Help needed standing up from a chair using your arms (e.g., wheelchair or bedside chair)?: A Lot Help needed to walk in hospital room?: A Lot Help needed climbing 3-5 steps with a railing? : Total 6 Click Score: 13    End of Session Equipment Utilized During Treatment: Oxygen Activity Tolerance: Patient tolerated treatment well;Patient limited by fatigue Patient left: with call bell/phone within reach;in chair;with family/visitor present Nurse Communication: Mobility status PT Visit Diagnosis: Difficulty in walking, not elsewhere classified (R26.2);Other abnormalities of gait and mobility (R26.89)     Time: 8496-8467 PT Time Calculation (min) (ACUTE ONLY): 29 min  Charges:    $Gait Training: 8-22 mins $Therapeutic Exercise: 8-22 mins PT General Charges $$ ACUTE PT VISIT: 1 Visit                     Izetta Call, PT, DPT   Acute Rehabilitation Department Office [phone] Secure Chat Communication Preferred   Izetta JULIANNA Call 05/18/2024, 4:13 PM

## 2024-05-18 NOTE — TOC Progression Note (Signed)
 Transition of Care [facility]) - Progression Note    Patient Details  Name: Joan [name] MRN: [id number] Date of Birth: 1956-05-08  Transition of Care [facility]) CM/SW Contact  Graves-Bigelow, Erminio Deems, RN Phone Number: 05/18/2024, 4:01 PM  Clinical Narrative: ICM spoke with patient regarding her CPAP. Patient states it has not been working since Feb. and it is in her car that is getting repaired in [place]. ICM spoke with Delonda with [employer] and she is willing to switch it out with a loaner machine once the family delivers it to the office in [place]. Son is willing to pick it up tomorrow and deliver it to the [place] office. ICM discussed  arranging HH PT/Aide for the patient and the patient wants to use [facility] for Services. Referral submitted to [facility] and start of care to begin within 24-48 hours post transition home. No further needs identified at this time.     Expected Discharge Plan: Home w Home Health Services Barriers to Discharge: No Barriers Identified  Expected Discharge Plan and Services   Discharge Planning Services: CM Consult Post Acute Care Choice: Home Health Living arrangements for the past 2 months: Apartment                 DME Arranged: Bedside commode, Walker rolling (Both bariatric)   Date DME Agency Contacted: 05/14/24 Time DME Agency Contacted: [phone] Representative spoke with at DME Agency: Zack HH Arranged: PT, Nurse's Aide HH Agency: [facility] Health Care Date [facility] Agency Contacted: 05/18/24 Time HH Agency Contacted: 1601 Representative spoke with at [facility] Agency: Darleene  Social Drivers of Health (SDOH) Interventions SDOH Screenings   Food Insecurity: No Food Insecurity (05/14/2024)  Housing: Low Risk  (05/14/2024)  Transportation Needs: No Transportation Needs (05/13/2024)  Utilities: Not At Risk (05/14/2024)  Alcohol Screen: Low Risk  (05/13/2024)  Depression (PHQ2-9): Low Risk  (11/29/2020)  Financial Resource Strain: Low Risk  (05/13/2024)  Social Connections:  Unknown (05/14/2024)  Tobacco Use: Low Risk  (05/14/2024)    Readmission Risk Interventions     No data to display

## 2024-05-18 NOTE — Progress Notes (Signed)
   Heart Failure Stewardship Pharmacist Progress Note   PCP: Pcp, No PCP-Cardiologist: Jennifer JONELLE Crape, MD    HPI:  68 yo F with PMH of T2DM, HTN, HLD, OSA, DVT/PE, CKD III, and CHF.   Presented to the ED on 10/28 with worsening shortness of breath and LE edema. BNP elevated. CXR with no acute findings, stable cardiomegaly. CTA negative for PE. ECHO 10/29 with LVEF 45-50% (60-65% in 11/2023), no RWMA, G1DD, RV mildly reduced, moderate MR.   Reviewed changes to GDMT and goals of therapy. Does not have a pill box at home but will get one. Reports taking all medications (including Eliquis ) at one time per day. She was taking both Eliquis  tablets at the same time of the day so she wouldn't forget to take the evening dose. Education provided.   Denies shortness of breath. Able to lay flat. Tells me she hasn't used a CPAP in several months because hers was broken. States the CPAP was uncomfortable last night. Difficult to assess volume status with body habitus. Creatinine remains elevated but stable from yesterday.   Current HF Medications: Beta Blocker: metoprolol XL 50 mg daily  Prior to admission HF Medications: Diuretic: furosemide  40 mg daily ACE/ARB/ARNI: losartan/hctz 100/25 mg daily SGLT2i: Jardiance 10 mg daily  Pertinent Lab Values: Serum creatinine 2.43, BUN 35, Potassium 4.4, Sodium 138, BNP 4971   Vital Signs: Weight: 362 lbs (admission weight: 387 lbs) Blood pressure: 130/60s  Heart rate: 80s  I/O: net +0.6L yesterday; net -10L since admission  Medication Assistance / Insurance Benefits Check: Does the patient have prescription insurance?  Yes Type of insurance plan: [employer]  Outpatient Pharmacy:  Prior to admission outpatient pharmacy: [facility] Is the patient willing to use [facility] TOC pharmacy at discharge? Yes Is the patient willing to transition their outpatient pharmacy to utilize a [facility] outpatient pharmacy?   No    Assessment: 1. Acute on chronic  diastolic CHF (LVEF 45-50%). NYHA class II symptoms. - Off IV lasix  with AKI. Difficult to assess volume status with body habitus. Strict I/Os and daily weights. Keep K>4 and Mg>2.  - Continue metoprolol XL 50 mg daily - decreased dose due to bradycardia - Holding losartan, spironolactone, and Jardiance with AKI - Holding hydralazine with episodes of low BP   Plan: 1) Medication changes recommended at this time: - Agree with holding GDMT with AKI  2) Patient assistance: - None pending  3)  Education  - Patient has been educated on current HF medications and potential additions to HF medication regimen - Patient verbalizes understanding that over the next few months, these medication doses may change and more medications may be added to optimize HF regimen - Patient has been educated on basic disease state pathophysiology and goals of therapy - Educated on the importance of taking medications twice daily and reviewed strategies to help with compliance   Duwaine Plant, PharmD, BCPS Heart Failure Stewardship Pharmacist Phone [phone]

## 2024-05-19 DIAGNOSIS — I5033 Acute on chronic diastolic (congestive) heart failure: Secondary | ICD-10-CM | POA: Diagnosis not present

## 2024-05-19 DIAGNOSIS — N179 Acute kidney failure, unspecified: Secondary | ICD-10-CM | POA: Diagnosis not present

## 2024-05-19 DIAGNOSIS — R0989 Other specified symptoms and signs involving the circulatory and respiratory systems: Secondary | ICD-10-CM

## 2024-05-19 HISTORY — DX: Other specified symptoms and signs involving the circulatory and respiratory systems: R09.89

## 2024-05-19 LAB — CBC
HCT: 30.8 % — ABNORMAL LOW (ref 36.0–46.0)
Hemoglobin: 9.4 g/dL — ABNORMAL LOW (ref 12.0–15.0)
MCH: 25.9 pg — ABNORMAL LOW (ref 26.0–34.0)
MCHC: 30.5 g/dL (ref 30.0–36.0)
MCV: 84.8 fL (ref 80.0–100.0)
Platelets: 202 K/uL (ref 150–400)
RBC: 3.63 MIL/uL — ABNORMAL LOW (ref 3.87–5.11)
RDW: 14.6 % (ref 11.5–15.5)
WBC: 6.8 K/uL (ref 4.0–10.5)
nRBC: 0 % (ref 0.0–0.2)

## 2024-05-19 LAB — BASIC METABOLIC PANEL WITH GFR
Anion gap: 13 (ref 5–15)
BUN: 36 mg/dL — ABNORMAL HIGH (ref 8–23)
CO2: 26 mmol/L (ref 22–32)
Calcium: 9 mg/dL (ref 8.9–10.3)
Chloride: 100 mmol/L (ref 98–111)
Creatinine, Ser: 2.12 mg/dL — ABNORMAL HIGH (ref 0.44–1.00)
GFR, Estimated: 25 mL/min — ABNORMAL LOW (ref >60)
Glucose, Bld: 100 mg/dL — ABNORMAL HIGH (ref 70–99)
Potassium: 4.4 mmol/L (ref 3.5–5.1)
Sodium: 139 mmol/L (ref 135–145)

## 2024-05-19 NOTE — Progress Notes (Signed)
 PT placed on CPAP at this time.   05/19/24 0021  BiPAP/CPAP/SIPAP  $ Non-Invasive Home Ventilator  Subsequent  BiPAP/CPAP/SIPAP Pt Type Adult  BiPAP/CPAP/SIPAP Resmed  Mask Type Nasal mask  Dentures removed? Not applicable  Mask Size Large  Respiratory Rate 26 breaths/min  PEEP 9 cmH20  FiO2 (%) 21 %  Flow Rate 0 lpm  Patient Home Machine No  Patient Home Mask No  Patient Home Tubing No  Auto Titrate Yes  Minimum cmH2O 5 cmH2O  Maximum cmH2O 15 cmH2O  Nasal massage performed Yes  CPAP/SIPAP surface wiped down Yes  Device Plugged into [employer] Power Outlet Yes

## 2024-05-19 NOTE — Progress Notes (Addendum)
 Patient seen and examined, note reviewed with the signed Advanced Practice Provider. I personally reviewed laboratory data, imaging studies and relevant notes. I independently examined the patient and formulated the important aspects of the plan. I have personally discussed the plan with the patient and/or family. Comments or changes to the note/plan are indicated below.  She feels stronger today, slept better, using CPAP more easily. Reviewed labs, plan, see below.  Exam with no acute distress. Unable to visualize JVD 2/2 body habitus. Lungs distant but clear. Regular S1/S2, no murmurs appreciated. No pitting edema, bilateral firm LE edema.   Acute on chronic diastolic heart failure Acute kidney injury on chronic kidney disease stage 3b -diuresis held due to elevated creatinine, peak 2.43 11/4. BUN 35.  -echo this admission with mildly reduced EF 45-50% -was on empagliflozin (holding), hydralazine (see below), losartan (held for AKI), metoprolol, spironolactone (holding) for GDMT -with improving renal function, will hold on RHC for now -admission weight 175.8 kg, current weight 164.3 kg, charted net negative 11 L -at visit with Dr. Edwyna 11/11/33, weight was 175.5 kg but had been 156.5 kg about a week prior.  -reviewed heart failure education, including diet, fluid recommendations   Hypertension:  Historically hypertensive, but was hypotensive last few days.  -Losartan, spironolactone held due to AKI.  -had held standing hydralazine given intermittent low BP, ordered PRN for systolic above 160 -blood pressure now rising.  If remains elevated, will add back hydralazine 25 mg TID  Paroxysmal SVT: has had a few episodes of brief SVT in the last 24 hours, but no pauses. 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 No PE on CT this admission.   OSA: continue CPAP   Type II  diabetes: on SGLT2i as outpatient, held due to AKI. On GLP as outpatient given diabetes, heart failure, obesity, sleep apnea  Overall as renal function continues to improve, will work to add back GDMT. Difficult to assess by exam, but overall picture suggests she is euvolemic.  Joan Bruckner, MD, PhD, [facility] [place]  [facility]  [place]  [facility] [address], [address] [place], [place] [address] [phone]        Progress Note  Patient Name: Joan [name] Date of Encounter: 05/19/2024 [facility] Cardiologist: [name], MD   Interval Summary   Sitting up in the chair during the interview.  Denies any chest pain or shortness of breath.  Said that she is also feeling better overall and able to ambulate better.  Reported that she was able to tolerate the CPAP machine better yet last night after the pressure was decreased.  Vital Signs Vitals:   05/18/24 2333 05/19/24 0418 05/19/24 0545 05/19/24 0808  BP: (!) 145/81 128/77    Pulse: 75 80 78   Resp: 20 20  17   Temp: 98.3 F (36.8 C) 97.6 F (36.4 C)  97.7 F (36.5 C)  TempSrc: Oral Axillary  Oral  SpO2: 100% 92% 97%   Weight:   (!) 164.3 kg   Height:        Intake/Output Summary (Last 24 hours) at 05/19/2024 0856 Last data filed at 05/19/2024 0645 Gross per 24 hour  Intake --  Output 1175 ml  Net -1175 ml      05/19/2024    5:45 AM 05/18/2024    3:53  AM 05/17/2024    5:00 AM  Last 3 Weights  Weight (lbs) 362 lb 3.2 oz 362 lb 14.4 oz 335 lb 15.7 oz  Weight (kg) 164.293 kg 164.61 kg 152.4 kg      Telemetry/ECG  Underlying rhythm sinus with heart rates in the 70s to 80s.  Had a few brief episodes of SVT with heart rates in the 120s.  There are less episodes than the patient had yesterday- Personally Reviewed  Physical Exam  GEN: No acute distress.  Alert and orientated on room air. Neck: Unable to assess JVD due  to body habitus Cardiac: RRR, 2/6 systolic murmur on upper sternal boarder, no rubs, or gallops.  Respiratory: Clear to auscultation bilaterally. GI: Soft, nontender, non-distended  MS: 1+ bilateral lower extremity edema  Assessment & Plan   Acute on chronic HFpEF Moderate MR Mild AAS Echocardiogram showed a moderately reduced LVEF of 45 to 50%, no RWMA, mild concentric LVH, G1 DD, mildly reduced RV systolic function, normal pulmonary artery systolic pressure, moderately dilated left and right atrium, moderate MR, moderate MAC, aortic valve calcification, and mild aortic stenosis Current weight is 164.3 kgs this is down from 179.2 on admission.  I's and O's are net out 10 L.  On 05/16/24 creatinine rose.  It was suspected that this was secondary to overdiuresis.  Because of this the patient's IV Lasix  was held.  The creatinine has remained elevated over the past few days but today is down slightly at 2.12.  No need for right heart cath given creatinine is improving Will need echocardiogram in 6 to 12 months to monitor aortic stenosis. Continue to hold IV Lasix . May consider starting 40 mg oral Lasix  as needed for weight gain, worsening lower extremity edema, or worsening shortness of breath.  Will need outpatient basic metabolic panel 1 week after discharge. GDMT Hold MRA and SGLT2 due to concerns of AKI.  If AKI continues to improve may consider restarting tomorrow. Continue metoprolol succinate 50 mg daily     Hypertension Blood pressure continues to be well-managed.  Most recent BP at 4 AM was 128/77.  Will continue to hold losartan and will hold MRA due to ongoing AKI as per GDMT above.  Continue as needed hydralazine.     OSA recommend continuing CPAP use.  Recommend decreasing CPAP pressure as it appears like she is getting air in her stomach when CPAP is at full pressure.     Hyperlipidemia No coronary calcifications mentioned on CT angiography. Continue simvastatin  40 mg  daily. Continue Zetia  10 mg daily.     Anemia Hemoglobin 9.4.  Normocytic.  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     Paroxysmal SVT Runs of SVT are asymptomatic.  The patient has had less frequent runs of paroxysmal SVT on telemetry in the past 24 hours.  Suspect that metoprolol and CPAP compliance likely contributed to this. Continue metoprolol succinate 50 mg daily     Type 2 diabetes Hemoglobin A1c 5.9 in 2022. Hold SGLT2 as above On Mounjaro in the outpatient setting.  Otherwise management per primary   For questions or updates, please contact [facility] Please consult [email or web address] for contact info under        Signed, Zane Adams, PA-C

## 2024-05-19 NOTE — Plan of Care (Signed)

## 2024-05-19 NOTE — Progress Notes (Signed)
 Occupational Therapy Treatment Patient Details Name: Joan [name] MRN: [id number] DOB: 1956-03-03 Today's Date: 05/19/2024   History of present illness 68 y.o. female presents with SOB for 3 weeks and chest pain, fluid retention, with medical history significant of hypertension, diastolic congestive heart failure, diabetes mellitus type 2, history of DVT, morbid obesity, OSA presents with shortness of breath and fluid buildup.   OT comments  Pt making good progress with functional goals. Recommend updated d/c venue to home with [facility] therapies. OT will continue to follow acutely to maximize level of function and safety       If plan is discharge home, recommend the following:  Assistance with cooking/housework;Assist for transportation;Help with stairs or ramp for entrance;A little help with walking and/or transfers;A lot of help with bathing/dressing/bathroom   Equipment Recommendations   (bari BSC, RW)    Recommendations for Other Services      Precautions / Restrictions Precautions Precautions: Fall Recall of Precautions/Restrictions: Intact Restrictions Weight Bearing Restrictions Per Provider Order: No       Mobility Bed Mobility               General bed mobility comments: pt in chair upon arrival    Transfers Overall transfer level: Needs assistance Equipment used: Rolling walker (2 wheels) Transfers: Sit to/from Stand Sit to Stand: Mod assist, Min assist Stand pivot transfers: Min assist   Step pivot transfers: Min assist     General transfer comment: cues for hand placement, mod A to power up to standing from chair, min A from [facility]     Balance Overall balance assessment: Needs assistance Sitting-balance support: No upper extremity supported, Feet supported Sitting balance-Leahy Scale: Good     Standing balance support: Bilateral upper extremity supported, During functional activity, Reliant on assistive device for balance Standing balance-Leahy  Scale: Poor                             ADL either performed or assessed with clinical judgement   ADL Overall ADL's : Needs assistance/impaired     Grooming: Wash/dry hands;Wash/dry face;Contact guard assist;Standing   Upper Body Bathing: Set up;Supervision/ safety;Sitting   Lower Body Bathing: Moderate assistance;Sitting/lateral leans   Upper Body Dressing : Supervision/safety;Set up;Sitting       Toilet Transfer: Minimal assistance;Cueing for safety;Rolling walker (2 wheels);Stand-pivot;BSC/3in1   Toileting- Architect and Hygiene: Sit to/from stand;Moderate assistance       Functional mobility during ADLs: Moderate assistance;Minimal assistance;Rolling walker (2 wheels);Cueing for safety      Extremity/Trunk Assessment Upper Extremity Assessment Upper Extremity Assessment: Generalized weakness;Overall WFL for tasks assessed   Lower Extremity Assessment Lower Extremity Assessment: Defer to PT evaluation        Vision Ability to See in Adequate Light: 0 Adequate Patient Visual Report: No change from baseline     Perception     Praxis     Communication Communication Communication: No apparent difficulties   Cognition Arousal: Alert Behavior During Therapy: WFL for tasks assessed/performed Cognition: No apparent impairments                               Following commands: Intact        Cueing   Cueing Techniques: Verbal cues  Exercises      Shoulder Instructions       General Comments      Pertinent Vitals/ Pain  Pain Assessment Pain Assessment: No/denies pain  Home Living                                          Prior Functioning/Environment              Frequency  Min 2X/week        Progress Toward Goals  OT Goals(current goals can now be found in the care plan section)  Progress towards OT goals: Progressing toward goals     Plan      Co-evaluation                  AM-PAC OT 6 Clicks Daily Activity     Outcome Measure   Help from another person eating meals?: None Help from another person taking care of personal grooming?: A Little Help from another person toileting, which includes using toliet, bedpan, or urinal?: A Lot Help from another person bathing (including washing, rinsing, drying)?: A Lot Help from another person to put on and taking off regular upper body clothing?: A Little Help from another person to put on and taking off regular lower body clothing?: A Lot 6 Click Score: 16    End of Session Equipment Utilized During Treatment: Gait belt;Rolling walker (2 wheels);Other (comment) (BSC)  OT Visit Diagnosis: Unsteadiness on feet (R26.81);Other abnormalities of gait and mobility (R26.89);Muscle weakness (generalized) (M62.81)   Activity Tolerance Patient tolerated treatment well   Patient Left with call bell/phone within reach;in chair   Nurse Communication Mobility status        Time: 1121-1150 OT Time Calculation (min): 29 min  Charges: OT General Charges $OT Visit: 1 Visit OT Treatments $Self Care/Home Management : 8-22 mins $Therapeutic Activity: 8-22 mins    Jacques Karna Loose 05/19/2024, 1:07 PM

## 2024-05-19 NOTE — Progress Notes (Signed)
   Heart Failure Stewardship Pharmacist Progress Note   PCP: Pcp, No PCP-Cardiologist: Jennifer JONELLE Crape, MD    HPI:  68 yo F with PMH of T2DM, HTN, HLD, OSA, DVT/PE, CKD III, and CHF.   Presented to the ED on 10/28 with worsening shortness of breath and LE edema. BNP elevated. CXR with no acute findings, stable cardiomegaly. CTA negative for PE. ECHO 10/29 with LVEF 45-50% (60-65% in 11/2023), no RWMA, G1DD, RV mildly reduced, moderate MR.   Reviewed changes to GDMT and goals of therapy. Does not have a pill box at home but will get one. Reports taking all medications (including Eliquis ) at one time per day. She was taking both Eliquis  tablets at the same time of the day so she wouldn't forget to take the evening dose. Education provided.   Denies shortness of breath and chest pain. Able to lay flat. Tells me she hasn't used a CPAP in several months because hers was broken. Has been wearing one for sleep in the hospital and is adjusting. Difficult to assess volume status with body habitus. Creatinine improving.   Current HF Medications: Beta Blocker: metoprolol XL 50 mg daily  Prior to admission HF Medications: Diuretic: furosemide  40 mg daily ACE/ARB/ARNI: losartan/hctz 100/25 mg daily SGLT2i: Jardiance 10 mg daily  Pertinent Lab Values: Serum creatinine 2.43>2.12, BUN 36, Potassium 4.4, Sodium 139, BNP 4971   Vital Signs: Weight: 362 lbs (admission weight: 387 lbs) Blood pressure: 130-140/80s  Heart rate: 70s  I/O: net -0.9L yesterday; net -11.1L since admission  Medication Assistance / Insurance Benefits Check: Does the patient have prescription insurance?  Yes Type of insurance plan: [employer]  Outpatient Pharmacy:  Prior to admission outpatient pharmacy: [facility] Is the patient willing to use [facility] TOC pharmacy at discharge? Yes Is the patient willing to transition their outpatient pharmacy to utilize a [facility] outpatient pharmacy?   No    Assessment: 1. Acute  on chronic diastolic CHF (LVEF 45-50%). NYHA class II symptoms. - Off IV lasix  with AKI. Difficult to assess volume status with body habitus. Strict I/Os and daily weights. Keep K>4 and Mg>2.  - Continue metoprolol XL 50 mg daily - decreased dose due to bradycardia - Holding losartan, spironolactone, and Jardiance with AKI - Holding hydralazine with episodes of low BP   Plan: 1) Medication changes recommended at this time: - No changes  2) Patient assistance: - None pending  3)  Education  - Patient has been educated on current HF medications and potential additions to HF medication regimen - Patient verbalizes understanding that over the next few months, these medication doses may change and more medications may be added to optimize HF regimen - Patient has been educated on basic disease state pathophysiology and goals of therapy - Educated on the importance of taking medications twice daily and reviewed strategies to help with compliance   Duwaine Plant, PharmD, BCPS Heart Failure Stewardship Pharmacist Phone [phone]

## 2024-05-19 NOTE — Progress Notes (Signed)
 Mobility Specialist Progress Note:   05/19/24 1437  Mobility  Activity Ambulated with assistance  Level of Assistance Standby assist, set-up cues, supervision of patient - no hands on  Assistive Device Front wheel walker  Distance Ambulated (ft) 200 ft  Activity Response Tolerated well  Mobility Referral Yes  Mobility visit 1 Mobility  Mobility Specialist Start Time (ACUTE ONLY) 1437  Mobility Specialist Stop Time (ACUTE ONLY) 1457  Mobility Specialist Time Calculation (min) (ACUTE ONLY) 20 min   Pt received in chair. Agreeable to mobility session. Ambulated in hallway with RW. SpO2 93%> on 3L. Tolerated well, audible SOB throughout and c/o of knee pain. Took 2 standing rest breaks throughout d/t fatigue. Returned to room, sitting EOB. RN notified.   Skylar Flynt Mobility Specialist Please contact via Special Educational Needs Teacher or  Rehab office at [phone]

## 2024-05-19 NOTE — Progress Notes (Signed)
 PROGRESS NOTE Joan [name]  FMW:[id number] DOB: Feb 16, 1956 DOA: 05/11/2024 PCP: Pcp, No  Brief Narrative/Hospital Course: Joan [name] is a 68 y.o. female with PMH of hypertension, diastolic congestive heart failure, diabetes mellitus type 2, history of DVT, morbid obesity, OSA presents with shortness of breath x  3 weeks and on 10/28 night, she had mild chest pain located in the upper chest area, which she thinks might be related to reflux. She is not on oxygen therapy. She has been dealing with fluid retention that has persisted despite taking 40 mg of Lasix  for seven days.She describes issues with her lymphatic system, noting that the fluid is not moving. She is not currently on oxygen but has been using a CPAP machine, which broke back in February. Despite efforts, she has not received a replacement CPAP machine. She receives Jelsyn three shots every six months for an unspecified condition, as she does not take cortisone. In the ZI:jqzampoz with pulse 73-1 41, respirations 17-39, blood pressures elevated up to 179/115, and O2 saturations currently maintained on 4 L nasal cannula oxygen.  Labs significant for BNP 4971, high-sensitivity troponins 43 ->43, hemoglobin 8.9, BUN 20, and creatinine 1.42.  Chest x-ray showed no acute abnormality CT angiogram of the chest noted no pulmonary embolism with enlarged main pulmonary artery measuring 3.3 cm and cardiomegaly. 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 With creatinine trending up diuretics held and since BP was soft GDMT also held-not discussed ongoing regarding right heart cath> given creatinine slightly improving cardiology deferring RHC Difficult to assess volume status due to body habitus. Net netive balance Net IO Since Admission: -11,189 mL [05/19/24 0904] Per Epic- at her  visit with Dr. Edwyna 11/11/33, weight was 175.5 kg but had been 156.5 kg about a week prior Cont to monitor daily I/O,weight, electrolytes.Keep on  salt/fluid restricted diet and monitor in tele.  Filed Weights   05/17/24 0500 05/18/24 0353 05/19/24 0545  Weight: (!) 152.4 kg (!) 164.6 kg (!) 164.3 kg    Recent Labs  Lab 05/15/24 0420 05/16/24 0943 05/17/24 0650 05/18/24 0403 05/19/24 0513  BUN 24* 32* 35* 35* 36*  CREATININE 1.60* 2.33* 2.42* 2.43* 2.12*  K 4.1 3.9 4.6 4.4 4.4   Intake/Output Summary (Last 24 hours) at 05/19/2024 9095 Last data filed at 05/19/2024 0645 Gross per 24 hour  Intake --  Output 1175 ml  Net -1175 ml    SVT vs AT w/ episode of bradycardia during sleep Had PSVT- converted to NSR overnight 11/2: Atrial tachycardia suspected, also bradycardic during sleep likely from sleep apnea. Tolerating Toprol 50 so far.  Monitor on telemetry Encouraged use of CPAP. Continue to monitor on telemetry   Essential hypertension: On GDMT as above.  Cardiology following continue metoprolol at reduced dose  Elevated troponin: Rather flat -likely  demand ischemia due to CHF  AKI on CKD3b: Creat1.42 on  admission. B/Lpreviously ~1.2-creatinine trending up in the setting of CHF/need for diuretics, GDMT, soft blood pressure GDMT discontinued, creatinine slightly better diuretics on hold. Monitor renal func, avoid nephrotoxic medication,  dehydration hypotension, renally dose meds. Recent Labs    12/05/23 2221 05/11/24 2026 05/12/24 1317 05/13/24 0238 05/14/24 0416 05/15/24 0420 05/16/24 0943 05/17/24 0650 05/18/24 0403 05/19/24 0513  BUN 31* 20 16 19  24* 24* 32* 35* 35* 36*  CREATININE 1.25* 1.42* 1.27* 1.60* 1.58* 1.60* 2.33* 2.42* 2.43* 2.12*  CO2 25 27 26 29 30 30 30 28 25  26  K 4.0 3.5 3.5 4.0 3.9 4.1 3.9 4.6 4.4 4.4   Normocytic anemia: Monitor hb-baseline ~8 to 9 g, suspect in the setting of CKD.monitor. Recent Labs  Lab 05/15/24 0420 05/16/24 0943 05/17/24 0650 05/18/24 0403 05/19/24 0513  HGB 9.5* 9.5* 9.9* 9.6* 9.4*  HCT 31.0* 31.2* 31.6* 31.1* 30.8*    History of DVT/PE on chronic anticoagulation since 01/2020: Continue Eliquis    Prolonged QT interval 538. Avoid QT prolonging medications. Correct any electrolyte abnormalities   T2DM without long-term use of insulin: Last available hemoglobin A1c noted to be 5.3 when checked 7 months ago.Cont Jardiance.  Blood sugar 101-127 on lab   Hyperlipidemia Continue ezetimibe -simvastatin    OSA Patient had not recently been on CPAP after it broke earlier this year in Feb-has been trying to get a replacement, but has not been able to yet.Ordered CPAP nightly here. Fu w/ PCP.   GERD Continue omeprazole  Morbid Obesity w/ Body mass index is 58.46 kg/m.: Will benefit with PCP follow-up, weight loss,healthy lifestyle and outpatient Mounjaro  Mobility: PT Orders: Active  PT Follow up Rec: Home Health Pt11/10/2023 1600    DVT prophylaxis: Eliquis  Code Status:   Code Status: Full Code Family Communication: plan of care discussed with patient at bedside. Patient status is: Remains hospitalized because of severity of illness Level of care: Telemetry   Dispo: The patient is from: home            Anticipated disposition: SNF once cleared by cardio  Objective: Vitals last 24 hrs: Vitals:   05/18/24 2333 05/19/24 0418 05/19/24 0545 05/19/24 0808   BP: (!) 145/81 128/77    Pulse: 75 80 78   Resp: 20 20  17   Temp: 98.3 F (36.8 C) 97.6 F (36.4 C)  97.7 F (36.5 C)  TempSrc: Oral Axillary  Oral  SpO2: 100% 92% 97%   Weight:   (!) 164.3 kg   Height:        Physical Examination: General exam:  AAOX3  HEENT:Oral mucosa moist, Ear/Nose WNL grossly Respiratory system: Bilaterally diminished at basese, no use of accessory muscle Cardiovascular system: S1 & S2 +, No JVD. Gastrointestinal system: Abdomen soft, obese,ND, BS+ Nervous System: Alert, awake, moving all extremities,and following commands. Extremities: extremities warm, leg edema chronic appearing Skin: Warm, no rashes MSK: Normal muscle bulk,tone, power   Medications reviewed:  Scheduled Meds:  apixaban   5 mg Oral BID   ezetimibe   10 mg Oral QHS   And   simvastatin   40 mg Oral QHS   Fe Fum-Vit C-Vit B12-FA  1 capsule Oral Weekly   metoprolol succinate  50 mg Oral Daily   pantoprazole   40 mg Oral Daily   sodium chloride  flush  3 mL Intravenous Once   sodium chloride  flush  3 mL Intravenous Q12H   Continuous Infusions: Diet: Diet Order             Diet heart  healthy/carb modified Room service appropriate? Yes; Fluid consistency: Thin  Diet effective now                    Data Reviewed: I have personally reviewed following labs and imaging studies ( see epic result tab) CBC: Recent Labs  Lab 05/14/24 0416 05/15/24 0420 05/16/24 0943 05/17/24 0650 05/18/24 0403 05/19/24 0513  WBC 9.3 6.9 6.7 7.6 6.5 6.8  NEUTROABS 6.9 4.6 4.3 4.4 4.2  --   HGB 9.3* 9.5* 9.5* 9.9* 9.6* 9.4*  HCT 30.3* 31.0* 31.2* 31.6* 31.1* 30.8*  MCV 84.2 83.3 84.6 82.9 83.2 84.8  PLT 226 232 235 261 248 202   CMP: Recent Labs  Lab 05/15/24 0420 05/16/24 0943 05/17/24 0650 05/18/24 0403 05/19/24 0513  NA 141 138 139 138 139  K 4.1 3.9 4.6 4.4 4.4  CL 98 97* 99 101 100  CO2 30 30 28 25 26   GLUCOSE 101* 120* 116* 120* 100*  BUN 24* 32* 35* 35* 36*  CREATININE 1.60*  2.33* 2.42* 2.43* 2.12*  CALCIUM 8.9 8.8* 9.1 9.1 9.0   GFR: Estimated Creatinine Clearance: 41.7 mL/min (A) (by C-G formula based on SCr of 2.12 mg/dL (H)). No results for input(s): AST, ALT, ALKPHOS, BILITOT, PROT, ALBUMIN in the last 168 hours.  No results for input(s): LIPASE, AMYLASE in the last 168 hours. No results for input(s): AMMONIA in the last 168 hours. Coagulation Profile: No results for input(s): INR, PROTIME in the last 168 hours. Unresulted Labs (From admission, onward)     Start     Ordered   05/19/24 0500  CBC  Daily,   R     Question:  Specimen collection method  Answer:  Lab=Lab collect   05/19/24 0457   05/19/24 0500  Basic metabolic panel with GFR  Daily,   R     Question:  Specimen collection method  Answer:  Lab=Lab collect   05/19/24 0457           Antimicrobials/Microbiology: Anti-infectives (From admission, onward)    None         Component Value Date/Time   SDES  12/05/2023 2229    URINE, RANDOM Performed at [facility], [address], [place], [place] [address]    [facility]  12/05/2023 2229    NONE Reflexed from [id number] Performed at [facility], [address] [place], [place] [address]    CULT >=100,000 COLONIES/mL ESCHERICHIA COLI (A) 12/05/2023 2229   REPTSTATUS 12/08/2023 FINAL 12/05/2023 2229    Procedures:    Mennie LAMY, MD [facility] 05/19/2024, 10:49 AM

## 2024-05-20 DIAGNOSIS — R0989 Other specified symptoms and signs involving the circulatory and respiratory systems: Secondary | ICD-10-CM | POA: Diagnosis not present

## 2024-05-20 DIAGNOSIS — N179 Acute kidney failure, unspecified: Secondary | ICD-10-CM | POA: Diagnosis not present

## 2024-05-20 DIAGNOSIS — I5033 Acute on chronic diastolic (congestive) heart failure: Secondary | ICD-10-CM | POA: Diagnosis not present

## 2024-05-20 LAB — CBC
HCT: 31 % — ABNORMAL LOW (ref 36.0–46.0)
Hemoglobin: 9.4 g/dL — ABNORMAL LOW (ref 12.0–15.0)
MCH: 25.7 pg — ABNORMAL LOW (ref 26.0–34.0)
MCHC: 30.3 g/dL (ref 30.0–36.0)
MCV: 84.7 fL (ref 80.0–100.0)
Platelets: 218 K/uL (ref 150–400)
RBC: 3.66 MIL/uL — ABNORMAL LOW (ref 3.87–5.11)
RDW: 14.2 % (ref 11.5–15.5)
WBC: 7 K/uL (ref 4.0–10.5)
nRBC: 0 % (ref 0.0–0.2)

## 2024-05-20 LAB — BASIC METABOLIC PANEL WITH GFR
Anion gap: 10 (ref 5–15)
BUN: 39 mg/dL — ABNORMAL HIGH (ref 8–23)
CO2: 27 mmol/L (ref 22–32)
Calcium: 8.7 mg/dL — ABNORMAL LOW (ref 8.9–10.3)
Chloride: 101 mmol/L (ref 98–111)
Creatinine, Ser: 2.07 mg/dL — ABNORMAL HIGH (ref 0.44–1.00)
GFR, Estimated: 26 mL/min — ABNORMAL LOW (ref >60)
Glucose, Bld: 100 mg/dL — ABNORMAL HIGH (ref 70–99)
Potassium: 4.4 mmol/L (ref 3.5–5.1)
Sodium: 138 mmol/L (ref 135–145)

## 2024-05-20 NOTE — Progress Notes (Signed)
   Heart Failure Stewardship Pharmacist Progress Note   PCP: Pcp, No PCP-Cardiologist: Jennifer JONELLE Crape, MD    HPI:  68 yo F with PMH of T2DM, HTN, HLD, OSA, DVT/PE, CKD III, and CHF.   Presented to the ED on 10/28 with worsening shortness of breath and LE edema. BNP elevated. CXR with no acute findings, stable cardiomegaly. CTA negative for PE. ECHO 10/29 with LVEF 45-50% (60-65% in 11/2023), no RWMA, G1DD, RV mildly reduced, moderate MR.   Reviewed changes to GDMT and goals of therapy. Does not have a pill box at home but will get one. Reports taking all medications (including Eliquis ) at one time per day. She was taking both Eliquis  tablets at the same time of the day so she wouldn't forget to take the evening dose. Education provided.   Denies shortness of breath. Laying flat during conversation today. Tells me she hasn't used a CPAP in several months because hers was broken. Has been wearing one for sleep in the hospital and is adjusting. Difficult to assess volume status with body habitus. Creatinine slowly improving. She wants to make sure her PCP, Dr. Leonce and Dr. Wendie with [facility] are notified of the changes that are occurring in the hospital.   Current HF Medications: Beta Blocker: metoprolol XL 50 mg daily  Prior to admission HF Medications: Diuretic: furosemide  40 mg daily ACE/ARB/ARNI: losartan/hctz 100/25 mg daily SGLT2i: Jardiance 10 mg daily  Pertinent Lab Values: Serum creatinine 2.43>2.12>2.07, BUN 39, Potassium 4.4, Sodium 138, BNP 4971   Vital Signs: Weight: 363 lbs (admission weight: 387 lbs) Blood pressure: 130-140/80s  Heart rate: 70s  I/O: net -0.2L yesterday; net -11.5L since admission  Medication Assistance / Insurance Benefits Check: Does the patient have prescription insurance?  Yes Type of insurance plan: [employer]  Outpatient Pharmacy:  Prior to admission outpatient pharmacy: [facility] Is the patient willing to use [facility] TOC  pharmacy at discharge? Yes Is the patient willing to transition their outpatient pharmacy to utilize a [facility] outpatient pharmacy?   No    Assessment: 1. Acute on chronic diastolic CHF (LVEF 45-50%). NYHA class II symptoms. - Off IV lasix  with AKI. Difficult to assess volume status with body habitus. Strict I/Os and daily weights. Keep K>4 and Mg>2.  - Continue metoprolol XL 50 mg daily - decreased dose due to bradycardia - Holding losartan, spironolactone, and Jardiance with AKI - Holding standing hydralazine 100 mg TID with prior episodes of low BP - PRN hydral ordered. Consider restarting at 25 mg TID  Plan: 1) Medication changes recommended at this time: - Restart hydralazine at 25 mg TID  2) Patient assistance: - None pending  3)  Education  - Patient has been educated on current HF medications and potential additions to HF medication regimen - Patient verbalizes understanding that over the next few months, these medication doses may change and more medications may be added to optimize HF regimen - Patient has been educated on basic disease state pathophysiology and goals of therapy - Educated on the importance of taking medications twice daily and reviewed strategies to help with compliance   Duwaine Plant, PharmD, BCPS Heart Failure Stewardship Pharmacist Phone [phone]

## 2024-05-20 NOTE — TOC Progression Note (Addendum)
 Transition of Care [facility]) - Progression Note    Patient Details  Name: Joan [name] MRN: [id number] Date of Birth: 23-Apr-1956  [facility] of [facility]) CM/SW Contact  Graves-Bigelow, Erminio Deems, RN Phone Number: 05/20/2024, 11:30 AM  Clinical Narrative: ICM spoke with patient regarding CPAP. Patient states son will go to get the CPAP from the car and deliver it to the hospital after 4:00pm. ICM has reached out [employer] to see if a loaner can be delivered to the hospital. ICM will continue to follow for additional needs.     Expected Discharge Plan: Home w Home Health Services Barriers to Discharge: No Barriers Identified  Expected Discharge Plan and Services   Discharge Planning Services: CM Consult Post Acute Care Choice: Home Health Living arrangements for the past 2 months: Apartment                 DME Arranged: Bedside commode, Walker rolling (Both bariatric)   Date DME Agency Contacted: 05/14/24 Time DME Agency Contacted: [phone] Representative spoke with at DME Agency: Zack HH Arranged: PT, Nurse's Aide HH Agency: [facility] Date [facility] Agency Contacted: 05/18/24 Time HH Agency Contacted: 1601 Representative spoke with at [facility] Agency: Darleene   Social Drivers of Health (SDOH) Interventions SDOH Screenings   Food Insecurity: No Food Insecurity (05/14/2024)  Housing: Low Risk  (05/14/2024)  Transportation Needs: No Transportation Needs (05/13/2024)  Utilities: Not At Risk (05/14/2024)  Alcohol Screen: Low Risk  (05/13/2024)  Depression (PHQ2-9): Low Risk  (11/29/2020)  Financial Resource Strain: Low Risk  (05/13/2024)  Social Connections: Unknown (05/14/2024)  Tobacco Use: Low Risk  (05/14/2024)    Readmission Risk Interventions     No data to display

## 2024-05-20 NOTE — Progress Notes (Signed)
 PROGRESS NOTE Joan [name]  FMW:[id number] DOB: 19-May-1956 DOA: 05/11/2024 PCP: Pcp, No  Brief Narrative/Hospital Course: Joan [name] is a 68 y.o. female with PMH of hypertension, diastolic congestive heart failure, diabetes mellitus type 2, history of DVT, morbid obesity, OSA presents with shortness of breath x  3 weeks and on 10/28 night, she had mild chest pain located in the upper chest area, which she thinks might be related to reflux. She is not on oxygen therapy. She has been dealing with fluid retention that has persisted despite taking 40 mg of Lasix  for seven days.She describes issues with her lymphatic system, noting that the fluid is not moving. She is not currently on oxygen but has been using a CPAP machine, which broke back in February. Despite efforts, she has not received a replacement CPAP machine. She receives Jelsyn three shots every six months for an unspecified condition, as she does not take cortisone. In the ZI:jqzampoz with pulse 73-1 41, respirations 17-39, blood pressures elevated up to 179/115, and O2 saturations currently maintained on 4 L nasal cannula oxygen.  Labs significant for BNP 4971, high-sensitivity troponins 43 ->43, hemoglobin 8.9, BUN 20, and creatinine 1.42.  Chest x-ray showed no acute abnormality CT angiogram of the chest noted no pulmonary embolism with enlarged main pulmonary artery measuring 3.3 cm and cardiomegaly. Patient admitted cardiology consulted and managed with aggressive diuresis/GDMT> which was discontinued due to elevated creatinine and hypotension. Anticipate discharge pending creatinine improvement  Subjective: Seen and examined Overall feels much improved.  No new complaints  Overnight febrile BP stable labs with creatinine further downtrending 2.4> 2.12> 2.07 Weight 395/387 lb>362>363  She has been able to walk more over the past day and able to lay flat in the bed  Assessment and plan:  Acute on chronic combined mild  systolic and diastolic congestive heart failure: Presenting with worsening shortness of breath, fluid overload and abnormal BNP 4971-CT negative for PE.TTE-EF 45-50% mildly decreased function no RWMA G1DD- Cardiology on board input appreciated  S/o IV diuresis,GDMT- w/ Toprol, losartan, Aldactone and hydralazine- but Lasix  and GDMT stopped due to soft bp/AKI> Cont Toprol 50 only. Given creatinine is improving cardiology deferred RHC, needs TTE 6-12 months to monitor aortic stenosis Difficult to assess volume status due to body habitus. Net netive balance Net IO Since Admission: -11,549 mL [05/20/24 1149] Per Epic- at her  visit with Dr. Edwyna 11/11/33, weight was 175.5 kg but had been 156.5 kg about a week prior Cont to monitor daily I/O,weight, electrolytes.Keep on  salt/fluid restricted diet and monitor in tele.  Once cleared by cardiology plan for discharge. Filed Weights   05/18/24 0353 05/19/24 0545 05/20/24 0509  Weight: (!) 164.6 kg (!) 164.3 kg (!) 164.9 kg    Recent Labs  Lab 05/16/24 0943 05/17/24 0650 05/18/24 0403 05/19/24 0513 05/20/24 0432  BUN 32* 35* 35* 36* 39*  CREATININE 2.33* 2.42* 2.43* 2.12* 2.07*  K 3.9 4.6 4.4 4.4 4.4   Intake/Output Summary (Last 24 hours) at 05/20/2024 1149 Last data filed at 05/20/2024 9185 Gross per 24 hour  Intake 840 ml  Output 1200 ml  Net -360 ml    SVT vs AT w/ episode of bradycardia during sleep Had PSVT- converted to NSR overnight 11/2: Atrial tachycardia suspected, also bradycardic during sleep likely from sleep apnea. Tolerating Toprol 50 so far.  Monitor on telemetry, rncouraged use of CPAP. Continue to monitor on telemetry   Essential hypertension: On GDMT as above.  Cardiology following continue  metoprolol at reduced dose  Elevated troponin: Rather flat -likely  demand ischemia due to CHF  AKI on CKD3b: Creat1.42 on admission. B/Lpreviously ~1.2-AKI 2/2 CHF/hypotension and diuresis/GDMT Antihypertensive meds Lasix  and  GDMT discontinued, creatinine slowly downtrending Recent Labs    05/11/24 2026 05/12/24 1317 05/13/24 0238 05/14/24 0416 05/15/24 0420 05/16/24 0943 05/17/24 0650 05/18/24 0403 05/19/24 0513 05/20/24 0432  BUN 20 16 19  24* 24* 32* 35* 35* 36* 39*  CREATININE 1.42* 1.27* 1.60* 1.58* 1.60* 2.33* 2.42* 2.43* 2.12* 2.07*  CO2 27 26 29 30 30 30 28 25 26  27  K 3.5 3.5 4.0 3.9 4.1 3.9 4.6 4.4 4.4 4.4   Normocytic anemia: Monitor hb-baseline ~8 to 9 g, suspect in the setting of CKD.monitor. Recent Labs  Lab 05/16/24 0943 05/17/24 0650 05/18/24 0403 05/19/24 0513 05/20/24 0432  HGB 9.5* 9.9* 9.6* 9.4* 9.4*  HCT 31.2* 31.6* 31.1* 30.8* 31.0*    History of DVT/PE on chronic anticoagulation since 01/2020: Continue Eliquis    Prolonged QT interval 538. Avoid QT prolonging medications. Correct any electrolyte abnormalities   T2DM without long-term use of insulin: Last available hemoglobin A1c noted to be 5.3 when checked 7 months ago.Cont Jardiance.  Blood sugar 101-127 on lab   Hyperlipidemia: Continue ezetimibe -simvastatin    OSA: Patient had not recently been on CPAP after it broke earlier this year in Feb-has been trying to get a replacement, but has not been able to yet.Ordered CPAP nightly here.  In the process to switch CPAP by [facility] per TOC this admission   GERD: Continue omeprazole  Morbid Obesity w/ Body mass index is 58.67 kg/m.: Will benefit with PCP follow-up, weight loss,healthy lifestyle and outpatient Mounjaro  Mobility: PT Orders: Active  PT Follow up Rec: Home Health Pt11/10/2023 1600    DVT prophylaxis: Eliquis  Code Status:   Code Status: Full Code Family Communication: plan of care discussed with patient at bedside. Patient status is: Remains hospitalized because of severity of illness Level of care: Telemetry   Dispo: The patient is from: home            Anticipated disposition: Initially SNF recommended now recommending home health [facility] in  24 hrs  Objective: Vitals last 24 hrs: Vitals:   05/20/24 0742 05/20/24 0809 05/20/24 0813 05/20/24 1126  BP: (!) 142/78 129/80 129/80 115/70  Pulse: 76 77 73 68  Resp: 19  20 18   Temp: 98.1 F (36.7 C)  98 F (36.7 C) 97.8 F (36.6 C)  TempSrc: Oral  Oral Oral  SpO2: 97%  100% 97%  Weight:      Height:       Physical Examination: General exam:  AAOX3  HEENT:Oral mucosa moist, Ear/Nose WNL grossly Respiratory system: Bilaterally diminished breath sounds on the lungs.   Cardiovascular system: S1 & S2 +, No JVD. Gastrointestinal system: Abdomen soft, obese,ND, BS+ Nervous System: Alert, awake, moving all extremities,and following commands. Extremities: extremities warm, leg edema chronic appearing Skin: Warm, no rashes MSK: Normal muscle bulk,tone, power   Medications reviewed:  Scheduled Meds:  apixaban   5 mg Oral BID   ezetimibe   10 mg Oral QHS   And   simvastatin   40 mg Oral QHS   Fe Fum-Vit C-Vit B12-FA  1 capsule Oral Weekly   metoprolol succinate  50 mg Oral Daily   pantoprazole   40 mg Oral Daily   sodium chloride  flush  3 mL Intravenous Once   sodium chloride  flush  3 mL Intravenous Q12H   Continuous Infusions:  Diet: Diet Order             Diet heart healthy/carb modified Room service appropriate? Yes; Fluid consistency: Thin  Diet effective now                    Data Reviewed: I have personally reviewed following labs and imaging studies ( see epic result tab) CBC: Recent Labs  Lab 05/14/24 0416 05/15/24 0420 05/16/24 0943 05/17/24 0650 05/18/24 0403 05/19/24 0513 05/20/24 0432  WBC 9.3 6.9 6.7 7.6 6.5 6.8 7.0  NEUTROABS 6.9 4.6 4.3 4.4 4.2  --   --   HGB 9.3* 9.5* 9.5* 9.9* 9.6* 9.4* 9.4*  HCT 30.3* 31.0* 31.2* 31.6* 31.1* 30.8* 31.0*  MCV 84.2 83.3 84.6 82.9 83.2 84.8 84.7  PLT 226 232 235 261 248 202 218   CMP: Recent Labs  Lab 05/16/24 0943 05/17/24 0650 05/18/24 0403 05/19/24 0513 05/20/24 0432  NA 138 139 138 139 138  K 3.9  4.6 4.4 4.4 4.4  CL 97* 99 101 100 101  CO2 30 28 25 26 27   GLUCOSE 120* 116* 120* 100* 100*  BUN 32* 35* 35* 36* 39*  CREATININE 2.33* 2.42* 2.43* 2.12* 2.07*  CALCIUM 8.8* 9.1 9.1 9.0 8.7*   GFR: Estimated Creatinine Clearance: 42.8 mL/min (A) (by C-G formula based on SCr of 2.07 mg/dL (H)). No results for input(s): AST, ALT, ALKPHOS, BILITOT, PROT, ALBUMIN in the last 168 hours.  No results for input(s): LIPASE, AMYLASE in the last 168 hours. No results for input(s): AMMONIA in the last 168 hours. Coagulation Profile: No results for input(s): INR, PROTIME in the last 168 hours. Unresulted Labs (From admission, onward)     Start     Ordered   05/19/24 0500  CBC  Daily,   R     Question:  Specimen collection method  Answer:  Lab=Lab collect   05/19/24 0457   05/19/24 0500  Basic metabolic panel with GFR  Daily,   R     Question:  Specimen collection method  Answer:  Lab=Lab collect   05/19/24 0457           Antimicrobials/Microbiology: Anti-infectives (From admission, onward)    None         Component Value Date/Time   SDES  12/05/2023 2229    URINE, RANDOM Performed at [facility], [address] [place], [place] [address]    [facility]  12/05/2023 2229    NONE Reflexed from [id number] Performed at [facility], [address] [place], [place] [address]    CULT >=100,000 COLONIES/mL ESCHERICHIA COLI (A) 12/05/2023 2229   REPTSTATUS 12/08/2023 FINAL 12/05/2023 2229    Procedures:    Mennie LAMY, MD [facility] 05/20/2024, 11:50 AM

## 2024-05-20 NOTE — Progress Notes (Signed)
 PT placed on CPAP at this time.   05/20/24 2314  BiPAP/CPAP/SIPAP  BiPAP/CPAP/SIPAP Pt Type Adult  BiPAP/CPAP/SIPAP Resmed  Mask Type Nasal mask  Dentures removed? Not applicable  Mask Size Large  Set Rate 0 breaths/min  IPAP 15 cmH20  EPAP 5 cmH2O  FiO2 (%) 21 %  Flow Rate 0 lpm  Minute Ventilation 15.1  Leak 0  Peak Inspiratory Pressure (PIP) 0  Tidal Volume (Vt) 721  Patient Home Machine No  Patient Home Mask No  Patient Home Tubing No  Auto Titrate Yes  Minimum cmH2O 5 cmH2O  Maximum cmH2O 15 cmH2O  Nasal massage performed Yes  CPAP/SIPAP surface wiped down Yes  Device Plugged into RED Power Outlet Yes

## 2024-05-20 NOTE — Plan of Care (Signed)

## 2024-05-20 NOTE — Progress Notes (Signed)
 Physical Therapy Treatment Patient Details Name: MERLINDA WRUBEL MRN: [id number] DOB: 19-May-1956 Today's Date: 05/20/2024   History of Present Illness 68 y.o. female presents with SOB for 3 weeks and chest pain, fluid retention, with medical history significant of hypertension, diastolic congestive heart failure, diabetes mellitus type 2, history of DVT, morbid obesity, OSA presents with shortness of breath and fluid buildup.    PT Comments  Pt hasn't quite met all goals yet.  Discussed needing L knee support, search for example, Pt has a bed rail she had for her mother to put on her bed, Emphasis otherwise on STS from various heights and gait stability/stamina.     If plan is discharge home, recommend the following: Assistance with cooking/housework;Help with stairs or ramp for entrance;A lot of help with walking and/or transfers;A lot of help with bathing/dressing/bathroom   Can travel by private vehicle     No  Equipment Recommendations  None recommended by PT    Recommendations for Other Services       Precautions / Restrictions Precautions Recall of Precautions/Restrictions: Intact     Mobility  Bed Mobility Overal bed mobility: Needs Assistance Bed Mobility: Supine to Sit     Supine to sit: HOB elevated, Used rails, Contact guard, Min assist          Transfers Overall transfer level: Needs assistance Equipment used: Rolling walker (2 wheels) Transfers: Sit to/from Stand Sit to Stand: Contact guard assist, Min assist (min for lower surfaces) Stand pivot transfers: Contact guard assist         General transfer comment: cues for hand placement, mod A to power up to standing from chair, min A from [facility]    Ambulation/Gait Ambulation/Gait assistance: Contact guard assist Gait Distance (Feet): 180 Feet (total with 4 standing rest propped on elbows) Assistive device: Rolling walker (2 wheels) Gait Pattern/deviations: Decreased step length - right, Decreased  step length - left, Decreased stride length, Step-to pattern   Gait velocity interpretation: <1.8 ft/sec, indicate of risk for recurrent falls   General Gait Details: generally steady with the RW.  Able to attain an upright posture, but difficult to sustain for long.  On 3l [place], pt tolerated the gait trial well in the lower 90's, but in upper 90's at conclusion of session.   Stairs             Wheelchair Mobility     Tilt Bed    Modified Rankin (Stroke Patients Only)       Balance Overall balance assessment: Needs assistance   Sitting balance-Leahy Scale: Good       Standing balance-Leahy Scale: Poor Standing balance comment: dependent on UE support                            Communication Communication Communication: No apparent difficulties  Cognition Arousal: Alert Behavior During Therapy: WFL for tasks assessed/performed   PT - Cognitive impairments: No apparent impairments                         Following commands: Intact      Cueing Cueing Techniques: Verbal cues  Exercises Other Exercises Other Exercises: sit-stand from EOB x4    General Comments        Pertinent Vitals/Pain Pain Assessment Pain Assessment: Faces Faces Pain Scale: Hurts little more Pain Location: L knee Pain Descriptors / Indicators: Sore (popping) Pain Intervention(s): Monitored during session  Home Living                          Prior Function            PT Goals (current goals can now be found in the care plan section) Acute Rehab PT Goals Patient Stated Goal: HOME, pool therapy, find help to get a power w/c transport for the car. PT Goal Formulation: With patient Time For Goal Achievement: 05/28/24 Progress towards PT goals: Progressing toward goals    Frequency    Min 3X/week      PT Plan      Co-evaluation              AM-PAC PT 6 Clicks Mobility   Outcome Measure  Help needed turning from your back to  your side while in a flat bed without using bedrails?: A Little Help needed moving from lying on your back to sitting on the side of a flat bed without using bedrails?: A Little Help needed moving to and from a bed to a chair (including a wheelchair)?: A Little Help needed standing up from a chair using your arms (e.g., wheelchair or bedside chair)?: A Little Help needed to walk in hospital room?: A Little Help needed climbing 3-5 steps with a railing? : Total 6 Click Score: 16    End of Session Equipment Utilized During Treatment: Oxygen Activity Tolerance: Patient tolerated treatment well;Patient limited by fatigue Patient left: in bed;with call bell/phone within reach;with family/visitor present (EOB) Nurse Communication: Mobility status PT Visit Diagnosis: Difficulty in walking, not elsewhere classified (R26.2);Other abnormalities of gait and mobility (R26.89)     Time: 8192-8163 PT Time Calculation (min) (ACUTE ONLY): 29 min  Charges:    $Gait Training: 8-22 mins $Therapeutic Activity: 8-22 mins PT General Charges $$ ACUTE PT VISIT: 1 Visit                     05/20/2024  India HERO., PT Acute Rehabilitation Services [phone]  (office)   Vinie GAILS Nikiesha Milford 05/20/2024, 7:18 PM

## 2024-05-20 NOTE — Progress Notes (Addendum)
 Patient seen and examined, note reviewed with the signed Advanced Practice Provider. I personally reviewed laboratory data, imaging studies and relevant notes. I independently examined the patient and formulated the important aspects of the plan. I have personally discussed the plan with the patient and/or family. Comments or changes to the note/plan are indicated below.   She feels good, notes that she tolerates O2 better than CPAP. Wasn't on home O2 prior to admission.  She reports ambulating and O2 sats dropped, needing her to be on 3L. Will confirm plan for this with team.   Exam with no acute distress. Unable to visualize JVD 2/2 body habitus. Lungs distant but clear. Regular S1/S2, no murmurs appreciated. No pitting edema, bilateral firm LE edema.   Acute on chronic diastolic heart failure Acute kidney injury on chronic kidney disease stage 3b -diuresis held due to elevated creatinine, peak 2.43 11/4. Cr now 2.07  -echo this admission with mildly reduced EF 45-50% -was on empagliflozin (holding), hydralazine (see below), losartan (held for AKI), metoprolol, spironolactone (holding) for GDMT -admission weight 175.8 kg, current weight 164.9 kg, charted net negative 11 L -at visit with Dr. Edwyna 11/11/33, weight was 175.5 kg but had been 156.5 kg about a week prior.  -reviewed heart failure education, including diet, fluid recommendations   Hypertension:  Historically hypertensive, but was hypotensive last few days.  -Losartan, spironolactone held due to AKI.  -had held standing hydralazine given intermittent low BP, ordered PRN for systolic above 160 -blood pressure has been labile, most recent 115/70. Given this, will not add back hydralazine at this time   Paroxysmal SVT: intermittent/brief. Continue metoprolol   Mild aortic stenosis: follow as outpatient   Hyperlipidemia: continue simvastatin , ezetimibe    History of DVT: on apixaban . No PE on CT this admission.   OSA: continue  CPAP   Type II diabetes: on SGLT2i as outpatient, held due to AKI. On GLP as outpatient given diabetes, heart failure, obesity, sleep apnea   [facility] will sign off.   Medication Recommendations:   Apixaban  5 mg BID Metoprolol succinate 50 mg daily Ezetimibe -Simvastatin  10-40 mg daily Furosemide  40 mg daily PRN for weight gain 5 lbs   HOLD losartan-hydrochlorothiazide , hydralazine Other recommendations (labs, testing, etc):  Spironolactone used this admission, tolerated until AKI, could re-trial if renal function continues to improve and BP allows at follow up Follow up as an outpatient:  Keep appt on 05/24/24 with HF impact clinic   [name], MD, PhD, [facility]  [facility]  [place]  Heart & Vascular at [facility] [address], [address] [place], [place] [address] [phone]          Progress Note  Patient Name: Joan [name] Date of Encounter: 05/20/2024  HeartCare Cardiologist: [name], MD   Interval Summary   Was laying flat on bed during the interview. Reported that she has been able to walk more over the past day and that she is feeling stronger.  Has difficulty with CPAP patient reported that she suspects that this is possibly due to dryness and that there is no option for water in the hospital CPAP.  Last night she slept wearing 3 L nasal cannula.  Vital Signs Vitals:   05/19/24 1929 05/19/24 2325 05/20/24 0325 05/20/24 0509  BP: (!) 147/88 119/61 138/73   Pulse: 80 80 84   Resp: 20 (!) 22 (!) 24   Temp: 97.8 F (36.6 C) 97.9 F (36.6  C) 98 F (36.7 C)   TempSrc: Oral Oral Oral   SpO2: 100% 97% 90%   Weight:    (!) 164.9 kg  Height:        Intake/Output Summary (Last 24 hours) at 05/20/2024 0730 Last data filed at 05/20/2024 0500 Gross per 24 hour  Intake 480 ml  Output 900 ml  Net -420 ml      05/20/2024    5:09 AM 05/19/2024    5:45 AM 05/18/2024     3:53 AM  Last 3 Weights  Weight (lbs) 363 lb 8 oz 362 lb 3.2 oz 362 lb 14.4 oz  Weight (kg) 164.883 kg 164.293 kg 164.61 kg      Telemetry/ECG  Underlying rhythm is sinus with heart rates in the 70s to 80s.  Overnight has had multiple brief runs of SVT- Personally Reviewed  Physical Exam  GEN: No acute distress.  Alert and orientated.  Wearing 3 L nasal cannula Neck: Unable to assess JVD due to body habitus Cardiac: RRR, 2 out of 6 systolic murmur on upper sternal border.  No rubs, or gallops.  Respiratory: Clear to auscultation bilaterally. GI: Soft, nontender, non-distended  MS: No edema  Assessment & Plan   Acute on chronic HFmrEF Moderate MR Mild AAS Echocardiogram showed a moderately reduced LVEF of 45 to 50%, no RWMA, mild concentric LVH, G1 DD, mildly reduced RV systolic function, normal pulmonary artery systolic pressure, moderately dilated left and right atrium, moderate MR, moderate MAC, aortic valve calcification, and mild aortic stenosis Current weight is 164.3 kgs this is down from 179.2 on admission.  I's and O's are net out 10 L.  On 05/16/24 creatinine rose.  It was suspected that this was secondary to overdiuresis.  Because of this the patient's IV Lasix  was held.  The creatinine has remained elevated over the past few days but today is down slightly at 2.07.  No need for right heart cath given creatinine is improving Will need echocardiogram in 6 to 12 months to monitor aortic stenosis. Continue to hold IV Lasix . Has follow up appointment with Heart impact clinic on 05/24/24. GDMT Hold MRA, ARB/ARNI and SGLT2 due to concerns of AKI. May consider restarting if AKI has resolved. Continue metoprolol succinate 50 mg daily. Titration limited by bradycardia.     Hypertension Blood pressure has increased over the past day but better today.  Most recent BP at 3:25 AM was 138/73.  Will continue to hold losartan and will hold MRA due to ongoing AKI as per GDMT above.        OSA recommend continuing CPAP use. CPAP working better.     Hyperlipidemia No coronary calcifications mentioned on CT angiography. Continue simvastatin  40 mg daily. Continue Zetia  10 mg daily.     Anemia Hemoglobin 9.4.  Normocytic.  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     Paroxysmal SVT Runs of SVT are asymptomatic.  The patient has had less frequent runs of paroxysmal SVT on telemetry in the past 24 hours.  Suspect that metoprolol and CPAP compliance likely contributed to this. Continue metoprolol succinate 50 mg daily     Obesity Type 2 diabetes Hemoglobin A1c 5.9 in 2022. Hold SGLT2 as above On Mounjaro in the outpatient setting. Will also help heart failure, HTN, OSA, and obesity.   Otherwise management per primary   [facility] will likely sign off.   The patient is ready  for discharge today from a cardiac standpoint. Medication Recommendations:  metoprolol succinate 50 mg daily, Continue Zetia  10 mg daily, Continue simvastatin  40 mg daily, continue Eliquis  5 mg twice daily, Will discuss with MD starting amlodipine vs hydralazine for blood pressure management. Other recommendations (labs, testing, etc):  BMP to check renal function Follow up as an outpatient:  follow up with Heart impact clinic on 05/24/24 For questions or updates, please contact [facility] Please consult [email or web address] for contact info under       Signed, Zane Adams, PA-C

## 2024-05-21 ENCOUNTER — Other Ambulatory Visit (HOSPITAL_COMMUNITY): Payer: Self-pay

## 2024-05-21 DIAGNOSIS — I5033 Acute on chronic diastolic (congestive) heart failure: Secondary | ICD-10-CM | POA: Diagnosis not present

## 2024-05-21 LAB — BASIC METABOLIC PANEL WITH GFR
Anion gap: 10 (ref 5–15)
BUN: 32 mg/dL — ABNORMAL HIGH (ref 8–23)
CO2: 25 mmol/L (ref 22–32)
Calcium: 8.4 mg/dL — ABNORMAL LOW (ref 8.9–10.3)
Chloride: 103 mmol/L (ref 98–111)
Creatinine, Ser: 1.86 mg/dL — ABNORMAL HIGH (ref 0.44–1.00)
GFR, Estimated: 30 mL/min — ABNORMAL LOW (ref >60)
Glucose, Bld: 95 mg/dL (ref 70–99)
Potassium: 4.4 mmol/L (ref 3.5–5.1)
Sodium: 138 mmol/L (ref 135–145)

## 2024-05-21 LAB — CBC
HCT: 29.4 % — ABNORMAL LOW (ref 36.0–46.0)
Hemoglobin: 9 g/dL — ABNORMAL LOW (ref 12.0–15.0)
MCH: 26 pg (ref 26.0–34.0)
MCHC: 30.6 g/dL (ref 30.0–36.0)
MCV: 85 fL (ref 80.0–100.0)
Platelets: 214 K/uL (ref 150–400)
RBC: 3.46 MIL/uL — ABNORMAL LOW (ref 3.87–5.11)
RDW: 14.1 % (ref 11.5–15.5)
WBC: 6.8 K/uL (ref 4.0–10.5)
nRBC: 0 % (ref 0.0–0.2)

## 2024-05-21 MED ORDER — LORATADINE 10 MG PO TABS
10.0000 mg | ORAL_TABLET | Freq: Every day | ORAL | 0 refills | Status: AC | PRN
Start: 2024-05-21 — End: ?
  Filled 2024-05-21: qty 15, 15d supply, fill #0

## 2024-05-21 MED ORDER — FUROSEMIDE 20 MG PO TABS: 40.0000 mg | ORAL_TABLET | Freq: Every day | ORAL | Status: DC | PRN

## 2024-05-21 MED ORDER — LORATADINE 10 MG PO TABS
10.0000 mg | ORAL_TABLET | Freq: Every day | ORAL | Status: DC
  Administered 2024-05-21: 10 mg via ORAL
  Filled 2024-05-21: qty 1

## 2024-05-21 MED ORDER — METOPROLOL SUCCINATE ER 50 MG PO TB24
50.0000 mg | ORAL_TABLET | Freq: Every day | ORAL | 0 refills | Status: DC
  Filled 2024-05-21: qty 30, 30d supply, fill #0

## 2024-05-21 NOTE — TOC Progression Note (Addendum)
 Transition of Care [facility]) - Progression Note    Patient Details  Name: Joan [name] MRN: [id number] Date of Birth: 05/02/1956  Transition of Care [facility]) CM/SW Contact  Graves-Bigelow, Erminio Deems, RN Phone Number: 05/21/2024, 9:42 AM  Clinical Narrative:  ICM notified [employer] regarding the CPAP. [employer] Liaison will pick up the CPAP from the hospital today and have a technician work on it. Per [employer], the technician may not be able to work on it today and get it back to the patent. ICM did make the staff RN and MD aware. ICM will continue to follow for oxygen needs for home.     1132 Mitch with [employer] picked up the CPAP for service. DME bariatric RW and BSC to be delivered to the room. If patient needs nocturnal oxygen; patient will need an overnight pulse oximetry. MD is aware and Weekend CM added to the secure chat. No further needs as this time.   Expected Discharge Plan: Home w Home Health Services Barriers to Discharge: No Barriers Identified  Expected Discharge Plan and Services   Discharge Planning Services: CM Consult Post Acute Care Choice: Home Health Living arrangements for the past 2 months: Apartment                 DME Arranged: Bedside commode, Walker rolling (Both bariatric)   Date DME Agency Contacted: 05/14/24 Time DME Agency Contacted: [phone] Representative spoke with at DME Agency: Zack HH Arranged: PT, Nurse's Aide HH Agency: [facility] Date [facility] Agency Contacted: 05/18/24 Time HH Agency Contacted: 1601 Representative spoke with at [facility] Agency: Darleene  Social Drivers of Health (SDOH) Interventions SDOH Screenings   Food Insecurity: No Food Insecurity (05/14/2024)  Housing: Low Risk  (05/14/2024)  Transportation Needs: No Transportation Needs (05/13/2024)  Utilities: Not At Risk (05/14/2024)  Alcohol Screen: Low Risk  (05/13/2024)  Depression (PHQ2-9): Low Risk  (11/29/2020)  Financial Resource Strain: Low Risk  (05/13/2024)  Social Connections:  Unknown (05/14/2024)  Tobacco Use: Low Risk  (05/14/2024)    Readmission Risk Interventions     No data to display

## 2024-05-21 NOTE — TOC CM/SW Note (Signed)
 Transition of Care (TOC) CM/SW Note    CSW attached [employer] contact information for Meals on Wheels to patients AVS.

## 2024-05-21 NOTE — Plan of Care (Signed)

## 2024-05-21 NOTE — Care Management Important Message (Signed)
 Important Message  Patient Details  Name: LANECIA SLIVA MRN: [id number] Date of Birth: [birth year]   Important Message Given:  Yes - Medicare IM     Vonzell Arrie Sharps 05/21/2024, 10:41 AM

## 2024-05-21 NOTE — Progress Notes (Signed)
 Mobility Specialist Progress Note;   05/21/24 0914  Mobility  Activity Pivoted/transferred to/from [facility]  Level of Assistance Minimal assist, patient does 75% or more  Assistive Device Front wheel walker  Distance Ambulated (ft) 5 ft  Activity Response Tolerated well  Mobility Referral Yes  Mobility visit 1 Mobility  Mobility Specialist Start Time (ACUTE ONLY) 0914  Mobility Specialist Stop Time (ACUTE ONLY) 0925  Mobility Specialist Time Calculation (min) (ACUTE ONLY) 11 min   Pt agreeable to mobility. Requested to use [facility] before transferring to chair. Required MinA to stand from EoB and transfer pt to [facility]. Pt left w/ call bell, instructed to call once finished.   Lauraine Erm Mobility Specialist Please contact via SecureChat or [employer] [phone]

## 2024-05-21 NOTE — Progress Notes (Addendum)
 Brief cardiology note  See formal signoff yesterday. Renal function continues to improve, and blood pressures have remained well controlled (with only one aberrant reading yesterday) on current medication regimen. Based on this, would not change recommendations from yesterday regarding medications. Had planned follow up in impact clinic 05/24/24 for close follow up. If BP and renal function allow, can consider restarting either spironolactone or losartan at that visit--I'd recommend spironolactone first at this may give her a little diuretic action and she may not then need standing hydrochlorothiazide  and can stay on PRN lasix . Could also restart SGLT2i at that time.  [name], MD, PhD, [facility]  [facility]  [place]  Heart & Vascular at [facility] at [facility] [address] [place], [place] [address] [phone]

## 2024-05-21 NOTE — Progress Notes (Signed)
 Mobility Specialist Progress Note;   05/21/24 0951  Mobility  Activity Ambulated with assistance  Level of Assistance Standby assist, set-up cues, supervision of patient - no hands on  Assistive Device Four wheel walker  Distance Ambulated (ft) 200 ft  Activity Response Tolerated well  Mobility Referral Yes  Mobility visit 1 Mobility  Mobility Specialist Start Time (ACUTE ONLY) 0951  Mobility Specialist Stop Time (ACUTE ONLY) 1016  Mobility Specialist Time Calculation (min) (ACUTE ONLY) 25 min   RN requesting walking O2 test, pt agreeable. Required no physical assistance during ambulation. Able to ambulate on RA, SPO2 94-97% throughout. Took 2x seated rest breaks on 4WW d/t BLE fatigue. Pt returned to room and left in chair with all needs met. RN notified.   Lauraine Erm Mobility Specialist Please contact via SecureChat or [employer] [phone]

## 2024-05-21 NOTE — Progress Notes (Signed)
 Reviewed AVS, patient expressed understanding of medications, MD follow up reviewed.   Removed IV, Site clean, dry and intact.  See LDA for information on wounds at discharge. CCMD contacted and informed patients is being discharged.  Patient states all belongings brought to the hospital at time of admission are accounted for and packed to take home.  Picked up medications from [facility] pharmacy. [facility] contacted to transport patient to Discharge lounge to wait for transportation home.

## 2024-05-21 NOTE — Discharge Summary (Signed)
 Physician Discharge Summary  Joan [name] FMW:[id number] DOB: 23-Jan-1956 DOA: 05/11/2024  PCP: Pcp, No  Admit date: 05/11/2024 Discharge date: 05/21/2024  Admitted From: Home Disposition: Home with Home health  Recommendations for Outpatient Follow-up:  Follow up with PCP in 1 week with repeat CBC/BMP Follow-up with pulmonology for management of sleep apnea, CPAP etc.  Follow-up with cardiology for GDMT titration for heart failure.   Follow up in ED if symptoms worsen or new appear   Home Health: No Equipment/Devices: None  Discharge Condition: Stable CODE STATUS: Full Diet recommendation: Heart healthy  Brief/Interim Summary:  Joan [name] is a 68 y.o. female with PMH of hypertension, diastolic congestive heart failure, diabetes mellitus type 2, history of DVT, morbid obesity, OSA presents to the ER on 05/11/2024 with complaints of with shortness of breath x  3 weeks she has been dealing with fluid retention recently that has persisted despite taking daily Lasix  for 7 days.. She is not currently on oxygen but has been using a CPAP machine, which broke back in February. Despite efforts, she has not received a replacement CPAP machine. She was hypertensive in the ED with blood pressures elevated up to 179/115, and hypoxic with supplemental oxygen need. Labs significant for BNP 4971, Chest x-ray showed no acute abnormality CT angiogram of the chest noted no pulmonary embolism with enlarged main pulmonary artery measuring 3.3 cm and cardiomegaly. Patient was admitted by [facility] for acute on chronic heart failure, consulted by cardiology.    Echocardiogram revealed LVEF 45 to 40%, mildly decreased function no wall motion abnormalities.   GDMT: Jardiance continued.  She was initiated on Toprol, losartan, Aldactone and hydralazine. Unfortunately with rising creatinine, Aldactone and losartan discontinued at this time.   Also hydralazine was discontinued to avoid hypotension.   Metoprolol decreased to 50 mg daily due to bradycardia.  Patient discharged home with recommendation for metoprolol 50 mg daily at this time.  Will need to reintroduce ARB, Aldactone in an outpatient basis.   Noted bradycardia during sleep.  She had her CPAP machine fixed(or new provided by DME prior to discharge).  She had follow-up with pulmonology for OSA management, titrate CPAP.  Essential hypertension: On GDMT as above.  Cardiology following continue metoprolol at reduced dose   Elevated troponin: Rather flat -likely  demand ischemia due to CHF   AKI on CKD3b: Creatinine increased to 2 with diuresis.  Needs outpatient follow-up with PCP/cardiology to ensure stability.  Recommend blood work within 1 week  Normocytic anemia: Monitor hb-baseline ~9 g, suspect in the setting of CKD.monitor.  History of DVT/PE on chronic anticoagulation since 01/2020: Continue Eliquis    Prolonged QT interval 538. Avoid QT prolonging medications. Correct any electrolyte abnormalities   T2DM without long-term use of insulin: Last available hemoglobin A1c noted to be 5.3 when checked 7 months ago.Cont Jardiance.  Blood sugar 101-127 on lab   Hyperlipidemia: Continue ezetimibe -simvastatin    OSA: Patient had not recently been on CPAP after it broke earlier this year in Feb-has been trying to get a replacement, but has not been able to yet.Ordered CPAP nightly here.  In the process to switch CPAP by [facility] per TOC this admission   GERD: Continue omeprazole   Morbid Obesity w/ Body mass index is 58.67 kg/m.: Will benefit with PCP follow-up, weight loss,healthy lifestyle and outpatient Mounjaro   Discharge home with home health  Discharge Diagnoses:  Principal Problem:   Acute on chronic diastolic congestive heart failure (HCC) Active Problems:  History of pulmonary embolism   History of DVT (deep vein thrombosis)   Chronic anticoagulation   Prolonged QT interval   Hypertensive  urgency   CKD stage 3a, GFR 45-59 ml/min (HCC)   Controlled type 2 diabetes mellitus without complication, without long-term current use of insulin (HCC)   Mixed hyperlipidemia   Sleep apnea   Morbid obesity with BMI of 60.0-69.9, adult (HCC)   GERD (gastroesophageal reflux disease)   Atrial tachycardia   AKI (acute kidney injury)   Hypotension due to hypovolemia   Encounter for education about heart failure   Labile hypertension    Discharge Instructions  Discharge Instructions     (HEART FAILURE PATIENTS) Call MD:  Anytime you have any of the following symptoms: 1) 3 pound weight gain in 24 hours or 5 pounds in 1 week 2) shortness of breath, with or without a dry hacking cough 3) swelling in the hands, feet or stomach 4) if you have to sleep on extra pillows at night in order to breathe.   Complete by: As directed    Call MD for:  difficulty breathing, headache or visual disturbances   Complete by: As directed    Diet - low sodium heart healthy   Complete by: As directed    Increase activity slowly   Complete by: As directed       Allergies as of 05/21/2024       Reactions   Penicillins Hives   Zestril  [lisinopril ] Cough        Medication List     PAUSE taking these medications    empagliflozin 10 MG Tabs tablet Wait to take this until your doctor or other care provider tells you to start again. Commonly known as: JARDIANCE Take 10 mg by mouth daily.   hydrALAZINE 25 MG tablet Wait to take this until your doctor or other care provider tells you to start again. Commonly known as: APRESOLINE Take 25 mg by mouth daily.   losartan-hydrochlorothiazide  100-25 MG tablet Wait to take this until your doctor or other care provider tells you to start again. Commonly known as: HYZAAR Take 1 tablet by mouth daily.       TAKE these medications    acetaminophen 650 MG CR tablet Commonly known as: TYLENOL Take 1,950 mg by mouth 2 (two) times daily as needed for  pain.   albuterol  108 (90 Base) MCG/ACT inhaler Commonly known as: VENTOLIN  HFA Inhale 1-2 puffs into the lungs every 6 (six) hours as needed for wheezing or shortness of breath.   cyclobenzaprine 5 MG tablet Commonly known as: FLEXERIL Take 5 mg by mouth 3 (three) times daily as needed for muscle spasms.   Eliquis  5 MG Tabs tablet Generic drug: apixaban  Take 1 tablet (5 mg total) by mouth 2 (two) times daily. What changed:  how much to take when to take this   Emergen-C Vitamin C Pack Take 1 packet by mouth daily as needed (immune support).   ezetimibe -simvastatin  10-40 MG tablet Commonly known as: VYTORIN  Take 1 tablet by mouth daily.   furosemide  20 MG tablet Commonly known as: LASIX  Take 2 tablets (40 mg total) by mouth daily as needed (weight gain). What changed:  how much to take when to take this reasons to take this Another medication with the same name was removed. Continue taking this medication, and follow the directions you see here.   Integra F  125-1 MG Caps Take 1 tablet by mouth daily. What changed:  when to  take this additional instructions   loratadine 10 MG tablet Commonly known as: CLARITIN Take 1 tablet (10 mg total) by mouth daily as needed for allergies.   methocarbamol  750 MG tablet Commonly known as: ROBAXIN  Take 750 mg by mouth 2 (two) times daily as needed for muscle spasms.   metoprolol succinate 50 MG 24 hr tablet Commonly known as: TOPROL-XL Take 1 tablet (50 mg total) by mouth daily. Take with or immediately following a meal. Start taking on: May 22, 2024   Mounjaro 5 MG/0.5ML Pen Generic drug: tirzepatide Inject 5 mg into the skin every Friday.   Myrbetriq 50 MG Tb24 tablet Generic drug: mirabegron ER Take 50 mg by mouth daily.   omeprazole 40 MG capsule Commonly known as: PRILOSEC Take 40 mg by mouth daily.   OVER THE COUNTER MEDICATION Take 1 tablet by mouth daily as needed (congested lymphs). OTC natural lymph  supplment   solifenacin  10 MG tablet Commonly known as: VESICARE  Take 1 tablet by mouth once daily   traMADol  50 MG tablet Commonly known as: ULTRAM  Take 1 tablet (50 mg total) by mouth every 6 (six) hours as needed.   Vitamin D  (Ergocalciferol ) 1.25 MG (50000 UNIT) Caps capsule Commonly known as: DRISDOL  Take 1 capsule (50,000 Units total) by mouth every 7 (seven) days. What changed: when to take this               Durable Medical Equipment  (From admission, onward)           Start     Ordered   05/14/24 1544  For home use only DME Bedside commode  Once       Comments: Bariatric  Question:  Patient needs a bedside commode to treat with the following condition  Answer:  Acute congestive heart failure (HCC)   05/14/24 1543   05/14/24 1542  For home use only DME Walker rolling  Once       Comments: Bariatric Rolling walker  Question Answer Comment  Walker: With 5 Inch Wheels   Patient needs a walker to treat with the following condition Acute congestive heart failure (HCC)      05/14/24 1543            Contact information for follow-up providers     [facility]. Go in 7 day(s).   Specialty: Cardiology Why: Hospital follow up 05/24/2024 @ 11 am PLEASE bring a current medication list to appointment FREE valet parking, Entrance C, off [address] for [address] and [employer] entrance Contact information: [address] [place]  [phone]        [facility] Follow up.   Why: Bariatric rolling walker and Bariatric bedside commode-to be delivered to the room. Contact information: [address] [place] [address] [phone]         [facility] Follow up.   Specialty: Home Health Services Why: Physical Therapy and Aide-office to call with visit times. Contact information: [address] [place]  [address] [phone]         Leonce Prost, MD. Schedule an appointment as soon as possible for a visit in 1 week(s).   Contact information: [address] [place] [address] [phone]              Contact information for after-discharge care     Home Medical Care     [facility]) .   Service: Home Health Services Contact information: [address] [place]  [address] [phone]                    Allergies  Allergen Reactions   Penicillins Hives   Zestril  [Lisinopril ] Cough    Consultations:    Procedures/Studies: ECHOCARDIOGRAM COMPLETE Result Date: 05/12/2024    ECHOCARDIOGRAM REPORT   Patient Name:   Joan [name] Date of Exam: 05/12/2024 Medical Rec #:  [id number]          Height:       66.0 in Accession #:    [id number]         Weight:       387.6 lb Date of Birth:  06/24/1956          BSA:          2.649 m Patient Age:    67 years           BP:           155/92 mmHg Patient Gender: F                  HR:           60 bpm. Exam Location:  Inpatient Procedure: 2D Echo, Cardiac Doppler and Color Doppler (Both Spectral and Color            Flow Doppler were utilized during procedure). Indications:    I50.40* Unspecified combined systolic (congestive) and diastolic                 (congestive) heart failure  History:        Patient has prior history of Echocardiogram examinations, most                 recent 12/11/2023. CHF, Aortic Valve Disease; Risk Factors:Sleep                 Apnea, Hypertension, Dyslipidemia and Diabetes. Mild aortic                 stenosis. Pulmonary embolus.m.  Sonographer:    Ellouise Mose RDCS Referring Phys: [id number] RONDELL A SMITH  Sonographer Comments: Patient is obese. Image acquisition challenging due to patient body habitus. IMPRESSIONS  1. Ekg changes during exam. . Left ventricular ejection fraction, by estimation, is 45 to 50%. Left ventricular ejection fraction by 2D MOD  biplane is 47.5 %. The left ventricle has mildly decreased function. The left ventricle has no regional wall motion abnormalities. The left ventricular internal cavity size was moderately dilated. There is mild concentric left ventricular hypertrophy. Left ventricular diastolic parameters are consistent with Grade I diastolic dysfunction (impaired relaxation). Elevated left ventricular end-diastolic pressure.  2. Right ventricular systolic function is mildly reduced. The right ventricular size is moderately enlarged. There is normal pulmonary artery systolic pressure. The estimated right ventricular systolic pressure is 33.8 mmHg.  3. Left atrial size was moderately dilated.  4. Right atrial size was moderately dilated.  5. The mitral valve is degenerative. Moderate mitral valve regurgitation. Mild mitral stenosis. Moderate mitral annular calcification.  6. The aortic valve is calcified. Aortic valve regurgitation is trivial. Mild aortic valve stenosis. Aortic valve area, by VTI measures 1.91 cm. Aortic valve mean gradient measures 12.0 mmHg. Aortic valve Vmax measures 2.36 m/s. Peak gradient 22.3 mmHg, DI 0.46.  7. Aortic dilatation noted. There is mild dilatation of the ascending aorta, measuring 42 mm.  8.  The inferior vena cava is dilated in size with <50% respiratory variability, suggesting right atrial pressure of 15 mmHg.  9. Cannot exclude a [place] PFO. Comparison(s): The left ventricular function is worsened. FINDINGS  Left Ventricle: Ekg changes during exam. Left ventricular ejection fraction, by estimation, is 45 to 50%. Left ventricular ejection fraction by 2D MOD biplane is 47.5 %. The left ventricle has mildly decreased function. The left ventricle has no regional wall motion abnormalities. The left ventricular internal cavity size was moderately dilated. There is mild concentric left ventricular hypertrophy. Left ventricular diastolic parameters are consistent with Grade I diastolic dysfunction  (impaired  relaxation). Elevated left ventricular end-diastolic pressure. Right Ventricle: The right ventricular size is moderately enlarged. No increase in right ventricular wall thickness. Right ventricular systolic function is mildly reduced. There is normal pulmonary artery systolic pressure. The tricuspid regurgitant velocity is 2.17 m/s, and with an assumed right atrial pressure of 15 mmHg, the estimated right ventricular systolic pressure is 33.8 mmHg. Left Atrium: Left atrial size was moderately dilated. Right Atrium: Right atrial size was moderately dilated. Pericardium: There is no evidence of pericardial effusion. Mitral Valve: The mitral valve is degenerative in appearance. There is moderate calcification of the posterior mitral valve leaflet(s). Moderate mitral annular calcification. Moderate mitral valve regurgitation. Mild mitral valve stenosis. MV peak gradient, 10.0 mmHg. The mean mitral valve gradient is 4.0 mmHg. Tricuspid Valve: The tricuspid valve is normal in structure. Tricuspid valve regurgitation is trivial. No evidence of tricuspid stenosis. Aortic Valve: The aortic valve is calcified. Aortic valve regurgitation is trivial. Mild aortic stenosis is present. Aortic valve mean gradient measures 12.0 mmHg. Aortic valve peak gradient measures 22.3 mmHg. Aortic valve area, by VTI measures 1.91 cm. Pulmonic Valve: The pulmonic valve was normal in structure. Pulmonic valve regurgitation is not visualized. No evidence of pulmonic stenosis. Aorta: Aortic dilatation noted and the aortic root is normal in size and structure. There is mild dilatation of the ascending aorta, measuring 42 mm. Venous: The inferior vena cava is dilated in size with less than 50% respiratory variability, suggesting right atrial pressure of 15 mmHg. IAS/Shunts: There is right bowing of the interatrial septum, suggestive of elevated left atrial pressure. Cannot exclude a [place] PFO.  LEFT VENTRICLE PLAX 2D                         Biplane EF (MOD) LVIDd:         6.00 cm         LV Biplane EF:   Left LVIDs:         4.20 cm                          ventricular LV PW:         1.20 cm                          ejection LV IVS:        1.10 cm                          fraction by LVOT diam:     2.30 cm                          2D MOD LV SV:         92  biplane is LV SV Index:   35                               47.5 %. LVOT Area:     4.15 cm LV IVRT:       92 msec  LV Volumes (MOD) LV vol d, MOD    179.0 ml A2C: LV vol d, MOD    146.6 ml A4C: LV vol s, MOD    99.0 ml A2C: LV vol s, MOD    75.6 ml A4C: LV SV MOD A2C:   80.1 ml LV SV MOD A4C:   146.6 ml LV SV MOD BP:    78.1 ml IVC IVC diam: 2.60 cm  PULMONARY VEINS Diastolic Velocity: 45.10 cm/s S/D Velocity:       1.40 Systolic Velocity:  63.00 cm/s LEFT ATRIUM             Index        RIGHT ATRIUM           Index LA diam:        4.30 cm 1.62 cm/m   RA Area:     19.10 cm LA Vol (A2C):   65.4 ml 24.69 ml/m  RA Volume:   56.10 ml  21.18 ml/m LA Vol (A4C):   94.5 ml 35.67 ml/m LA Biplane Vol: 80.4 ml 30.35 ml/m  AORTIC VALVE AV Area (Vmax):    1.88 cm AV Area (Vmean):   1.94 cm AV Area (VTI):     1.91 cm AV Vmax:           236.00 cm/s AV Vmean:          158.200 cm/s AV VTI:            0.483 m AV Peak Grad:      22.3 mmHg AV Mean Grad:      12.0 mmHg LVOT Vmax:         107.00 cm/s LVOT Vmean:        73.900 cm/s LVOT VTI:          0.222 m LVOT/AV VTI ratio: 0.46  AORTA Ao Root diam: 3.30 cm Ao Asc diam:  4.15 cm MITRAL VALVE                TRICUSPID VALVE MV Area (PHT): 3.95 cm     TR Peak grad:   18.8 mmHg MV Area VTI:   1.78 cm     TR Vmax:        217.00 cm/s MV Peak grad:  10.0 mmHg MV Mean grad:  4.0 mmHg     SHUNTS MV Vmax:       1.58 m/s     Systemic VTI:  0.22 m MV Vmean:      89.3 cm/s    Systemic Diam: 2.30 cm MV Decel Time: 192 msec MV E velocity: 134.25 cm/s MV A velocity: 99.37 cm/s MV E/A ratio:  1.35 Vinie Maxcy MD Electronically signed by Vinie Maxcy MD Signature Date/Time: 05/12/2024/4:24:53 PM    Final    CT Angio Chest PE W and/or Wo Contrast Result Date: 05/11/2024 EXAM: CTA of the Chest with contrast for PE 05/11/2024 09:33:34 PM TECHNIQUE: CTA of the chest was performed without and with the administration of 80 mL of iohexol  (OMNIPAQUE ) 350 MG/ML intravenous contrast. Multiplanar reformatted images are provided for review. MIP images are provided for review. Automated exposure control, iterative  reconstruction, and/or weight based adjustment of the mA/kV was utilized to reduce the radiation dose to as low as reasonably achievable. COMPARISON: None available. CLINICAL HISTORY: Pulmonary embolism (PE) suspected, high prob. 68 y/o female with worsening SOB x 2 weeks. Patient states her legs are swollen and tight. FINDINGS: PULMONARY ARTERIES: Pulmonary arteries are adequately opacified for evaluation. No pulmonary embolism. The main pulmonary artery is enlarged, measuring up to 3.3 cm. MEDIASTINUM: Enlarged heart size. Aortic valve leaflet calcification. Mild atherosclerotic plaque. There is no acute abnormality of the thoracic aorta. LYMPH NODES: No mediastinal, hilar or axillary lymphadenopathy. LUNGS AND PLEURA: Nonspecific mosaic attenuation of the lungs. No focal consolidation or pulmonary edema. No pleural effusion or pneumothorax. UPPER ABDOMEN: Limited images of the upper abdomen are unremarkable. SOFT TISSUES AND BONES: Degenerative changes of the spine. No acute soft tissue abnormality. IMPRESSION: 1. No pulmonary embolism. 2. Enlarged main pulmonary artery measuring 3.3 cm, correlate for pulmonary hypertension. 3. Cardiomegaly. Electronically signed by: Morgane Naveau MD 05/11/2024 09:48 PM EDT RP Workstation: [id number]   DG Chest Port 1 View Result Date: 05/11/2024 EXAM: 1 VIEW XRAY OF THE CHEST 05/11/2024 08:38:00 PM COMPARISON: Chest x-ray 12/05/2023. CLINICAL HISTORY: SOB. Patient has had worsening SOB x 2 weeks. Is prescribed  lasix  40mg  and taking it x 2 days with no improvement. Reports that her legs are swollen and tight. FINDINGS: LUNGS AND PLEURA: No focal pulmonary opacity. No pulmonary edema. No pleural effusion. No pneumothorax. HEART AND MEDIASTINUM: The heart is enlarged. BONES AND SOFT TISSUES: No acute osseous abnormality. IMPRESSION: 1. No acute findings. 2. Stable cardiomegaly. Electronically signed by: Greig Pique MD 05/11/2024 08:42 PM EDT RP Workstation: [id number]      Subjective:   Discharge Exam: Vitals:   05/21/24 1138 05/21/24 1139  BP: (!) 155/86   Pulse: 73 70  Resp: (!) 21 20  Temp: 98.2 F (36.8 C)   SpO2: 94% 97%    General: Pt is alert, awake, not in acute distress Cardiovascular: rate controlled, S1/S2 + Respiratory: bilateral decreased breath sounds at bases Abdominal: Soft, NT, ND, bowel sounds + Extremities: no edema, no cyanosis    The results of significant diagnostics from this hospitalization (including imaging, microbiology, ancillary and laboratory) are listed below for reference.     Microbiology: No results found for this or any previous visit (from the past 240 hours).   Labs: BNP (last 3 results) No results for input(s): BNP in the last 8760 hours. Basic Metabolic Panel: Recent Labs  Lab 05/17/24 0650 05/18/24 0403 05/19/24 0513 05/20/24 0432 05/21/24 0410  NA 139 138 139 138 138  K 4.6 4.4 4.4 4.4 4.4  CL 99 101 100 101 103  CO2 28 25 26 27 25   GLUCOSE 116* 120* 100* 100* 95  BUN 35* 35* 36* 39* 32*  CREATININE 2.42* 2.43* 2.12* 2.07* 1.86*  CALCIUM 9.1 9.1 9.0 8.7* 8.4*   Liver Function Tests: No results for input(s): AST, ALT, ALKPHOS, BILITOT, PROT, ALBUMIN in the last 168 hours. No results for input(s): LIPASE, AMYLASE in the last 168 hours. No results for input(s): AMMONIA in the last 168 hours. CBC: Recent Labs  Lab 05/15/24 0420 05/16/24 0943 05/17/24 0650 05/18/24 0403 05/19/24 0513 05/20/24 0432  05/21/24 0410  WBC 6.9 6.7 7.6 6.5 6.8 7.0 6.8  NEUTROABS 4.6 4.3 4.4 4.2  --   --   --   HGB 9.5* 9.5* 9.9* 9.6* 9.4* 9.4* 9.0*  HCT 31.0* 31.2* 31.6* 31.1* 30.8* 31.0* 29.4*  MCV  83.3 84.6 82.9 83.2 84.8 84.7 85.0  PLT 232 235 261 248 202 218 214   Cardiac Enzymes: No results for input(s): CKTOTAL, CKMB, CKMBINDEX, TROPONINI in the last 168 hours. BNP: Invalid input(s): POCBNP CBG: No results for input(s): GLUCAP in the last 168 hours. D-Dimer No results for input(s): DDIMER in the last 72 hours. Hgb A1c No results for input(s): HGBA1C in the last 72 hours. Lipid Profile No results for input(s): CHOL, HDL, LDLCALC, TRIG, CHOLHDL, LDLDIRECT in the last 72 hours. Thyroid function studies No results for input(s): TSH, T4TOTAL, T3FREE, THYROIDAB in the last 72 hours.  Invalid input(s): FREET3 Anemia work up No results for input(s): VITAMINB12, FOLATE, FERRITIN, TIBC, IRON, RETICCTPCT in the last 72 hours. Urinalysis    Component Value Date/Time   COLORURINE YELLOW 12/05/2023 2229   APPEARANCEUR CLOUDY (A) 12/05/2023 2229   LABSPEC >=1.030 12/05/2023 2229   PHURINE 5.5 12/05/2023 2229   GLUCOSEU NEGATIVE 12/05/2023 2229   HGBUR NEGATIVE 12/05/2023 2229   BILIRUBINUR NEGATIVE 12/05/2023 2229   BILIRUBINUR negative 11/29/2020 1638   KETONESUR NEGATIVE 12/05/2023 2229   PROTEINUR 100 (A) 12/05/2023 2229   UROBILINOGEN 0.2 11/29/2020 1638   NITRITE POSITIVE (A) 12/05/2023 2229   LEUKOCYTESUR NEGATIVE 12/05/2023 2229   Sepsis Labs Recent Labs  Lab 05/18/24 0403 05/19/24 0513 05/20/24 0432 05/21/24 0410  WBC 6.5 6.8 7.0 6.8   Microbiology No results found for this or any previous visit (from the past 240 hours).   Time coordinating discharge: 35 minutes  SIGNED:   Yago Ludvigsen, MD  [facility] 05/21/2024, 3:51 PM

## 2024-05-21 NOTE — Progress Notes (Signed)
 Nurse requested Mobility Specialist to perform oxygen saturation test with pt which includes removing pt from oxygen both at rest and while ambulating.  Below are the results from that testing.     Patient Saturations on Room Air at Rest = spO2 96%  Patient Saturations on Room Air while Ambulating = sp02 94% .   Patient Saturations on 0 Liters of oxygen while Ambulating = sp02 94%  At end of testing pt left in room on 0  Liters of oxygen.  Reported results to nurse.     Lauraine Erm Mobility Specialist Please contact via SecureChat or [employer] [phone]

## 2024-05-21 NOTE — Progress Notes (Signed)
   Heart Failure Stewardship Pharmacist Progress Note   PCP: Pcp, No PCP-Cardiologist: Jennifer JONELLE Crape, MD    HPI:  68 yo F with PMH of T2DM, HTN, HLD, OSA, DVT/PE, CKD III, and CHF.   Presented to the ED on 10/28 with worsening shortness of breath and LE edema. BNP elevated. CXR with no acute findings, stable cardiomegaly. CTA negative for PE. ECHO 10/29 with LVEF 45-50% (60-65% in 11/2023), no RWMA, G1DD, RV mildly reduced, moderate MR.   Reviewed changes to GDMT and goals of therapy. Does not have a pill box at home but will get one. Reports taking all medications (including Eliquis ) at one time per day. She was taking both Eliquis  tablets at the same time of the day so she wouldn't forget to take the evening dose. Education provided.   Denies shortness of breath. Tells me she hasn't used a CPAP in several months because hers was broken. Has been wearing one for sleep in the hospital and is adjusting. Difficult to assess volume status with body habitus. Creatinine slowly improving. She wants to make sure her PCP, Dr. Leonce and Dr. Wendie with [facility] are notified of the changes that are occurring in the hospital. Is interested in wearing TED hose at home after discharge.   Current HF Medications: Beta Blocker: metoprolol XL 50 mg daily  Prior to admission HF Medications: Diuretic: furosemide  40 mg daily ACE/ARB/ARNI: losartan/hctz 100/25 mg daily SGLT2i: Jardiance 10 mg daily  Pertinent Lab Values: Serum creatinine 2.43>2.12>2.07>1.86, BUN 32, Potassium 4.4, Sodium 138, BNP 4971   Vital Signs: Weight: 374 lbs (admission weight: 387 lbs) Blood pressure: 130/70s  Heart rate: 70-80s  I/O: net -0.7L yesterday; net -11.9L since admission  Medication Assistance / Insurance Benefits Check: Does the patient have prescription insurance?  Yes Type of insurance plan: [employer]  Outpatient Pharmacy:  Prior to admission outpatient pharmacy: [facility] Is the patient  willing to use [facility] TOC pharmacy at discharge? Yes Is the patient willing to transition their outpatient pharmacy to utilize a [facility] outpatient pharmacy?   No    Assessment: 1. Acute on chronic diastolic CHF (LVEF 45-50%). NYHA class II symptoms. - Off IV lasix  with AKI. Difficult to assess volume status with body habitus. Strict I/Os and daily weights. Keep K>4 and Mg>2.  - Continue metoprolol XL 50 mg daily - decreased dose due to bradycardia - Holding losartan, spironolactone, and Jardiance with AKI - Holding standing hydralazine 100 mg TID with prior episodes of low BP - PRN hydral ordered.   Plan: 1) Medication changes recommended at this time: - Restart spironolactone if AKI improves - Will sign off at this time  2) Patient assistance: - None pending  3)  Education  - Patient has been educated on current HF medications and potential additions to HF medication regimen - Patient verbalizes understanding that over the next few months, these medication doses may change and more medications may be added to optimize HF regimen - Patient has been educated on basic disease state pathophysiology and goals of therapy - Educated on the importance of taking medications twice daily and reviewed strategies to help with compliance   Duwaine Plant, PharmD, BCPS Heart Failure Stewardship Pharmacist Phone [phone]

## 2024-05-21 NOTE — Discharge Instructions (Addendum)
 [facility] [employer] [facility] Address: [address]  [place], [place] [address] Open ? Closes 5?PM Phone: [phone]

## 2024-05-24 ENCOUNTER — Inpatient Hospital Stay (HOSPITAL_COMMUNITY): Admit: 2024-05-24 | Discharge: 2024-05-24 | Disposition: A | Discharge status: Home / Self Care

## 2024-05-24 NOTE — Progress Notes (Incomplete)
 HEART & VASCULAR TRANSITION OF CARE CONSULT NOTE   Referring Physician: Dr. Mcarthur PCP: Dr. Leonce, [facility] Cardiologist: Jennifer JONELLE Crape, MD  HPI: Referred to clinic by Dr. Sigdel for heart failure consultation.   Joan [name] is a 68 y.o. female with history HFpEF, CKD 3b, HTN, DVT on eliquis , aortic stenosis, HLD, obesity, OSA.  Echo 12/2021 showed EF 50-55% with G1DD, and mild aortic stenosis. Has followed with Dr Crape, [facility].   She was seen in [facility] ED 5/25 with lower extremity edema and UTI, reported prior diagnosis of lymphedema. She was given antibiotics and lasix  was increased and she followed up with her PCP.  Most recently was admitted to [facility] 10/25 for CHF exacerbation. Echo showed EF 45-50%, G1DD, mildly reduced RV, mod MR, mils AS. She was diuresed and GDMT adjusted.     Today she presents for transition of care visit. Overall feeling ***. NYHA ***. Reports {Symptoms; cardiac:12860::dyspnea,fatigue}. Denies {Symptoms; cardiac:12860::chest pain,dyspnea,fatigue,near-syncope,orthopnea,palpitations,dizziness,abnormal bleeding}. Able to perform ADLs. Appetite okay. Weight at home ***. BP at home***. Compliant with all medications. Denies ETOH, tobacco, or drug use.    Cardiac Testing:    Past Medical History:  Diagnosis Date   Abnormal glucose 05/26/2019   Acid reflux    Acute bronchitis 08/05/2022   Acute pulmonary embolism (HCC) 01/21/2020   Age-related nuclear cataract of both eyes 03/07/2017   Anemia 08/05/2022   Antalgic gait 04/04/2023   Breast lump on left side at 4 o'clock position 06/28/2018   CAP (community acquired pneumonia) 08/05/2022   Chronic pain of both knees 11/02/2018   Class 3 severe obesity without serious comorbidity with body mass index (BMI) of 50.0 to 59.9 in adult [facility]) 01/26/2020   Complete tear of right rotator cuff 11/12/2016   Cough 01/26/2020   Diabetes mellitus type 2 without  retinopathy (HCC) 03/07/2017   Diabetes mellitus without complication (HCC)    Pt states she is prediabetic   DVT of deep femoral vein, left (HCC) 01/26/2020   Essential hypertension 06/28/2018   Fall from slipping on ice 07/26/2019   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   Functional urinary incontinence 06/28/2018   Hypertension    Mixed hyperlipidemia 05/26/2019   Myopia with astigmatism and presbyopia, bilateral 03/07/2017   OSA (obstructive sleep apnea)    Primary localized osteoarthrosis of the knee, right 11/12/2016   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     albuterol  (VENTOLIN  HFA) 108 (90 Base) MCG/ACT inhaler Inhale 1-2 puffs into the lungs every 6 (six) hours as needed for wheezing or shortness of breath. 6.7 g 0   apixaban  (ELIQUIS ) 5 MG TABS tablet Take 1 tablet (5 mg total) by mouth 2 (two) times daily. (Patient taking differently: Take 10 mg by mouth daily.) 60 tablet 11   cyclobenzaprine (FLEXERIL) 5 MG tablet Take 5 mg by mouth 3 (three) times daily as needed for muscle spasms.     [Paused] empagliflozin (JARDIANCE) 10 MG TABS tablet Take 10 mg by mouth daily.     ezetimibe -simvastatin  (VYTORIN ) 10-40 MG tablet Take 1 tablet by mouth daily. 90 tablet  1   Fe Fum-FePoly-FA-Vit C-Vit B3 (INTEGRA F ) 125-1 MG CAPS Take 1 tablet by mouth daily. (Patient taking differently: Take 1 tablet by mouth See admin instructions. Take 1 capsule by mouth 1 or 2 times a week.) 30 capsule 2   furosemide  (LASIX ) 20 MG tablet Take 2 tablets (40 mg total) by mouth daily as needed (weight gain).     [Paused] hydrALAZINE (APRESOLINE) 25 MG tablet Take 25 mg by mouth  daily.     loratadine (CLARITIN) 10 MG tablet Take 1 tablet (10 mg total) by mouth daily as needed for allergies. 15 tablet 0   [Paused] losartan-hydrochlorothiazide  (HYZAAR) 100-25 MG tablet Take 1 tablet by mouth daily.     methocarbamol  (ROBAXIN ) 750 MG tablet Take 750 mg by mouth 2 (two) times daily as needed for muscle spasms.     metoprolol succinate (TOPROL-XL) 50 MG 24 hr tablet Take 1 tablet (50 mg total) by mouth daily. Take with or immediately following a meal. 30 tablet 0   MOUNJARO 5 MG/0.5ML Pen Inject 5 mg into the skin every Friday.     Multiple Vitamins-Minerals (EMERGEN-C VITAMIN C) PACK Take 1 packet by mouth daily as needed (immune support).     MYRBETRIQ 50 MG TB24 tablet Take 50 mg by mouth daily.     omeprazole (PRILOSEC) 40 MG capsule Take 40 mg by mouth daily.     OVER THE COUNTER MEDICATION Take 1 tablet by mouth daily as needed (congested lymphs). OTC natural lymph supplment     solifenacin  (VESICARE ) 10 MG tablet Take 1 tablet by mouth once daily 30 tablet 0   traMADol  (ULTRAM ) 50 MG tablet Take 1 tablet (50 mg total) by mouth every 6 (six) hours as needed. 30 tablet    Vitamin D , Ergocalciferol , (DRISDOL ) 1.25 MG (50000 UNIT) CAPS capsule Take 1 capsule (50,000 Units total) by mouth every 7 (seven) days. (Patient taking differently: Take 50,000 Units by mouth every Sunday.) 12 capsule 0   No current facility-administered medications for this visit.    Allergies  Allergen Reactions   Penicillins Hives   Zestril  [Lisinopril ] Cough      Social History   Socioeconomic History   Marital status: Widowed    Spouse name: Not on file   Number of children: 2   Years of education: Not on file   Highest education level: Associate degree: occupational, scientist, product/process development, or vocational program  Occupational History   Occupation: CNA  Tobacco Use   Smoking status: Never   Smokeless tobacco: Never  Vaping Use   Vaping status: Not on file  Substance and Sexual Activity    Alcohol use: Not Currently    Comment: socially   Drug use: Never   Sexual activity: Not on file  Other Topics Concern   Not on file  Social History Narrative   ** Merged History Encounter **       Social Drivers of Health   Financial Resource Strain: Low Risk  (05/13/2024)   Overall Financial Resource Strain (CARDIA)    Difficulty of Paying Living Expenses: Not hard at all  Food Insecurity: No Food Insecurity (05/14/2024)   Hunger Vital Sign    Worried About Running Out of Food in the Last Year: Never true    Ran Out of Food in the Last Year: Never true  Transportation Needs: No Transportation Needs (05/13/2024)   PRAPARE - Administrator, Civil Service (Medical): No    Lack of Transportation (Non-Medical): No  Physical Activity: Not on file  Stress: Not on file  Social Connections: Unknown (05/14/2024)   Social Connection and Isolation Panel    Frequency of Communication with Friends and Family: Never    Frequency of Social Gatherings with Friends and Family: Never    Attends Religious Services: Never    Database Administrator or Organizations: No    Attends Banker Meetings: Never    Marital Status: Patient declined  Catering Manager Violence: Not At Risk (05/14/2024)   Humiliation, Afraid, Rape, and Kick questionnaire    Fear of Current or Ex-Partner: No    Emotionally Abused: No    Physically Abused: No    Sexually Abused: No      Family History  Problem Relation Age of Onset   Heart disease Mother    Heart disease Father    Prostate cancer Father     There were no vitals filed for this visit.  PHYSICAL EXAM: General: Well appearing. No distress on RA Cardiac: JVP ***. S1 and S2 present. No murmurs or rub. Resp: Lung sounds clear and equal B/L Abdomen: Soft, non-tender, non-distended.  Extremities: Warm and dry.  *** edema.  Neuro: Alert and oriented x3. Affect pleasant. Moves all extremities without difficulty.  ECG  (personally reviewed):  ASSESSMENT & PLAN:  Chronic HFmrEF - 45-50%, G1DD, mildly reduced RV, mod MR, mils AS -  - NYHA *** Volume *** - continue lasix  40 mg prn - GDMT: ? blocker: ARB/ARNI: hyzaar 100-25? MRA: SGLT2i: continue jardiance 10 mg daily Vasoldilators: continue hydral 25 mg tid  MR - mod on echo 10/25 - suspect functional  AS - mild, stable  Ascending aortic aneurysm - 4.2 cm on echo - need annual surveillance by CT  CKD 3b - baseline Cr ***  HTN  - medications as above  OSA - noncompliant with CPAP (machine broke in Feb)  Obesity - There is no height or weight on file to calculate BMI. - on mounjaro ***  H/o DVT - on eliquis     Referred to HFSW (PCP, Medications, Transportation, ETOH Abuse, Drug Abuse, Insurance, Financial ): Yes or No Refer to Pharmacy: Yes or No Refer to Home Health: Yes on No Refer to Advanced Heart Failure Clinic: Yes or no  Refer to General Cardiology: Yes or No  Follow up with ***  Akeelah Seppala, NP 05/24/24

## 2024-05-28 ENCOUNTER — Telehealth: Payer: Self-pay

## 2024-05-28 NOTE — Telephone Encounter (Addendum)
 Called to confirm/remind patient of their appointment at the Advanced Heart Failure Clinic on 05/31/24 2:00.   Appointment:   [x] Confirmed  [] Left mess   [] No answer/No voice mail  [] VM Full/unable to leave message  [] Phone not in service  Patient reminded to bring all medications and/or complete list.  Confirmed patient has transportation. Gave directions, instructed to utilize valet parking.

## 2024-05-31 ENCOUNTER — Other Ambulatory Visit (HOSPITAL_COMMUNITY): Payer: Self-pay

## 2024-05-31 ENCOUNTER — Encounter (HOSPITAL_COMMUNITY): Payer: Self-pay

## 2024-05-31 ENCOUNTER — Telehealth (HOSPITAL_COMMUNITY): Payer: Self-pay

## 2024-05-31 ENCOUNTER — Other Ambulatory Visit: Payer: Self-pay

## 2024-05-31 ENCOUNTER — Ambulatory Visit (HOSPITAL_COMMUNITY)
Admission: RE | Admit: 2024-05-31 | Discharge: 2024-05-31 | Disposition: A | Discharge status: Home / Self Care | Source: Ambulatory Visit | Attending: Cardiology | Admitting: Cardiology

## 2024-05-31 VITALS — BP 152/84 | HR 71 | Ht 66.0 in | Wt 294.0 lb

## 2024-05-31 DIAGNOSIS — Z6841 Body Mass Index (BMI) 40.0 and over, adult: Secondary | ICD-10-CM | POA: Diagnosis not present

## 2024-05-31 DIAGNOSIS — G4733 Obstructive sleep apnea (adult) (pediatric): Secondary | ICD-10-CM | POA: Diagnosis not present

## 2024-05-31 DIAGNOSIS — I340 Nonrheumatic mitral (valve) insufficiency: Secondary | ICD-10-CM | POA: Diagnosis not present

## 2024-05-31 DIAGNOSIS — E8770 Fluid overload, unspecified: Secondary | ICD-10-CM | POA: Diagnosis not present

## 2024-05-31 DIAGNOSIS — Z7985 Long-term (current) use of injectable non-insulin antidiabetic drugs: Secondary | ICD-10-CM | POA: Insufficient documentation

## 2024-05-31 DIAGNOSIS — E66813 Obesity, class 3: Secondary | ICD-10-CM | POA: Insufficient documentation

## 2024-05-31 DIAGNOSIS — E1122 Type 2 diabetes mellitus with diabetic chronic kidney disease: Secondary | ICD-10-CM | POA: Diagnosis not present

## 2024-05-31 DIAGNOSIS — Z7984 Long term (current) use of oral hypoglycemic drugs: Secondary | ICD-10-CM | POA: Diagnosis not present

## 2024-05-31 DIAGNOSIS — I350 Nonrheumatic aortic (valve) stenosis: Secondary | ICD-10-CM | POA: Insufficient documentation

## 2024-05-31 DIAGNOSIS — Z86718 Personal history of other venous thrombosis and embolism: Secondary | ICD-10-CM | POA: Diagnosis not present

## 2024-05-31 DIAGNOSIS — Z7901 Long term (current) use of anticoagulants: Secondary | ICD-10-CM | POA: Insufficient documentation

## 2024-05-31 DIAGNOSIS — I5042 Chronic combined systolic (congestive) and diastolic (congestive) heart failure: Secondary | ICD-10-CM | POA: Insufficient documentation

## 2024-05-31 DIAGNOSIS — I5032 Chronic diastolic (congestive) heart failure: Secondary | ICD-10-CM | POA: Diagnosis not present

## 2024-05-31 DIAGNOSIS — Z86711 Personal history of pulmonary embolism: Secondary | ICD-10-CM | POA: Insufficient documentation

## 2024-05-31 DIAGNOSIS — Z79899 Other long term (current) drug therapy: Secondary | ICD-10-CM | POA: Insufficient documentation

## 2024-05-31 DIAGNOSIS — N1832 Chronic kidney disease, stage 3b: Secondary | ICD-10-CM | POA: Diagnosis not present

## 2024-05-31 DIAGNOSIS — Z91199 Patient's noncompliance with other medical treatment and regimen due to unspecified reason: Secondary | ICD-10-CM | POA: Diagnosis not present

## 2024-05-31 DIAGNOSIS — I7121 Aneurysm of the ascending aorta, without rupture: Secondary | ICD-10-CM | POA: Diagnosis not present

## 2024-05-31 DIAGNOSIS — I110 Hypertensive heart disease with heart failure: Secondary | ICD-10-CM | POA: Diagnosis not present

## 2024-05-31 DIAGNOSIS — I5022 Chronic systolic (congestive) heart failure: Secondary | ICD-10-CM | POA: Diagnosis not present

## 2024-05-31 DIAGNOSIS — I10 Essential (primary) hypertension: Secondary | ICD-10-CM

## 2024-05-31 LAB — BASIC METABOLIC PANEL WITH GFR
Anion gap: 11 (ref 5–15)
BUN: 36 mg/dL — ABNORMAL HIGH (ref 8–23)
CO2: 25 mmol/L (ref 22–32)
Calcium: 9.3 mg/dL (ref 8.9–10.3)
Chloride: 103 mmol/L (ref 98–111)
Creatinine, Ser: 1.78 mg/dL — ABNORMAL HIGH (ref 0.44–1.00)
GFR, Estimated: 31 mL/min — ABNORMAL LOW (ref >60)
Glucose, Bld: 97 mg/dL (ref 70–99)
Potassium: 3.8 mmol/L (ref 3.5–5.1)
Sodium: 139 mmol/L (ref 135–145)

## 2024-05-31 LAB — BRAIN NATRIURETIC PEPTIDE: B Natriuretic Peptide: 373.5 pg/mL — ABNORMAL HIGH (ref 0.0–100.0)

## 2024-05-31 MED ORDER — EMPAGLIFLOZIN 10 MG PO TABS: 10.0000 mg | ORAL_TABLET | Freq: Every day | ORAL | 6 refills | Status: DC

## 2024-05-31 MED ORDER — TORSEMIDE 20 MG PO TABS: 60.0000 mg | ORAL_TABLET | Freq: Two times a day (BID) | ORAL | 3 refills | Status: DC

## 2024-05-31 MED ORDER — TORSEMIDE 20 MG PO TABS: 60.0000 mg | ORAL_TABLET | Freq: Two times a day (BID) | ORAL | 3 refills | Status: AC

## 2024-05-31 MED ORDER — FUROSCIX 80 MG/10ML ~~LOC~~ CTKT
80.0000 mg | CARTRIDGE | Freq: Every day | SUBCUTANEOUS | 0 refills | Status: AC
  Filled 2024-05-31: qty 4, 4d supply, fill #0

## 2024-05-31 MED ORDER — EMPAGLIFLOZIN 10 MG PO TABS: 10.0000 mg | ORAL_TABLET | Freq: Every day | ORAL | 3 refills | Status: AC

## 2024-05-31 MED ORDER — TORSEMIDE 20 MG PO TABS: 60.0000 mg | ORAL_TABLET | Freq: Two times a day (BID) | ORAL | 6 refills | Status: DC

## 2024-05-31 NOTE — Progress Notes (Signed)
 Specialty Pharmacy Refill Coordination Note  Joan [name] is a 68 y.o. female contacted today regarding refills of specialty medication(s) Furosemide  (Furoscix )   Patient requested Delivery   Delivery date: 06/02/24   Verified address: [address]   [place] [address]   Medication will be filled on: 06/01/24  Disenrolling as this is a one time fill.

## 2024-05-31 NOTE — Patient Instructions (Addendum)
 Stop Lasix . Start Torsemide 60 mg twice daily - Rx sent to [facility]. Re-start Jardiance 10 mg daily. Furoscix  has been sent and should be delivered to your home on Wednesday. Take one dose daily for three days - see below. Labs today - will call you if abnormal. Rx for BP cuff provided to you today. Return to Heart Failure TOC Clinic in one week - see below. Please call us  at [phone] if any questions or concerns prior to next appointment.      Your provider has order Furoscix  for you. This is an on-body infuser that gives you a dose of Furosemide .   It will be shipped to your home from [facility], they will call you before shipping  Ensure you write down the time you start your infusion so that if there is a problem you will know how long the infusion lasted  Use Furoscix  only AS DIRECTED by our office  Dosing Directions:   Day 1= 80 mg - HOLD TORSEMIDE ON THIS DAY  Day 2= 80 mg - HOLD TORSEMIDE ON THIS DAY  Day 3= 80 mg - HOLD TORSEMIDE ON THIS DAY   RE-START YOUR TORSEMIDE 60 MG TWICE DAILY THE DAY AFTER YOU COMPLETE YOUR FUROSCIX .

## 2024-05-31 NOTE — Progress Notes (Signed)
 Provided patient education on Furoscix using demo kits and Furoscix video, QR code provided on AVS for further viewing. Furoscix order submitted online, ov note and ins card uploaded to [employer].

## 2024-05-31 NOTE — Telephone Encounter (Signed)
 Advanced Heart Failure Patient Advocate Encounter  Prior authorization for Furoscix  has been submitted and approved. Test billing returns $0 for up to 90 day supply.  Key: [id number] Effective: 07/16/2023 to 07/14/2025  Rachel DEL, CPhT Rx Patient Advocate Phone: [phone]

## 2024-05-31 NOTE — Progress Notes (Signed)
 HEART & VASCULAR TRANSITION OF CARE CONSULT NOTE   Referring Physician: Dr. Mcarthur PCP: Dr. Leonce, [facility] Cardiologist: Jennifer JONELLE Crape, MD  HPI: Referred to clinic by Dr. Sigdel for heart failure consultation.   Joan [name] is a 68 y.o. female with history HFpEF, CKD 3b, HTN, DVT on eliquis , aortic stenosis, HLD, obesity, OSA.  Echo 12/2021 showed EF 50-55% with G1DD, and mild aortic stenosis. Has followed with Dr Crape, [facility].   She was seen in [facility] ED 5/25 with lower extremity edema and UTI, reported prior diagnosis of lymphedema. She was given antibiotics, lasix  was increased and she followed up with her PCP.  Most recently was admitted to [facility] 10/25 for CHF exacerbation. Echo showed EF 45-50%, G1DD, mildly reduced RV, mod MR, mils AS. She was diuresed and GDMT adjusted.   Today she presents for transition of care visit. Overall feeling full of fluid. Weight is up 15lbs in 1 week. She is not peeing much with her lasix . Recently saw PCP who stopped some of her medications. Reports dyspnea, fatigue, lower extremity edema, and orthopnea. Denies chest pain, near-syncope, palpitations, and dizziness. Arrived by wheelchair. Appetite okay. Would like a scale and BP cuff. Compliant with all medications, although was confused about eliquis  and was taking 10 mg once daily. She has increase her lasix  on her own to 60 mg bid without robust diuresis. Denies ETOH, tobacco, or drug use.   Past Medical History:  Diagnosis Date   Abnormal glucose 05/26/2019   Acid reflux    Acute bronchitis 08/05/2022   Acute pulmonary embolism (HCC) 01/21/2020   Age-related nuclear cataract of both eyes 03/07/2017   Anemia 08/05/2022   Antalgic gait 04/04/2023   Breast lump on left side at 4 o'clock position 06/28/2018   CAP (community acquired pneumonia) 08/05/2022   Chronic pain of both knees 11/02/2018   Class 3 severe obesity without serious comorbidity with body mass  index (BMI) of 50.0 to 59.9 in adult [facility]) 01/26/2020   Complete tear of right rotator cuff 11/12/2016   Cough 01/26/2020   Diabetes mellitus type 2 without retinopathy (HCC) 03/07/2017   Diabetes mellitus without complication (HCC)    Pt states she is prediabetic   DVT of deep femoral vein, left (HCC) 01/26/2020   Essential hypertension 06/28/2018   Fall from slipping on ice 07/26/2019   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   Functional urinary incontinence 06/28/2018   Hypertension    Mixed hyperlipidemia 05/26/2019   Myopia with astigmatism and presbyopia, bilateral 03/07/2017   OSA (obstructive sleep apnea)    Primary localized osteoarthrosis of the knee, right 11/12/2016   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     albuterol  (VENTOLIN  HFA) 108 (90 Base) MCG/ACT inhaler Inhale 1-2 puffs into the lungs every 6 (six) hours as needed for wheezing or shortness of breath. 6.7 g 0   apixaban  (ELIQUIS ) 5 MG TABS tablet Take 1 tablet (5 mg total) by mouth 2 (two) times daily. (Patient taking differently: Take 10 mg by mouth daily.) 60 tablet 11   cyclobenzaprine (FLEXERIL) 5 MG tablet  Take 5 mg by mouth 3 (three) times daily as needed for muscle spasms.     ezetimibe -simvastatin  (VYTORIN ) 10-40 MG tablet Take 1 tablet by mouth daily. 90 tablet 1   Fe Fum-FePoly-FA-Vit C-Vit B3 (INTEGRA F ) 125-1 MG CAPS Take 1 tablet by mouth daily. (Patient taking differently: Take 1 tablet by mouth See admin instructions. Take 1 capsule by mouth 1 or 2 times a week.) 30 capsule 2   Furosemide  (FUROSCIX ) 80 MG/10ML CTKT Inject 80 mg into the skin daily for 3 doses. 4 each 0    [Paused] hydrALAZINE (APRESOLINE) 25 MG tablet Take 25 mg by mouth daily.     loratadine (CLARITIN) 10 MG tablet Take 1 tablet (10 mg total) by mouth daily as needed for allergies. 15 tablet 0   [Paused] losartan-hydrochlorothiazide  (HYZAAR) 100-25 MG tablet Take 1 tablet by mouth daily.     methocarbamol  (ROBAXIN ) 750 MG tablet Take 750 mg by mouth 2 (two) times daily as needed for muscle spasms.     metoprolol succinate (TOPROL-XL) 50 MG 24 hr tablet Take 1 tablet (50 mg total) by mouth daily. Take with or immediately following a meal. 30 tablet 0   MOUNJARO 5 MG/0.5ML Pen Inject 5 mg into the skin every Friday.     Multiple Vitamins-Minerals (EMERGEN-C VITAMIN C) PACK Take 1 packet by mouth daily as needed (immune support).     MYRBETRIQ 50 MG TB24 tablet Take 50 mg by mouth daily.     omeprazole (PRILOSEC) 40 MG capsule Take 40 mg by mouth daily.     OVER THE COUNTER MEDICATION Take 1 tablet by mouth daily as needed (congested lymphs). OTC natural lymph supplment     solifenacin  (VESICARE ) 10 MG tablet Take 1 tablet by mouth once daily 30 tablet 0   traMADol  (ULTRAM ) 50 MG tablet Take 1 tablet (50 mg total) by mouth every 6 (six) hours as needed. 30 tablet    Vitamin D , Ergocalciferol , (DRISDOL ) 1.25 MG (50000 UNIT) CAPS capsule Take 1 capsule (50,000 Units total) by mouth every 7 (seven) days. (Patient taking differently: Take 50,000 Units by mouth every Sunday.) 12 capsule 0   empagliflozin (JARDIANCE) 10 MG TABS tablet Take 1 tablet (10 mg total) by mouth daily. 30 tablet 3   torsemide (DEMADEX) 20 MG tablet Take 3 tablets (60 mg total) by mouth 2 (two) times daily. 90 tablet 3   No current facility-administered medications for this encounter.    Allergies  Allergen Reactions   Penicillins Hives   Zestril  [Lisinopril ] Cough      Social History   Socioeconomic History   Marital status: Widowed    Spouse name: Not on file   Number of children: 2   Years of education: Not on  file   Highest education level: Associate degree: occupational, scientist, product/process development, or vocational program  Occupational History   Occupation: CNA  Tobacco Use   Smoking status: Never   Smokeless tobacco: Never  Vaping Use   Vaping status: Not on file  Substance and Sexual Activity   Alcohol use: Not Currently    Comment: socially   Drug use: Never   Sexual activity: Not on file  Other Topics Concern   Not on file  Social History Narrative   ** Merged History Encounter **       Social Drivers of Health   Financial Resource Strain: Low Risk  (05/13/2024)   Overall Financial Resource Strain (CARDIA)    Difficulty of Paying Living Expenses: Not hard  at all  Food Insecurity: No Food Insecurity (05/24/2024)   Received from [facility]   Hunger Vital Sign    Within the past 12 months, you worried that your food would run out before you got the money to buy more.: Never true    Within the past 12 months, the food you bought just didn't last and you didn't have money to get more.: Never true  Transportation Needs: No Transportation Needs (05/24/2024)   Received from [facility] - Transportation    Lack of Transportation (Medical): No    Lack of Transportation (Non-Medical): No  Physical Activity: Not on file  Stress: Not on file  Social Connections: Unknown (05/14/2024)   Social Connection and Isolation Panel    Frequency of Communication with Friends and Family: Never    Frequency of Social Gatherings with Friends and Family: Never    Attends Religious Services: Never    Database Administrator or Organizations: No    Attends Banker Meetings: Never    Marital Status: Patient declined  Catering Manager Violence: Not At Risk (05/14/2024)   Humiliation, Afraid, Rape, and Kick questionnaire    Fear of Current or Ex-Partner: No    Emotionally Abused: No    Physically Abused: No    Sexually Abused: No      Family History  Problem Relation Age of Onset    Heart disease Mother    Heart disease Father    Prostate cancer Father    Vitals:   05/31/24 1459  BP: (!) 152/84  Pulse: 71  SpO2: 95%  Weight: 133.4 kg (294 lb)  Height: 5' 6 (1.676 m)    Filed Weights   05/31/24 1459  Weight: 133.4 kg (294 lb)    PHYSICAL EXAM: General: Obese appearing. No distress  Cardiac: JVP to jaw. Systolic murmur  Resp: Lung sounds clear and equal B/L Abdomen: Soft, non-distended.  Extremities: Warm and dry.  3+ edema to thighs and abdomen.  Neuro: A&O x3. Affect pleasant.   ASSESSMENT & PLAN:  Chronic HFmrEF - 45-50%, G1DD, mildly reduced RV, mod MR, mild AS - NYHA IIIb.Significantly volume overloaded, up 15 lbs from last weeks PCP appointment - take Furoscix  80 mg daily + KCL 40 mEq x3 days, hold torsemide on these days - home delivery should arrive on Wednesday - stop Lasix ; start Torsemide 60 mg daily, only hold on furoscix  days - GDMT: ? blocker: continue toprol XL 50 mg daily ARB/ARNI: stop hyzaar with renal function MRA: limited by Cr SGLT2i: restart jardiance 10 mg daily Vasoldilators: likely will need to resume hydral; will wait until after diuresed further for renal perfusion  MR - mod on echo 10/25 - suspect functional  AS - mild, stable  Ascending aortic aneurysm - 4.2 cm on echo - need annual surveillance by CT  CKD 3b - baseline Cr appears around 1.5-1.6.  - Up to 2.02 at PCP appointment 11/10 - medication changes as above - BMET/BNP today  HTN  - BP elevated today - plan as above  OSA - noncompliant with CPAP (machine broke in Feb)  Obesity - Body mass index is 47.45 kg/m. - on mounjaro   H/o DVT - on eliquis ; has been taking 10 mg once daily; dicussed importance of taking 5 mg bid  Referred to HFSW (PCP, Medications, Transportation, ETOH Abuse, Drug Abuse, Insurance, Surveyor, Quantity ): No Refer to Pharmacy: Yes  Refer to Home Health: No Refer to Advanced Heart  Failure Clinic: No  Refer to General  Cardiology: Yes  FUROSCIX  prescribed  Patient viewed patient education video with QR code for FUROSCIX    QR code for FUROSCIX  placed on AVS  Call FUROSCIX  Direct at [phone] for questions regarding on body infuser.  Follow up with TOC Clinic in 1 week after Furoscix  (volume and BMET), weight is increasing rapidly. Referred to [facility] for long term management.   Angelik Walls, NP 05/31/24

## 2024-06-01 ENCOUNTER — Other Ambulatory Visit (HOSPITAL_COMMUNITY): Payer: Self-pay

## 2024-06-01 ENCOUNTER — Other Ambulatory Visit: Payer: Self-pay

## 2024-06-01 ENCOUNTER — Ambulatory Visit (HOSPITAL_COMMUNITY): Payer: Self-pay | Admitting: Cardiology

## 2024-06-02 ENCOUNTER — Encounter (HOSPITAL_COMMUNITY): Payer: Self-pay

## 2024-06-02 MED ORDER — POTASSIUM CHLORIDE CRYS ER 20 MEQ PO TBCR: 40.0000 meq | EXTENDED_RELEASE_TABLET | Freq: Every day | ORAL | 3 refills | Status: DC

## 2024-06-02 NOTE — Telephone Encounter (Addendum)
 Pt aware, agreeable, and verbalized understanding  Rx sent to pharmacy   ----- Message from Jordan Lee sent at 06/01/2024  7:37 AM EST ----- Fluid marker up. She was supposed to have been given a prescription for KCL 40 mEq to take with Furoscix . I do not see that this was ordered or placed in instructions, please order KCL and confirm  patient understands diuretic/KCL plan. ----- Message ----- From: Rebecka, Lab In [facility] Sent: 05/31/2024   4:57 PM EST To: Jordan Lee, NP

## 2024-06-08 ENCOUNTER — Ambulatory Visit (HOSPITAL_COMMUNITY)
Admission: RE | Admit: 2024-06-08 | Discharge: 2024-06-08 | Disposition: A | Discharge status: Home / Self Care | Source: Ambulatory Visit | Attending: Internal Medicine | Admitting: Internal Medicine

## 2024-06-08 ENCOUNTER — Encounter (HOSPITAL_COMMUNITY): Payer: Self-pay

## 2024-06-08 ENCOUNTER — Telehealth (HOSPITAL_COMMUNITY): Payer: Self-pay

## 2024-06-08 VITALS — BP 140/76 | HR 88 | Wt 379.4 lb

## 2024-06-08 DIAGNOSIS — Z79899 Other long term (current) drug therapy: Secondary | ICD-10-CM | POA: Diagnosis not present

## 2024-06-08 DIAGNOSIS — E1122 Type 2 diabetes mellitus with diabetic chronic kidney disease: Secondary | ICD-10-CM | POA: Insufficient documentation

## 2024-06-08 DIAGNOSIS — G4733 Obstructive sleep apnea (adult) (pediatric): Secondary | ICD-10-CM | POA: Diagnosis not present

## 2024-06-08 DIAGNOSIS — Z91198 Patient's noncompliance with other medical treatment and regimen for other reason: Secondary | ICD-10-CM | POA: Diagnosis not present

## 2024-06-08 DIAGNOSIS — N1832 Chronic kidney disease, stage 3b: Secondary | ICD-10-CM | POA: Insufficient documentation

## 2024-06-08 DIAGNOSIS — I5022 Chronic systolic (congestive) heart failure: Secondary | ICD-10-CM | POA: Insufficient documentation

## 2024-06-08 DIAGNOSIS — I7121 Aneurysm of the ascending aorta, without rupture: Secondary | ICD-10-CM | POA: Diagnosis not present

## 2024-06-08 DIAGNOSIS — E8770 Fluid overload, unspecified: Secondary | ICD-10-CM | POA: Diagnosis not present

## 2024-06-08 DIAGNOSIS — Z7984 Long term (current) use of oral hypoglycemic drugs: Secondary | ICD-10-CM | POA: Diagnosis not present

## 2024-06-08 DIAGNOSIS — Z6841 Body Mass Index (BMI) 40.0 and over, adult: Secondary | ICD-10-CM | POA: Diagnosis not present

## 2024-06-08 DIAGNOSIS — I340 Nonrheumatic mitral (valve) insufficiency: Secondary | ICD-10-CM | POA: Diagnosis not present

## 2024-06-08 DIAGNOSIS — E782 Mixed hyperlipidemia: Secondary | ICD-10-CM | POA: Insufficient documentation

## 2024-06-08 DIAGNOSIS — Z7901 Long term (current) use of anticoagulants: Secondary | ICD-10-CM | POA: Diagnosis not present

## 2024-06-08 DIAGNOSIS — Z86718 Personal history of other venous thrombosis and embolism: Secondary | ICD-10-CM | POA: Diagnosis not present

## 2024-06-08 DIAGNOSIS — E66813 Obesity, class 3: Secondary | ICD-10-CM | POA: Diagnosis not present

## 2024-06-08 DIAGNOSIS — I10 Essential (primary) hypertension: Secondary | ICD-10-CM

## 2024-06-08 DIAGNOSIS — I350 Nonrheumatic aortic (valve) stenosis: Secondary | ICD-10-CM | POA: Insufficient documentation

## 2024-06-08 DIAGNOSIS — Z7985 Long-term (current) use of injectable non-insulin antidiabetic drugs: Secondary | ICD-10-CM | POA: Diagnosis not present

## 2024-06-08 DIAGNOSIS — I110 Hypertensive heart disease with heart failure: Secondary | ICD-10-CM | POA: Diagnosis not present

## 2024-06-08 LAB — BASIC METABOLIC PANEL WITH GFR
Anion gap: 12 (ref 5–15)
BUN: 38 mg/dL — ABNORMAL HIGH (ref 8–23)
CO2: 26 mmol/L (ref 22–32)
Calcium: 9.2 mg/dL (ref 8.9–10.3)
Chloride: 99 mmol/L (ref 98–111)
Creatinine, Ser: 2.08 mg/dL — ABNORMAL HIGH (ref 0.44–1.00)
GFR, Estimated: 26 mL/min — ABNORMAL LOW (ref >60)
Glucose, Bld: 96 mg/dL (ref 70–99)
Potassium: 4.1 mmol/L (ref 3.5–5.1)
Sodium: 137 mmol/L (ref 135–145)

## 2024-06-08 LAB — MAGNESIUM: Magnesium: 2 mg/dL (ref 1.7–2.4)

## 2024-06-08 LAB — BRAIN NATRIURETIC PEPTIDE: B Natriuretic Peptide: 154 pg/mL — ABNORMAL HIGH (ref 0.0–100.0)

## 2024-06-08 MED ORDER — METOLAZONE 2.5 MG PO TABS: 2.5000 mg | ORAL_TABLET | Freq: Every day | ORAL | 3 refills | Status: DC

## 2024-06-08 MED ORDER — METOLAZONE 5 MG PO TABS: 5.0000 mg | ORAL_TABLET | Freq: Every day | ORAL | 3 refills | Status: DC

## 2024-06-08 MED ORDER — HYDRALAZINE HCL 25 MG PO TABS: 12.5000 mg | ORAL_TABLET | Freq: Three times a day (TID) | ORAL | 3 refills | Status: DC

## 2024-06-08 NOTE — Telephone Encounter (Signed)
 Called to confirm/remind patient of their appointment at the Advanced Heart Failure Clinic on 06/08/24 3:30.   Appointment:   [] Confirmed  [x] Left mess   [] No answer/No voice mail  [] VM Full/unable to leave message  [] Phone not in service  Patient reminded to bring all medications and/or complete list.  Confirmed patient has transportation. Gave directions, instructed to utilize valet parking.

## 2024-06-08 NOTE — Patient Instructions (Addendum)
 Good to see you today!  Continue torsemide  60 mg Twice daily  Metolazone  5 mg mg daily x 2 days with additional potassium 40 meq with each dose  START hydralazine  12.5 mg (1/2 tablet) Three times a day  TAKE Eliquis  5 mg Twice daily  Please follow 64 oz fluid restriction daily  Labs done today, your results will be available in MyChart, we will contact you for abnormal readings.    You have been referred to cardiology they will call  to schedule an appointment   If you have any questions or concerns before your next appointment please send us  a message through [facility] or call our office at [phone].    TO LEAVE A MESSAGE FOR THE NURSE SELECT OPTION 2, PLEASE LEAVE A MESSAGE INCLUDING: YOUR NAME DATE OF BIRTH CALL BACK NUMBER REASON FOR CALL**this is important as we prioritize the call backs  YOU WILL RECEIVE A CALL BACK THE SAME DAY AS LONG AS YOU CALL BEFORE 4:00 PM At the Advanced Heart Failure Clinic, you and your health needs are our priority. As part of our continuing mission to provide you with exceptional heart care, we have created designated Provider Care Teams. These Care Teams include your primary Cardiologist (physician) and Advanced Practice Providers (APPs- Physician Assistants and Nurse Practitioners) who all work together to provide you with the care you need, when you need it.   You may see any of the following providers on your designated Care Team at your next follow up: Dr Toribio Fuel Dr Ezra Shuck Dr. Morene Brownie Greig Mosses, NP Caffie Shed, [place] [facility] [place], [place] Beckey Coe, NP Jordan Lee, NP Ellouise Class, NP Tinnie Redman, PharmD Jaun Bash, PharmD   Please be sure to bring in all your medications bottles to every appointment.    Thank you for choosing [facility]

## 2024-06-08 NOTE — Progress Notes (Signed)
 HEART IMPACT TRANSITIONS OF CARE    PCP: Dr. Leonce, [facility]  Primary Cardiologist: Jennifer JONELLE Crape, MD   Chief Complaint: Heart failure  HPI: Joan [name] is a 68 y.o. female with history HFpEF, CKD 3b, HTN, DVT on eliquis , aortic stenosis, HLD, obesity, OSA.   Echo 12/2021 showed EF 50-55% with G1DD, and mild aortic stenosis. Has followed with Dr Crape, [facility].    She was seen in [facility] ED 5/25 with lower extremity edema and UTI, reported prior diagnosis of lymphedema. She was given antibiotics, lasix  was increased and she followed up with her PCP.   Admitted to [facility] 10/25 for CHF exacerbation. Echo showed EF 45-50%, G1DD, mildly reduced RV, mod MR, mils AS. She was diuresed and GDMT adjusted.   Initial TOC visit 05/31/24. Volume overloaded, given 3 days of furoscix  and diuretics adjusted.   Today she returns for AHF [facility] clinic visit with her boyfriend. Overall feeling tired. Denies palpitations, CP, dizziness, or PND/Orthopnea. BLE edema. Appetite good, tries to watch what she eats and avoids salt as much as possible. Unable to weigh at home, does not have a scale. Taking all medications. Denies ETOH, tobacco or drug use. Reports she cheated and ate churches fried chicken and spaghetti yesterday. Feels very slow now and gets SOB with walking short distances. Drinks under 64 oz.   We helped her to the restroom during this visit (wheeled her in/out) and she indeed get very SOB with this.   ROS: All systems negative except as listed in HPI, PMH and Problem List.  SH:  Social History   Socioeconomic History   Marital status: Widowed    Spouse name: Not on file   Number of children: 2   Years of education: Not on file   Highest education level: Associate degree: occupational, scientist, product/process development, or vocational program  Occupational History   Occupation: CNA  Tobacco Use   Smoking status: Never   Smokeless tobacco: Never  Vaping Use   Vaping status: Not on  file  Substance and Sexual Activity   Alcohol use: Not Currently    Comment: socially   Drug use: Never   Sexual activity: Not on file  Other Topics Concern   Not on file  Social History Narrative   ** Merged History Encounter **       Social Drivers of Health   Financial Resource Strain: Low Risk  (05/13/2024)   Overall Financial Resource Strain (CARDIA)    Difficulty of Paying Living Expenses: Not hard at all  Food Insecurity: No Food Insecurity (05/24/2024)   Received [facility]   Hunger Vital Sign    Within the past 12 months, you worried that your food would run out before you got the money to buy more.: Never true    Within the past 12 months, the food you bought just didn't last and you didn't have money to get more.: Never true  Transportation Needs: No Transportation Needs (05/24/2024)   Received from [facility] - Transportation    Lack of Transportation (Medical): No    Lack of Transportation (Non-Medical): No  Physical Activity: Not on file  Stress: Not on file  Social Connections: Unknown (05/14/2024)   Social Connection and Isolation Panel    Frequency of Communication with Friends and Family: Never    Frequency of Social Gatherings with Friends and Family: Never    Attends Religious Services: Never    Active Member  of Clubs or Organizations: No    Attends Banker Meetings: Never    Marital Status: Patient declined  Intimate Partner Violence: Not At Risk (05/14/2024)   Humiliation, Afraid, Rape, and Kick questionnaire    Fear of Current or Ex-Partner: No    Emotionally Abused: No    Physically Abused: No    Sexually Abused: No    FH:  Family History  Problem Relation Age of Onset   Heart disease Mother    Heart disease Father    Prostate cancer Father     Past Medical History:  Diagnosis Date   Abnormal glucose 05/26/2019   Acid reflux    Acute bronchitis 08/05/2022   Acute pulmonary embolism (HCC) 01/21/2020    Age-related nuclear cataract of both eyes 03/07/2017   Anemia 08/05/2022   Antalgic gait 04/04/2023   Breast lump on left side at 4 o'clock position 06/28/2018   CAP (community acquired pneumonia) 08/05/2022   Chronic pain of both knees 11/02/2018   Class 3 severe obesity without serious comorbidity with body mass index (BMI) of 50.0 to 59.9 in adult (HCC) 01/26/2020   Complete tear of right rotator cuff 11/12/2016   Cough 01/26/2020   Diabetes mellitus type 2 without retinopathy (HCC) 03/07/2017   Diabetes mellitus without complication (HCC)    Pt states she is prediabetic   DVT of deep femoral vein, left (HCC) 01/26/2020   Essential hypertension 06/28/2018   Fall from slipping on ice 07/26/2019   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   Functional urinary incontinence 06/28/2018   Hypertension    Mixed hyperlipidemia 05/26/2019   Myopia with astigmatism and presbyopia, bilateral 03/07/2017   OSA (obstructive sleep apnea)    Primary localized osteoarthrosis of the knee, right 11/12/2016   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     albuterol  (VENTOLIN  HFA) 108 (90 Base) MCG/ACT inhaler Inhale 1-2 puffs into the lungs every 6 (six) hours as needed for wheezing or shortness of breath. 6.7 g 0   apixaban  (ELIQUIS ) 5 MG TABS tablet Take 1 tablet (5 mg total) by mouth 2 (two) times daily. 60 tablet 11   cyclobenzaprine (FLEXERIL) 5 MG tablet Take 5 mg by mouth 3 (three) times daily as needed for muscle spasms.     empagliflozin  (JARDIANCE ) 10 MG TABS tablet Take 1 tablet (10 mg total) by mouth daily. 30 tablet 3   ezetimibe -simvastatin  (VYTORIN ) 10-40 MG  tablet Take 1 tablet by mouth daily. 90 tablet 1   Fe Fum-FePoly-FA-Vit C-Vit B3 (INTEGRA F ) 125-1 MG CAPS Take 1 tablet by mouth daily. 30 capsule 2   Furosemide  (FUROSCIX ) 80 MG/10ML CTKT Inject 80 mg into the skin daily for 3 doses. 4 each 0   loratadine  (CLARITIN ) 10 MG tablet Take 1 tablet (10 mg total) by mouth daily as needed for allergies. 15 tablet 0   methocarbamol  (ROBAXIN ) 750 MG tablet Take 750 mg by mouth 2 (two) times daily as needed for muscle spasms.     metoprolol  succinate (TOPROL -XL) 50 MG 24 hr tablet Take 1 tablet (50 mg total) by mouth daily. Take with or immediately  following a meal. 30 tablet 0   MOUNJARO 5 MG/0.5ML Pen Inject 5 mg into the skin every Friday.     Multiple Vitamins-Minerals (EMERGEN-C VITAMIN C) PACK Take 1 packet by mouth daily as needed (immune support).     omeprazole  (PRILOSEC) 40 MG capsule Take 40 mg by mouth daily.     OVER THE COUNTER MEDICATION Take 1 tablet by mouth daily as needed (congested lymphs). OTC natural lymph supplment     potassium chloride  SA (KLOR-CON  M) 20 MEQ tablet Take 2 tablets (40 mEq total) by mouth daily. 180 tablet 3   solifenacin  (VESICARE ) 10 MG tablet Take 1 tablet by mouth once daily 30 tablet 0   torsemide  (DEMADEX ) 20 MG tablet Take 3 tablets (60 mg total) by mouth 2 (two) times daily. 90 tablet 3   traMADol  (ULTRAM ) 50 MG tablet Take 1 tablet (50 mg total) by mouth every 6 (six) hours as needed. 30 tablet    Vitamin D , Ergocalciferol , (DRISDOL ) 1.25 MG (50000 UNIT) CAPS capsule Take 1 capsule (50,000 Units total) by mouth every 7 (seven) days. 12 capsule 0   [Paused] hydrALAZINE  (APRESOLINE ) 25 MG tablet Take 25 mg by mouth daily. (Patient not taking: Reported on 06/08/2024)     [Paused] losartan -hydrochlorothiazide  (HYZAAR) 100-25 MG tablet Take 1 tablet by mouth daily. (Patient not taking: Reported on 06/08/2024)     MYRBETRIQ 50 MG TB24 tablet Take 50 mg by mouth daily. (Patient not taking: Reported on 06/08/2024)      No current facility-administered medications for this encounter.    Vitals:   06/08/24 1550  BP: (!) 140/76  Pulse: 88  SpO2: 96%  Weight: (!) 172.1 kg (379 lb 6.4 oz)    PHYSICAL EXAM: General:  obese appearing.  conversational dyspnea, esp post activity. Arrived in [facility] [place]: JVD to  jaw.  Cor: Regular rate & rhythm. No murmurs. Lungs: diminished Extremities: +1-2 BLE edema  Neuro: alert & oriented x 3. Affect pleasant.   Wt Readings from Last 3 Encounters:  06/08/24 (!) 172.1 kg (379 lb 6.4 oz)  05/31/24 133.4 kg (294 lb)  05/21/24 (!) 170 kg (374 lb 12.5 oz)    ASSESSMENT & PLAN: Chronic HFmrEF - 45-50%, G1DD, mildly reduced RV, mod MR, mild AS - NYHA IIIb. - Did 3 Furoscix  last week, reports good UOP. Suspect 11/17 weight off by 100 lbs.  - Remains volume overloaded despite weight being down 15lbs.  - Continue Torsemide  60 mg BID. Will give metolazone  5 mg x 2 days + 40 mEq KDUR. Check BMET/BNP today. Repeat labs at f/u next week.  - GDMT: ? blocker: continue toprol  XL 50 mg daily, next time would reduce BB and continue hydral while volume overloaded.  ARB/ARNI: holding hyzaar with renal function MRA: limited by Cr SGLT2i: Continue jardiance  10 mg daily Vasoldilators: Restart hydral 12.5 mg TID today   MR - mod on echo 10/25 - suspect functional   AS - mild, stable   Ascending aortic aneurysm - 4.2 cm on echo - need annual surveillance by CT   CKD 3b - Baseline Cr appears around 1.5-1.6.  - Last 1.78 - BMET/BNP today   HTN  - BP elevated today - plan as above   OSA - Noncompliant with CPAP (machine broke in Feb) - Reports that she got her mask today and can restart.    Obesity - Body mass index is 61.24 kg/m.  - on mounjaro, did not take it last Friday because she forgot  H/o DVT - on eliquis ; she is still taking 10 mg once daily; again, dicussed importance of taking 5 mg bid today  Provided her with a scale (400lb limit), pill box (to  help with medication compliance, esp BID/TID meds), and pill splitter.   Will bring back for 1 more TOC visit to reassess volume status next week. Needs to be compliant with meds, diet and volume intake.   Follow up with [facility], will place referral today.

## 2024-06-09 ENCOUNTER — Ambulatory Visit (HOSPITAL_COMMUNITY)

## 2024-06-09 ENCOUNTER — Ambulatory Visit (HOSPITAL_COMMUNITY): Payer: Self-pay | Admitting: Internal Medicine

## 2024-06-14 ENCOUNTER — Telehealth (HOSPITAL_COMMUNITY): Payer: Self-pay

## 2024-06-14 NOTE — Telephone Encounter (Signed)
 Called to confirm/remind patient of their appointment at the [facility] on 05/16/24 1:30.   Appointment:   [] Confirmed  [x] Left mess   [] No answer/No voice mail  [] VM Full/unable to leave message  [] Phone not in service  Patient reminded to bring all medications and/or complete list.  Confirmed patient has transportation. Gave directions, instructed to utilize valet parking.

## 2024-06-15 ENCOUNTER — Ambulatory Visit (HOSPITAL_COMMUNITY)

## 2024-06-15 NOTE — Progress Notes (Incomplete)
 HEART IMPACT TRANSITIONS OF CARE    PCP: Dr. Leonce, [facility]  Primary Cardiologist: Jennifer JONELLE Crape, MD   Chief Complaint: Heart failure  HPI: Joan [name] is a 68 y.o. female with history HFmrEF, CKD 3b, HTN, DVT on eliquis , aortic stenosis, HLD, obesity, OSA.   Echo 12/2021 showed EF 50-55% with G1DD, and mild aortic stenosis. Has followed with Dr Crape, [facility] in [place].    She was seen in [facility] ED 5/25 with lower extremity edema and UTI, reported prior diagnosis of lymphedema. She was given antibiotics, lasix  was increased and she followed up with her PCP.   Admitted to [facility] 10/25 for CHF exacerbation. Echo showed EF 45-50%, G1DD, mildly reduced RV, mod MR, mild AS. She was diuresed and GDMT adjusted.   Initial TOC visit 05/31/24. Volume overloaded, given 3 days of furoscix  and diuretics adjusted. Remained volume up at follow-up 11/25 despite diuresing 15 lb. She was instrcuted to take 5 mg metolazone  X 2 days.   ROS: All systems negative except as listed in HPI, PMH and Problem List.  SH:  Social History   Socioeconomic History   Marital status: Widowed    Spouse name: Not on file   Number of children: 2   Years of education: Not on file   Highest education level: Associate degree: occupational, scientist, product/process development, or vocational program  Occupational History   Occupation: CNA  Tobacco Use   Smoking status: Never   Smokeless tobacco: Never  Vaping Use   Vaping status: Not on file  Substance and Sexual Activity   Alcohol use: Not Currently    Comment: socially   Drug use: Never   Sexual activity: Not on file  Other Topics Concern   Not on file  Social History Narrative   ** Merged History Encounter **       Social Drivers of Health   Financial Resource Strain: Low Risk  (05/13/2024)   Overall Financial Resource Strain (CARDIA)    Difficulty of Paying Living Expenses: Not hard at all  Food Insecurity: No Food Insecurity (05/24/2024)    Received from [facility]   Hunger Vital Sign    Within the past 12 months, you worried that your food would run out before you got the money to buy more.: Never true    Within the past 12 months, the food you bought just didn't last and you didn't have money to get more.: Never true  Transportation Needs: No Transportation Needs (05/24/2024)   Received from [facility] - Transportation    Lack of Transportation (Medical): No    Lack of Transportation (Non-Medical): No  Physical Activity: Not on file  Stress: Not on file  Social Connections: Unknown (05/14/2024)   Social Connection and Isolation Panel    Frequency of Communication with Friends and Family: Never    Frequency of Social Gatherings with Friends and Family: Never    Attends Religious Services: Never    Database Administrator or Organizations: No    Attends Banker Meetings: Never    Marital Status: Patient declined  Catering Manager Violence: Not At Risk (05/14/2024)   Humiliation, Afraid, Rape, and Kick questionnaire    Fear of Current or Ex-Partner: No    Emotionally Abused: No    Physically Abused: No    Sexually Abused: No    FH:  Family History  Problem Relation Age of Onset   Heart disease Mother  Heart disease Father    Prostate cancer Father     Past Medical History:  Diagnosis Date   Abnormal glucose 05/26/2019   Acid reflux    Acute bronchitis 08/05/2022   Acute pulmonary embolism (HCC) 01/21/2020   Age-related nuclear cataract of both eyes 03/07/2017   Anemia 08/05/2022   Antalgic gait 04/04/2023   Breast lump on left side at 4 o'clock position 06/28/2018   CAP (community acquired pneumonia) 08/05/2022   Chronic pain of both knees 11/02/2018   Class 3 severe obesity without serious comorbidity with body mass index (BMI) of 50.0 to 59.9 in adult [facility]) 01/26/2020   Complete tear of right rotator cuff 11/12/2016   Cough 01/26/2020   Diabetes mellitus type 2  without retinopathy (HCC) 03/07/2017   Diabetes mellitus without complication (HCC)    Pt states she is prediabetic   DVT of deep femoral vein, left (HCC) 01/26/2020   Essential hypertension 06/28/2018   Fall from slipping on ice 07/26/2019   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   Functional urinary incontinence 06/28/2018   Hypertension    Mixed hyperlipidemia 05/26/2019   Myopia with astigmatism and presbyopia, bilateral 03/07/2017   OSA (obstructive sleep apnea)    Primary localized osteoarthrosis of the knee, right 11/12/2016   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     albuterol  (VENTOLIN  HFA) 108 (90 Base) MCG/ACT inhaler Inhale 1-2 puffs into the lungs every 6 (six) hours as needed for wheezing or shortness of breath. 6.7 g 0   apixaban  (ELIQUIS ) 5 MG TABS tablet Take 1 tablet (5 mg total) by mouth 2 (two) times daily. 60 tablet 11   cyclobenzaprine (FLEXERIL) 5 MG tablet Take 5 mg by mouth 3 (three) times daily as needed for muscle spasms.     empagliflozin  (JARDIANCE ) 10 MG TABS tablet Take 1 tablet (10 mg total) by mouth daily. 30 tablet 3   ezetimibe -simvastatin  (VYTORIN ) 10-40 MG tablet Take 1 tablet by mouth daily. 90 tablet 1   Fe Fum-FePoly-FA-Vit C-Vit B3 (INTEGRA F ) 125-1 MG CAPS Take 1 tablet by mouth daily. 30 capsule 2   Furosemide  (FUROSCIX ) 80 MG/10ML CTKT Inject 80 mg into the skin daily for 3 doses. 4 each 0   hydrALAZINE  (APRESOLINE ) 25 MG tablet Take 0.5 tablets (12.5 mg total) by mouth 3 (three) times daily. 90 tablet 3   loratadine  (CLARITIN ) 10 MG tablet Take 1 tablet (10 mg total) by mouth daily as needed for allergies. 15 tablet  0   [Paused] losartan -hydrochlorothiazide  (HYZAAR) 100-25 MG tablet Take 1 tablet by mouth daily. (Patient not taking: Reported on 06/08/2024)     methocarbamol  (ROBAXIN ) 750 MG tablet Take 750 mg by mouth 2 (two) times daily as needed for muscle spasms.     metolazone  (ZAROXOLYN ) 5 MG tablet Take 1 tablet (5 mg total) by mouth daily. 5 tablet 3   metoprolol  succinate (TOPROL -XL) 50 MG 24 hr tablet Take 1 tablet (50 mg total) by mouth daily. Take with or immediately following a meal. 30 tablet 0   MOUNJARO 5 MG/0.5ML Pen Inject 5 mg into the skin every Friday.  Multiple Vitamins-Minerals (EMERGEN-C VITAMIN C) PACK Take 1 packet by mouth daily as needed (immune support).     MYRBETRIQ 50 MG TB24 tablet Take 50 mg by mouth daily. (Patient not taking: Reported on 06/08/2024)     omeprazole  (PRILOSEC) 40 MG capsule Take 40 mg by mouth daily.     OVER THE COUNTER MEDICATION Take 1 tablet by mouth daily as needed (congested lymphs). OTC natural lymph supplment     potassium chloride  SA (KLOR-CON  M) 20 MEQ tablet Take 2 tablets (40 mEq total) by mouth daily. 180 tablet 3   solifenacin  (VESICARE ) 10 MG tablet Take 1 tablet by mouth once daily 30 tablet 0   torsemide  (DEMADEX ) 20 MG tablet Take 3 tablets (60 mg total) by mouth 2 (two) times daily. 90 tablet 3   traMADol  (ULTRAM ) 50 MG tablet Take 1 tablet (50 mg total) by mouth every 6 (six) hours as needed. 30 tablet    Vitamin D , Ergocalciferol , (DRISDOL ) 1.25 MG (50000 UNIT) CAPS capsule Take 1 capsule (50,000 Units total) by mouth every 7 (seven) days. 12 capsule 0   No current facility-administered medications for this visit.    There were no vitals filed for this visit.   PHYSICAL EXAM: General:  obese appearing.  conversational dyspnea, esp post activity. Arrived in [facility] [place]: JVD to  jaw.  Cor: Regular rate & rhythm. No murmurs. Lungs: diminished Extremities: +1-2 BLE edema  Neuro: alert & oriented x 3. Affect pleasant.   Wt  Readings from Last 3 Encounters:  06/08/24 (!) 172.1 kg (379 lb 6.4 oz)  05/31/24 133.4 kg (294 lb)  05/21/24 (!) 170 kg (374 lb 12.5 oz)    ASSESSMENT & PLAN: Chronic HFmrEF - 45-50%, G1DD, mildly reduced RV, mod MR, mild AS - NYHA IIIb. - Did 3 Furoscix  last week, reports good UOP. Suspect 11/17 weight off by 100 lbs.  - Remains volume overloaded despite weight being down 15lbs.  - Continue Torsemide  60 mg BID. Will give metolazone  5 mg x 2 days + 40 mEq KDUR. Check BMET/BNP today. Repeat labs at f/u next week.  - continue toprol  XL 50 mg daily - holding hyzaar with renal function - has not been on MRA d/t CKD  - Continue jardiance  10 mg daily - Restart hydral 12.5 mg TID today   MR - mod on echo 10/25 - suspect functional   AS - mild, stable   Ascending aortic aneurysm - 4.2 cm on echo - need annual surveillance by CT   CKD 3b - Baseline Cr appears around 1.5-1.6.  - Last 1.78 - BMET/BNP today   HTN  - BP elevated today - plan as above   OSA - Noncompliant with CPAP (machine broke in Feb) - Reports that she got her mask today and can restart.    Obesity - There is no height or weight on file to calculate BMI.  - on mounjaro   H/o DVT - on eliquis ; she is still taking 10 mg once daily; again, dicussed importance of taking 5 mg bid today  Provided her with a scale (400lb limit), pill box (to help with medication compliance, esp BID/TID meds), and pill splitter.   Will bring back for 1 more TOC visit to reassess volume status next week. Needs to be compliant with meds, diet and volume intake.   Follow up

## 2024-06-18 ENCOUNTER — Telehealth (HOSPITAL_COMMUNITY): Payer: Self-pay

## 2024-06-18 NOTE — Telephone Encounter (Signed)
 Called to confirm/remind patient of their appointment at the [facility] on 06/21/24 2:15.   Appointment:   [x] Confirmed  [] Left mess   [] No answer/No voice mail  [] VM Full/unable to leave message  [] Phone not in service  Patient reminded to bring all medications and/or complete list.  Confirmed patient has transportation. Gave directions, instructed to utilize valet parking.

## 2024-06-21 ENCOUNTER — Encounter (HOSPITAL_COMMUNITY): Payer: Self-pay

## 2024-06-21 ENCOUNTER — Ambulatory Visit (HOSPITAL_COMMUNITY): Admission: RE | Admit: 2024-06-21 | Discharge: 2024-06-21 | Attending: Internal Medicine

## 2024-06-21 ENCOUNTER — Ambulatory Visit (HOSPITAL_COMMUNITY): Payer: Self-pay | Admitting: Internal Medicine

## 2024-06-21 VITALS — BP 154/96 | HR 87 | Wt 380.8 lb

## 2024-06-21 DIAGNOSIS — I5022 Chronic systolic (congestive) heart failure: Secondary | ICD-10-CM

## 2024-06-21 DIAGNOSIS — I340 Nonrheumatic mitral (valve) insufficiency: Secondary | ICD-10-CM

## 2024-06-21 DIAGNOSIS — N1832 Chronic kidney disease, stage 3b: Secondary | ICD-10-CM

## 2024-06-21 DIAGNOSIS — E66813 Obesity, class 3: Secondary | ICD-10-CM

## 2024-06-21 DIAGNOSIS — I350 Nonrheumatic aortic (valve) stenosis: Secondary | ICD-10-CM | POA: Diagnosis not present

## 2024-06-21 DIAGNOSIS — Z6841 Body Mass Index (BMI) 40.0 and over, adult: Secondary | ICD-10-CM

## 2024-06-21 DIAGNOSIS — I10 Essential (primary) hypertension: Secondary | ICD-10-CM

## 2024-06-21 DIAGNOSIS — I7121 Aneurysm of the ascending aorta, without rupture: Secondary | ICD-10-CM

## 2024-06-21 DIAGNOSIS — G4733 Obstructive sleep apnea (adult) (pediatric): Secondary | ICD-10-CM

## 2024-06-21 DIAGNOSIS — Z86718 Personal history of other venous thrombosis and embolism: Secondary | ICD-10-CM

## 2024-06-21 LAB — BASIC METABOLIC PANEL WITH GFR
Anion gap: 10 (ref 5–15)
BUN: 37 mg/dL — ABNORMAL HIGH (ref 8–23)
CO2: 29 mmol/L (ref 22–32)
Calcium: 8.9 mg/dL (ref 8.9–10.3)
Chloride: 100 mmol/L (ref 98–111)
Creatinine, Ser: 2.16 mg/dL — ABNORMAL HIGH (ref 0.44–1.00)
GFR, Estimated: 25 mL/min — ABNORMAL LOW (ref >60)
Glucose, Bld: 123 mg/dL — ABNORMAL HIGH (ref 70–99)
Potassium: 3.6 mmol/L (ref 3.5–5.1)
Sodium: 139 mmol/L (ref 135–145)

## 2024-06-21 LAB — BRAIN NATRIURETIC PEPTIDE: B Natriuretic Peptide: 271.5 pg/mL — ABNORMAL HIGH (ref 0.0–100.0)

## 2024-06-21 MED ORDER — HYDRALAZINE HCL 25 MG PO TABS: 12.5000 mg | ORAL_TABLET | Freq: Three times a day (TID) | ORAL | 3 refills | Status: AC

## 2024-06-21 NOTE — Progress Notes (Signed)
 HEART IMPACT TRANSITIONS OF CARE    PCP: Pcp, No  Primary Cardiologist: Jennifer JONELLE Crape, MD   HPI: Joan [name] is a 68 y.o. female with history HFpEF, CKD 3b, HTN, DVT on eliquis , aortic stenosis, HLD, obesity, OSA.   Echo 12/2021 showed EF 50-55% with G1DD, and mild aortic stenosis. Has followed with Dr Crape, [facility].    She was seen in [facility] ED 5/25 with lower extremity edema and UTI, reported prior diagnosis of lymphedema. She was given antibiotics, lasix  was increased and she followed up with her PCP.   Admitted to [facility] 10/25 for CHF exacerbation. Echo showed EF 45-50%, G1DD, mildly reduced RV, mod MR, mils AS. She was diuresed and GDMT adjusted.    Initial TOC visit 05/31/24. Volume overloaded, given 3 days of furoscix  and diuretics adjusted.    Today she returns for AHF TOC follow up. Overall feeling ok, breathing feels better. Now using CPAP. Denies palpitations, CP, dizziness, or PND/Orthopnea. Edema in BLE but improving. SOB with activity. Appetite good. No fever or chills. Weight at home 370-375 pounds. Taking all medications but never started the hydralazine  I ordered last week. Just ordered compression socks from [employer], awaiting arrival. Trying to stay under 64 oz fluid/day. Cheats with her meals, ate a seafood boil yesterday to celebrate her birthday last week. Does not use salt at home.   ROS: All systems negative except as listed in HPI, PMH and Problem List.  SH:  Social History   Socioeconomic History   Marital status: Widowed    Spouse name: Not on file   Number of children: 2   Years of education: Not on file   Highest education level: Associate degree: occupational, scientist, product/process development, or vocational program  Occupational History   Occupation: CNA  Tobacco Use   Smoking status: Never   Smokeless tobacco: Never  Vaping Use   Vaping status: Not on file  Substance and Sexual Activity   Alcohol use: Not Currently    Comment: socially   Drug use:  Never   Sexual activity: Not on file  Other Topics Concern   Not on file  Social History Narrative   ** Merged History Encounter **       Social Drivers of Health   Financial Resource Strain: Low Risk  (05/13/2024)   Overall Financial Resource Strain (CARDIA)    Difficulty of Paying Living Expenses: Not hard at all  Food Insecurity: No Food Insecurity (05/24/2024)   Received from [facility]   Hunger Vital Sign    Within the past 12 months, you worried that your food would run out before you got the money to buy more.: Never true    Within the past 12 months, the food you bought just didn't last and you didn't have money to get more.: Never true  Transportation Needs: No Transportation Needs (05/24/2024)   Received from [facility] - Transportation    Lack of Transportation (Medical): No    Lack of Transportation (Non-Medical): No  Physical Activity: Not on file  Stress: Not on file  Social Connections: Unknown (05/14/2024)   Social Connection and Isolation Panel    Frequency of Communication with Friends and Family: Never    Frequency of Social Gatherings with Friends and Family: Never    Attends Religious Services: Never    Database Administrator or Organizations: No    Attends Banker Meetings: Never    Marital Status: Patient  declined  Intimate Partner Violence: Not At Risk (05/14/2024)   Humiliation, Afraid, Rape, and Kick questionnaire    Fear of Current or Ex-Partner: No    Emotionally Abused: No    Physically Abused: No    Sexually Abused: No    FH:  Family History  Problem Relation Age of Onset   Heart disease Mother    Heart disease Father    Prostate cancer Father     Past Medical History:  Diagnosis Date   Abnormal glucose 05/26/2019   Acid reflux    Acute bronchitis 08/05/2022   Acute pulmonary embolism (HCC) 01/21/2020   Age-related nuclear cataract of both eyes 03/07/2017   Anemia 08/05/2022   Antalgic gait  04/04/2023   Breast lump on left side at 4 o'clock position 06/28/2018   CAP (community acquired pneumonia) 08/05/2022   Chronic pain of both knees 11/02/2018   Class 3 severe obesity without serious comorbidity with body mass index (BMI) of 50.0 to 59.9 in adult (HCC) 01/26/2020   Complete tear of right rotator cuff 11/12/2016   Cough 01/26/2020   Diabetes mellitus type 2 without retinopathy (HCC) 03/07/2017   Diabetes mellitus without complication (HCC)    Pt states she is prediabetic   DVT of deep femoral vein, left (HCC) 01/26/2020   Essential hypertension 06/28/2018   Fall from slipping on ice 07/26/2019   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   Functional urinary incontinence 06/28/2018   Hypertension    Mixed hyperlipidemia 05/26/2019   Myopia with astigmatism and presbyopia, bilateral 03/07/2017   OSA (obstructive sleep apnea)    Primary localized osteoarthrosis of the knee, right 11/12/2016   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     albuterol  (VENTOLIN  HFA) 108 (90 Base) MCG/ACT inhaler Inhale 1-2 puffs into the lungs every 6 (six) hours as needed for wheezing or shortness of breath. 6.7 g 0   apixaban  (ELIQUIS ) 5 MG TABS tablet Take 1 tablet (5 mg total) by mouth 2 (two) times daily. 60 tablet 11   cyclobenzaprine (FLEXERIL) 5 MG tablet Take 5 mg by mouth 3 (three) times daily as needed for muscle spasms.     empagliflozin  (JARDIANCE ) 10 MG TABS tablet Take 1 tablet (10 mg total) by mouth daily. 30 tablet 3   ezetimibe -simvastatin  (VYTORIN ) 10-40 MG tablet Take 1 tablet by mouth daily. 90 tablet 1   Fe Fum-FePoly-FA-Vit C-Vit B3 (INTEGRA F )  125-1 MG CAPS Take 1 tablet by mouth daily. 30 capsule 2   Furosemide  (FUROSCIX ) 80 MG/10ML CTKT Inject 80 mg into the skin daily for 3 doses. 4 each 0   hydrALAZINE  (APRESOLINE ) 25 MG tablet Take 0.5 tablets (12.5 mg total) by mouth 3 (three) times daily. 90 tablet 3   loratadine  (CLARITIN ) 10 MG tablet Take 1 tablet (10 mg total) by mouth daily as needed for allergies. 15 tablet 0   methocarbamol  (ROBAXIN ) 750 MG tablet Take 750 mg by mouth 2 (two) times daily as needed for muscle spasms.     metolazone  (ZAROXOLYN ) 5 MG tablet Take 1 tablet (5 mg total) by mouth daily. 5 tablet 3  metoprolol  succinate (TOPROL -XL) 50 MG 24 hr tablet Take 1 tablet (50 mg total) by mouth daily. Take with or immediately following a meal. 30 tablet 0   MOUNJARO 5 MG/0.5ML Pen Inject 5 mg into the skin every Friday.     Multiple Vitamins-Minerals (EMERGEN-C VITAMIN C) PACK Take 1 packet by mouth daily as needed (immune support).     omeprazole  (PRILOSEC) 40 MG capsule Take 40 mg by mouth daily.     OVER THE COUNTER MEDICATION Take 1 tablet by mouth daily as needed (congested lymphs). OTC natural lymph supplment     potassium chloride  SA (KLOR-CON  M) 20 MEQ tablet Take 2 tablets (40 mEq total) by mouth daily. 180 tablet 3   torsemide  (DEMADEX ) 20 MG tablet Take 3 tablets (60 mg total) by mouth 2 (two) times daily. 90 tablet 3   traMADol  (ULTRAM ) 50 MG tablet Take 1 tablet (50 mg total) by mouth every 6 (six) hours as needed. 30 tablet    Vitamin D , Ergocalciferol , (DRISDOL ) 1.25 MG (50000 UNIT) CAPS capsule Take 1 capsule (50,000 Units total) by mouth every 7 (seven) days. 12 capsule 0   [Paused] losartan -hydrochlorothiazide  (HYZAAR) 100-25 MG tablet Take 1 tablet by mouth daily. (Patient not taking: Reported on 06/21/2024)     MYRBETRIQ 50 MG TB24 tablet Take 50 mg by mouth daily. (Patient not taking: Reported on 06/21/2024)     solifenacin  (VESICARE ) 10 MG tablet Take 1 tablet by mouth once daily (Patient not  taking: Reported on 06/21/2024) 30 tablet 0   No current facility-administered medications for this encounter.    Vitals:   06/21/24 1442  BP: (!) 154/96  Pulse: 87  SpO2: 97%  Weight: (!) 172.7 kg (380 lb 12.8 oz)    PHYSICAL EXAM: General:  obese appearing.  No respiratory difficulty. Arrived in [facility] Neck: JVD difficult to see, does not appear elevated.  Cor: Regular rate & rhythm. No murmurs. Lungs: clear Extremities: trace RLE edema  Neuro: alert & oriented x 3. Affect pleasant.   Wt Readings from Last 3 Encounters:  06/21/24 (!) 172.7 kg (380 lb 12.8 oz)  06/08/24 (!) 172.1 kg (379 lb 6.4 oz)  05/31/24 133.4 kg (294 lb)   ECG: none today  ASSESSMENT & PLAN: Chronic HFmrEF - 45-50%, G1DD, mildly reduced RV, mod MR, mild AS - NYHA IIIb. - Weight unchanged but swelling much better. Continue Torsemide  60 mg BID. BMET/BNP today.  - GDMT: ? blocker: continue toprol  XL 50 mg daily ARB/ARNI: holding hyzaar with renal function MRA: limited by Cr SGLT2i: Continue jardiance  10 mg daily Vasoldilators: Start hydral 12.5 mg TID, did not pick up after last visit, stated she will pick up this time.    MR - mod on echo 10/25 - suspect functional   AS - mild, stable   Ascending aortic aneurysm - 4.2 cm on echo - need annual surveillance by CT   CKD 3b - Baseline Cr appears around 1.5-1.6.  - Last 2.08 - BMET/BNP today   HTN  - BP elevated today - plan as above   OSA - Reports compliance with CPAP since she got the mask last week.    Obesity - Body mass index is 61.46 kg/m. - on Mounjaro, plans on asking PCP for dose increase next week.    H/o DVT - on eliquis ; finally taking BID. Denies abnormal bleeding.    During the last visit we provided her with scale, pill bow and pill splitter.    Referred to  [facility] during last visit, follow up as scheduled in January.

## 2024-06-21 NOTE — Patient Instructions (Signed)
 PLEASE start Your Hydralazine  12.5 mg Three times a day  Labs done today, your results will be available in MyChart, we will contact you for abnormal readings.  Thank you for allowing us  to provider your heart failure care after your recent hospitalization. Please follow-up with Dr. Revnakar as scheduled.   At the [facility], you and your health needs are our priority. As part of our continuing mission to provide you with exceptional heart care, we have created designated Provider Care Teams. These Care Teams include your primary Cardiologist (physician) and Advanced Practice Providers (APPs- Physician Assistants and Nurse Practitioners) who all work together to provide you with the care you need, when you need it.   You may see any of the following providers on your designated Care Team at your next follow up: Dr Toribio Fuel Dr Ezra Shuck Dr. Morene Brownie Greig Mosses, NP Caffie Shed, [place] [facility] [place], [place] Beckey Coe, NP Jordan Lee, NP Ellouise Class, NP Tinnie Redman, PharmD Jaun Bash, PharmD   Please be sure to bring in all your medications bottles to every appointment.    Thank you for choosing [facility]

## 2024-06-29 ENCOUNTER — Ambulatory Visit: Payer: Self-pay | Admitting: Pulmonary Disease

## 2024-06-29 NOTE — Telephone Encounter (Signed)
 FYI Only or Action Required?: Action required by provider: clinical question for provider.  Patient is followed in Pulmonology for n/a, last seen on 11/04/2023 by Neda Jennet LABOR, MD.  Called Nurse Triage reporting Cough.  Symptoms began Thursday.  Interventions attempted: Nothing.  Symptoms are: gradually worsening.  Triage Disposition: See PCP When Office is Open (Within 3 Days)  Patient/caregiver understands and will follow disposition?: No, wishes to speak with PCP      Copied from CRM #[id number]. Topic: Clinical - Red Word Triage >> Jun 29, 2024  3:23 PM Joan [name] wrote: Red Word that prompted transfer to Nurse Triage: SOB/Discoloration of Mucus: Patient is also requesting zpak/prednisone  for cold with yellow mucus. She has not been able to get up the mucus. SOB when walking. She said she is prone to pneumonia so would like to get this under control. Reason for Disposition  Cough has been present for > 3 weeks  Answer Assessment - Initial Assessment Questions 1. ONSET: When did the cough begin?      Thursday 2. SEVERITY: How bad is the cough today?      Moderate 3. SPUTUM: Describe the color of your sputum (e.g., none, dry cough; clear, white, yellow, green)     yellow 4. HEMOPTYSIS: Are you coughing up any blood? If Yes, ask: How much? (e.g., flecks, streaks, tablespoons, etc.)     no 5. DIFFICULTY BREATHING: Are you having difficulty breathing? If Yes, ask: How bad is it? (e.g., mild, moderate, severe)      no 6. FEVER: Do you have a fever? If Yes, ask: What is your temperature, how was it measured, and when did it start?     no 7. CARDIAC HISTORY: Do you have any history of heart disease? (e.g., heart attack, congestive heart failure)      na 8. LUNG HISTORY: Do you have any history of lung disease?  (e.g., pulmonary embolus, asthma, emphysema)     bronchitis 9. PE RISK FACTORS: Do you have a history of blood clots? (or: recent major surgery,  recent prolonged travel, bedridden)     na 10. OTHER SYMPTOMS: Do you have any other symptoms? (e.g., runny nose, wheezing, chest pain)       Wheezing at night, SOB w/exertion,stuffy & runny nose 11. PREGNANCY: Is there any chance you are pregnant? When was your last menstrual period?       no 12. TRAVEL: Have you traveled out of the country in the last month? (e.g., travel history, exposures)       Na  Patient is requesting zpak and prednisone  to hopefully prevent pneumnia  Protocols used: Cough - Acute Productive-A-AH

## 2024-06-29 NOTE — Telephone Encounter (Signed)
 Pt is requesting ABX and prednisone  by sent to [facility] on [address] in [place], [place]

## 2024-06-30 ENCOUNTER — Other Ambulatory Visit: Payer: Self-pay | Admitting: Pulmonary Disease

## 2024-06-30 MED ORDER — PREDNISONE 20 MG PO TABS: 40.0000 mg | ORAL_TABLET | Freq: Every day | ORAL | 0 refills | Status: AC

## 2024-06-30 MED ORDER — DOXYCYCLINE HYCLATE 100 MG PO TABS: 100.0000 mg | ORAL_TABLET | Freq: Two times a day (BID) | ORAL | 0 refills | Status: AC

## 2024-06-30 NOTE — Telephone Encounter (Signed)
 Please advise patient requesting abx for cough

## 2024-07-13 ENCOUNTER — Other Ambulatory Visit (HOSPITAL_COMMUNITY): Payer: Self-pay | Admitting: Cardiology

## 2024-07-13 MED ORDER — POTASSIUM CHLORIDE 20 MEQ PO PACK: 40.0000 meq | PACK | Freq: Every day | ORAL | 3 refills | Status: AC

## 2024-07-13 MED ORDER — METOPROLOL SUCCINATE ER 50 MG PO TB24: 50.0000 mg | ORAL_TABLET | Freq: Every day | ORAL | 3 refills | Status: AC

## 2024-07-29 ENCOUNTER — Other Ambulatory Visit: Payer: Self-pay

## 2024-07-29 DIAGNOSIS — K219 Gastro-esophageal reflux disease without esophagitis: Secondary | ICD-10-CM | POA: Insufficient documentation

## 2024-07-29 DIAGNOSIS — G4733 Obstructive sleep apnea (adult) (pediatric): Secondary | ICD-10-CM | POA: Insufficient documentation

## 2024-07-29 DIAGNOSIS — I10 Essential (primary) hypertension: Secondary | ICD-10-CM | POA: Insufficient documentation

## 2024-08-12 ENCOUNTER — Ambulatory Visit: Admitting: Cardiology

## 2024-09-08 ENCOUNTER — Ambulatory Visit: Admitting: Cardiology
